# Patient Record
Sex: Female | Born: 1950 | Race: Black or African American | Hispanic: No | Marital: Married | Smoking: Former smoker
Health system: Southern US, Community
[De-identification: ages and names within clinical notes are randomized; demographics above are authoritative.]

## PROBLEM LIST (undated history)

## (undated) DIAGNOSIS — I10 Essential (primary) hypertension: Secondary | ICD-10-CM

## (undated) DIAGNOSIS — Z8711 Personal history of peptic ulcer disease: Secondary | ICD-10-CM

## (undated) DIAGNOSIS — M549 Dorsalgia, unspecified: Secondary | ICD-10-CM

## (undated) DIAGNOSIS — R3915 Urgency of urination: Secondary | ICD-10-CM

## (undated) DIAGNOSIS — R350 Frequency of micturition: Secondary | ICD-10-CM

## (undated) DIAGNOSIS — H269 Unspecified cataract: Secondary | ICD-10-CM

## (undated) DIAGNOSIS — F4310 Post-traumatic stress disorder, unspecified: Secondary | ICD-10-CM

## (undated) DIAGNOSIS — R351 Nocturia: Secondary | ICD-10-CM

## (undated) DIAGNOSIS — R51 Headache: Secondary | ICD-10-CM

## (undated) DIAGNOSIS — E559 Vitamin D deficiency, unspecified: Secondary | ICD-10-CM

## (undated) DIAGNOSIS — K5790 Diverticulosis of intestine, part unspecified, without perforation or abscess without bleeding: Secondary | ICD-10-CM

## (undated) DIAGNOSIS — G894 Chronic pain syndrome: Secondary | ICD-10-CM

## (undated) DIAGNOSIS — C801 Malignant (primary) neoplasm, unspecified: Secondary | ICD-10-CM

## (undated) DIAGNOSIS — E119 Type 2 diabetes mellitus without complications: Secondary | ICD-10-CM

## (undated) DIAGNOSIS — G8929 Other chronic pain: Secondary | ICD-10-CM

## (undated) DIAGNOSIS — M1990 Unspecified osteoarthritis, unspecified site: Secondary | ICD-10-CM

## (undated) DIAGNOSIS — M797 Fibromyalgia: Secondary | ICD-10-CM

## (undated) DIAGNOSIS — Z9289 Personal history of other medical treatment: Secondary | ICD-10-CM

## (undated) DIAGNOSIS — J45909 Unspecified asthma, uncomplicated: Secondary | ICD-10-CM

## (undated) DIAGNOSIS — Z8719 Personal history of other diseases of the digestive system: Secondary | ICD-10-CM

## (undated) DIAGNOSIS — M2550 Pain in unspecified joint: Secondary | ICD-10-CM

## (undated) DIAGNOSIS — K219 Gastro-esophageal reflux disease without esophagitis: Secondary | ICD-10-CM

## (undated) DIAGNOSIS — Z860100 Personal history of colon polyps, unspecified: Secondary | ICD-10-CM

## (undated) DIAGNOSIS — N393 Stress incontinence (female) (male): Secondary | ICD-10-CM

## (undated) DIAGNOSIS — Z8669 Personal history of other diseases of the nervous system and sense organs: Secondary | ICD-10-CM

## (undated) DIAGNOSIS — E785 Hyperlipidemia, unspecified: Secondary | ICD-10-CM

## (undated) DIAGNOSIS — M2540 Effusion, unspecified joint: Secondary | ICD-10-CM

## (undated) DIAGNOSIS — R519 Headache, unspecified: Secondary | ICD-10-CM

## (undated) DIAGNOSIS — G629 Polyneuropathy, unspecified: Secondary | ICD-10-CM

## (undated) DIAGNOSIS — J302 Other seasonal allergic rhinitis: Secondary | ICD-10-CM

## (undated) DIAGNOSIS — J189 Pneumonia, unspecified organism: Secondary | ICD-10-CM

## (undated) DIAGNOSIS — J449 Chronic obstructive pulmonary disease, unspecified: Secondary | ICD-10-CM

## (undated) DIAGNOSIS — M25473 Effusion, unspecified ankle: Secondary | ICD-10-CM

## (undated) DIAGNOSIS — K759 Inflammatory liver disease, unspecified: Secondary | ICD-10-CM

## (undated) DIAGNOSIS — D509 Iron deficiency anemia, unspecified: Secondary | ICD-10-CM

## (undated) HISTORY — PX: ABDOMINAL HYSTERECTOMY: SHX81

## (undated) HISTORY — PX: TUBAL LIGATION: SHX77

## (undated) HISTORY — PX: BREAST SURGERY: SHX581

## (undated) HISTORY — PX: COLONOSCOPY: SHX174

## (undated) HISTORY — PX: OTHER SURGICAL HISTORY: SHX169

---

## 1998-07-29 ENCOUNTER — Emergency Department (HOSPITAL_COMMUNITY): Admission: EM | Admit: 1998-07-29 | Discharge: 1998-07-29 | Payer: Self-pay | Admitting: Emergency Medicine

## 1998-10-05 ENCOUNTER — Ambulatory Visit (HOSPITAL_COMMUNITY): Admission: RE | Admit: 1998-10-05 | Discharge: 1998-10-05 | Payer: Self-pay | Admitting: Family Medicine

## 1999-04-29 ENCOUNTER — Emergency Department (HOSPITAL_COMMUNITY): Admission: EM | Admit: 1999-04-29 | Discharge: 1999-04-29 | Payer: Self-pay | Admitting: Emergency Medicine

## 1999-04-29 ENCOUNTER — Encounter: Payer: Self-pay | Admitting: Emergency Medicine

## 1999-06-13 ENCOUNTER — Ambulatory Visit (HOSPITAL_COMMUNITY): Admission: RE | Admit: 1999-06-13 | Discharge: 1999-06-13 | Payer: Self-pay | Admitting: Internal Medicine

## 1999-06-13 ENCOUNTER — Encounter: Payer: Self-pay | Admitting: Internal Medicine

## 2001-03-23 ENCOUNTER — Emergency Department (HOSPITAL_COMMUNITY): Admission: EM | Admit: 2001-03-23 | Discharge: 2001-03-23 | Payer: Self-pay | Admitting: Emergency Medicine

## 2001-03-23 ENCOUNTER — Encounter: Payer: Self-pay | Admitting: Emergency Medicine

## 2003-03-31 ENCOUNTER — Ambulatory Visit (HOSPITAL_COMMUNITY): Admission: RE | Admit: 2003-03-31 | Discharge: 2003-03-31 | Payer: Self-pay | Admitting: Family Medicine

## 2003-04-29 ENCOUNTER — Encounter: Payer: Self-pay | Admitting: Family Medicine

## 2003-04-29 ENCOUNTER — Encounter: Admission: RE | Admit: 2003-04-29 | Discharge: 2003-04-29 | Payer: Self-pay | Admitting: Family Medicine

## 2003-05-02 ENCOUNTER — Encounter: Payer: Self-pay | Admitting: Family Medicine

## 2003-05-02 ENCOUNTER — Encounter: Admission: RE | Admit: 2003-05-02 | Discharge: 2003-05-02 | Payer: Self-pay | Admitting: Family Medicine

## 2003-05-12 ENCOUNTER — Encounter: Admission: RE | Admit: 2003-05-12 | Discharge: 2003-05-12 | Payer: Self-pay | Admitting: Occupational Medicine

## 2003-05-12 ENCOUNTER — Encounter: Payer: Self-pay | Admitting: Occupational Medicine

## 2004-04-17 ENCOUNTER — Ambulatory Visit (HOSPITAL_COMMUNITY): Admission: RE | Admit: 2004-04-17 | Discharge: 2004-04-17 | Payer: Self-pay | Admitting: Family Medicine

## 2004-04-20 ENCOUNTER — Ambulatory Visit (HOSPITAL_COMMUNITY): Admission: RE | Admit: 2004-04-20 | Discharge: 2004-04-20 | Payer: Self-pay | Admitting: Family Medicine

## 2004-05-03 ENCOUNTER — Ambulatory Visit (HOSPITAL_COMMUNITY): Admission: RE | Admit: 2004-05-03 | Discharge: 2004-05-03 | Payer: Self-pay | Admitting: Family Medicine

## 2004-10-22 ENCOUNTER — Ambulatory Visit: Payer: Self-pay | Admitting: *Deleted

## 2004-11-05 ENCOUNTER — Ambulatory Visit: Payer: Self-pay | Admitting: Family Medicine

## 2004-11-22 ENCOUNTER — Ambulatory Visit: Payer: Self-pay | Admitting: Family Medicine

## 2005-01-09 ENCOUNTER — Ambulatory Visit: Payer: Self-pay | Admitting: Internal Medicine

## 2005-02-15 ENCOUNTER — Ambulatory Visit: Payer: Self-pay | Admitting: Family Medicine

## 2005-02-27 ENCOUNTER — Ambulatory Visit: Payer: Self-pay | Admitting: Family Medicine

## 2005-03-06 ENCOUNTER — Ambulatory Visit: Payer: Self-pay | Admitting: Family Medicine

## 2005-03-07 ENCOUNTER — Ambulatory Visit (HOSPITAL_COMMUNITY): Admission: RE | Admit: 2005-03-07 | Discharge: 2005-03-07 | Payer: Self-pay | Admitting: Family Medicine

## 2005-03-20 ENCOUNTER — Ambulatory Visit: Payer: Self-pay | Admitting: Family Medicine

## 2005-03-28 ENCOUNTER — Ambulatory Visit: Payer: Self-pay | Admitting: Family Medicine

## 2005-04-09 ENCOUNTER — Ambulatory Visit: Payer: Self-pay | Admitting: Family Medicine

## 2005-04-11 ENCOUNTER — Encounter: Admission: RE | Admit: 2005-04-11 | Discharge: 2005-04-11 | Payer: Self-pay | Admitting: Family Medicine

## 2005-05-15 ENCOUNTER — Ambulatory Visit: Payer: Self-pay | Admitting: Family Medicine

## 2005-05-24 ENCOUNTER — Ambulatory Visit: Payer: Self-pay | Admitting: Internal Medicine

## 2005-05-24 ENCOUNTER — Encounter: Payer: Self-pay | Admitting: Cardiology

## 2005-05-24 ENCOUNTER — Inpatient Hospital Stay (HOSPITAL_COMMUNITY): Admission: EM | Admit: 2005-05-24 | Discharge: 2005-05-27 | Payer: Self-pay | Admitting: Emergency Medicine

## 2005-05-25 ENCOUNTER — Ambulatory Visit: Payer: Self-pay | Admitting: Cardiology

## 2005-05-31 ENCOUNTER — Ambulatory Visit: Payer: Self-pay | Admitting: Family Medicine

## 2005-07-13 ENCOUNTER — Emergency Department (HOSPITAL_COMMUNITY): Admission: EM | Admit: 2005-07-13 | Discharge: 2005-07-13 | Payer: Self-pay | Admitting: Family Medicine

## 2005-07-23 ENCOUNTER — Ambulatory Visit: Payer: Self-pay | Admitting: Family Medicine

## 2005-08-26 ENCOUNTER — Emergency Department (HOSPITAL_COMMUNITY): Admission: EM | Admit: 2005-08-26 | Discharge: 2005-08-26 | Payer: Self-pay | Admitting: Family Medicine

## 2005-08-31 ENCOUNTER — Emergency Department (HOSPITAL_COMMUNITY): Admission: EM | Admit: 2005-08-31 | Discharge: 2005-08-31 | Payer: Self-pay | Admitting: Emergency Medicine

## 2005-09-07 ENCOUNTER — Emergency Department (HOSPITAL_COMMUNITY): Admission: EM | Admit: 2005-09-07 | Discharge: 2005-09-07 | Payer: Self-pay | Admitting: Family Medicine

## 2005-12-20 ENCOUNTER — Emergency Department (HOSPITAL_COMMUNITY): Admission: EM | Admit: 2005-12-20 | Discharge: 2005-12-20 | Payer: Self-pay | Admitting: Family Medicine

## 2006-04-21 ENCOUNTER — Emergency Department (HOSPITAL_COMMUNITY): Admission: EM | Admit: 2006-04-21 | Discharge: 2006-04-21 | Payer: Self-pay | Admitting: Emergency Medicine

## 2006-10-15 ENCOUNTER — Emergency Department (HOSPITAL_COMMUNITY): Admission: EM | Admit: 2006-10-15 | Discharge: 2006-10-15 | Payer: Self-pay | Admitting: Emergency Medicine

## 2007-01-13 ENCOUNTER — Ambulatory Visit: Payer: Self-pay | Admitting: Family Medicine

## 2007-01-21 ENCOUNTER — Ambulatory Visit (HOSPITAL_COMMUNITY): Admission: RE | Admit: 2007-01-21 | Discharge: 2007-01-21 | Payer: Self-pay | Admitting: Family Medicine

## 2007-01-22 ENCOUNTER — Ambulatory Visit: Payer: Self-pay | Admitting: Internal Medicine

## 2007-03-16 ENCOUNTER — Ambulatory Visit: Payer: Self-pay | Admitting: Family Medicine

## 2007-04-17 ENCOUNTER — Ambulatory Visit: Payer: Self-pay | Admitting: Family Medicine

## 2007-04-17 ENCOUNTER — Encounter (INDEPENDENT_AMBULATORY_CARE_PROVIDER_SITE_OTHER): Payer: Self-pay | Admitting: Family Medicine

## 2007-05-20 ENCOUNTER — Ambulatory Visit: Payer: Self-pay | Admitting: Family Medicine

## 2007-06-11 ENCOUNTER — Ambulatory Visit: Payer: Self-pay | Admitting: Internal Medicine

## 2007-06-30 ENCOUNTER — Ambulatory Visit: Payer: Self-pay | Admitting: Family Medicine

## 2007-07-28 ENCOUNTER — Ambulatory Visit: Payer: Self-pay | Admitting: Family Medicine

## 2007-08-06 ENCOUNTER — Emergency Department (HOSPITAL_COMMUNITY): Admission: EM | Admit: 2007-08-06 | Discharge: 2007-08-06 | Payer: Self-pay | Admitting: Emergency Medicine

## 2007-08-25 ENCOUNTER — Ambulatory Visit: Payer: Self-pay | Admitting: Family Medicine

## 2007-09-09 ENCOUNTER — Encounter (INDEPENDENT_AMBULATORY_CARE_PROVIDER_SITE_OTHER): Payer: Self-pay | Admitting: *Deleted

## 2007-09-28 ENCOUNTER — Ambulatory Visit: Payer: Self-pay | Admitting: Internal Medicine

## 2007-10-09 ENCOUNTER — Ambulatory Visit (HOSPITAL_COMMUNITY): Admission: RE | Admit: 2007-10-09 | Discharge: 2007-10-09 | Payer: Self-pay | Admitting: Internal Medicine

## 2007-10-26 ENCOUNTER — Ambulatory Visit: Payer: Self-pay | Admitting: Family Medicine

## 2007-11-16 ENCOUNTER — Emergency Department (HOSPITAL_COMMUNITY): Admission: EM | Admit: 2007-11-16 | Discharge: 2007-11-16 | Payer: Self-pay | Admitting: Emergency Medicine

## 2007-11-20 ENCOUNTER — Emergency Department (HOSPITAL_COMMUNITY): Admission: EM | Admit: 2007-11-20 | Discharge: 2007-11-20 | Payer: Self-pay | Admitting: Family Medicine

## 2007-11-23 ENCOUNTER — Ambulatory Visit: Payer: Self-pay | Admitting: Family Medicine

## 2008-01-13 ENCOUNTER — Emergency Department (HOSPITAL_COMMUNITY): Admission: EM | Admit: 2008-01-13 | Discharge: 2008-01-13 | Payer: Self-pay | Admitting: Emergency Medicine

## 2008-01-21 ENCOUNTER — Ambulatory Visit: Payer: Self-pay | Admitting: Internal Medicine

## 2008-01-28 ENCOUNTER — Ambulatory Visit: Payer: Self-pay | Admitting: Internal Medicine

## 2008-04-25 ENCOUNTER — Encounter (INDEPENDENT_AMBULATORY_CARE_PROVIDER_SITE_OTHER): Payer: Self-pay | Admitting: Family Medicine

## 2008-04-25 ENCOUNTER — Ambulatory Visit: Payer: Self-pay | Admitting: Internal Medicine

## 2008-04-25 LAB — CONVERTED CEMR LAB
Benzodiazepines.: NEGATIVE
Creatinine,U: 211.1 mg/dL
Marijuana Metabolite: NEGATIVE
Methadone: NEGATIVE
Opiate Screen, Urine: NEGATIVE
Propoxyphene: NEGATIVE

## 2008-05-02 ENCOUNTER — Emergency Department (HOSPITAL_COMMUNITY): Admission: EM | Admit: 2008-05-02 | Discharge: 2008-05-03 | Payer: Self-pay | Admitting: Emergency Medicine

## 2008-05-12 ENCOUNTER — Encounter (INDEPENDENT_AMBULATORY_CARE_PROVIDER_SITE_OTHER): Payer: Self-pay | Admitting: Family Medicine

## 2008-05-12 ENCOUNTER — Ambulatory Visit: Payer: Self-pay | Admitting: Internal Medicine

## 2008-05-12 LAB — CONVERTED CEMR LAB
AST: 19 units/L (ref 0–37)
Albumin: 4.4 g/dL (ref 3.5–5.2)
Basophils Absolute: 0 10*3/uL (ref 0.0–0.1)
Basophils Relative: 1 % (ref 0–1)
CO2: 27 meq/L (ref 19–32)
Calcium: 9.4 mg/dL (ref 8.4–10.5)
Creatinine, Ser: 0.69 mg/dL (ref 0.40–1.20)
Eosinophils Absolute: 0.1 10*3/uL (ref 0.0–0.7)
Glucose, Bld: 203 mg/dL — ABNORMAL HIGH (ref 70–99)
HCT: 34.6 % — ABNORMAL LOW (ref 36.0–46.0)
Lymphocytes Relative: 44 % (ref 12–46)
MCHC: 32.7 g/dL (ref 30.0–36.0)
Monocytes Relative: 6 % (ref 3–12)
Neutro Abs: 4.1 10*3/uL (ref 1.7–7.7)
Neutrophils Relative %: 48 % (ref 43–77)
Platelets: 275 10*3/uL (ref 150–400)
TSH: 0.766 microintl units/mL (ref 0.350–5.50)
Total CHOL/HDL Ratio: 2.5
VLDL: 13 mg/dL (ref 0–40)

## 2008-05-14 ENCOUNTER — Ambulatory Visit: Payer: Self-pay | Admitting: Surgery

## 2008-05-14 ENCOUNTER — Encounter (INDEPENDENT_AMBULATORY_CARE_PROVIDER_SITE_OTHER): Payer: Self-pay | Admitting: Emergency Medicine

## 2008-05-14 ENCOUNTER — Emergency Department (HOSPITAL_COMMUNITY): Admission: EM | Admit: 2008-05-14 | Discharge: 2008-05-14 | Payer: Self-pay | Admitting: Emergency Medicine

## 2008-07-07 ENCOUNTER — Emergency Department (HOSPITAL_COMMUNITY): Admission: EM | Admit: 2008-07-07 | Discharge: 2008-07-07 | Payer: Self-pay | Admitting: Emergency Medicine

## 2008-07-19 ENCOUNTER — Ambulatory Visit: Payer: Self-pay | Admitting: Internal Medicine

## 2008-07-21 ENCOUNTER — Emergency Department (HOSPITAL_COMMUNITY): Admission: EM | Admit: 2008-07-21 | Discharge: 2008-07-22 | Payer: Self-pay | Admitting: Emergency Medicine

## 2008-09-08 ENCOUNTER — Ambulatory Visit: Payer: Self-pay | Admitting: Internal Medicine

## 2008-09-08 ENCOUNTER — Inpatient Hospital Stay (HOSPITAL_COMMUNITY): Admission: EM | Admit: 2008-09-08 | Discharge: 2008-09-10 | Payer: Self-pay | Admitting: *Deleted

## 2008-09-09 HISTORY — PX: CARDIAC CATHETERIZATION: SHX172

## 2008-09-13 ENCOUNTER — Ambulatory Visit: Payer: Self-pay | Admitting: Family Medicine

## 2008-09-22 ENCOUNTER — Emergency Department (HOSPITAL_COMMUNITY): Admission: EM | Admit: 2008-09-22 | Discharge: 2008-09-22 | Payer: Self-pay | Admitting: Family Medicine

## 2008-10-06 ENCOUNTER — Inpatient Hospital Stay (HOSPITAL_COMMUNITY): Admission: EM | Admit: 2008-10-06 | Discharge: 2008-10-09 | Payer: Self-pay | Admitting: Emergency Medicine

## 2008-10-25 ENCOUNTER — Ambulatory Visit: Payer: Self-pay | Admitting: Family Medicine

## 2008-10-25 LAB — CONVERTED CEMR LAB
Basophils Absolute: 0 10*3/uL (ref 0.0–0.1)
Cholesterol: 203 mg/dL — ABNORMAL HIGH (ref 0–200)
Creatinine, Ser: 0.66 mg/dL (ref 0.40–1.20)
Eosinophils Absolute: 0.2 10*3/uL (ref 0.0–0.7)
Glucose, Bld: 122 mg/dL — ABNORMAL HIGH (ref 70–99)
Hemoglobin: 10.4 g/dL — ABNORMAL LOW (ref 12.0–15.0)
Lymphs Abs: 2.5 10*3/uL (ref 0.7–4.0)
Monocytes Relative: 6 % (ref 3–12)
Neutro Abs: 3.1 10*3/uL (ref 1.7–7.7)
Neutrophils Relative %: 50 % (ref 43–77)
Platelets: 266 10*3/uL (ref 150–400)
RBC: 3.75 M/uL — ABNORMAL LOW (ref 3.87–5.11)
Triglycerides: 63 mg/dL (ref <150)

## 2008-12-06 ENCOUNTER — Emergency Department (HOSPITAL_COMMUNITY): Admission: EM | Admit: 2008-12-06 | Discharge: 2008-12-06 | Payer: Self-pay | Admitting: Emergency Medicine

## 2008-12-23 DIAGNOSIS — J189 Pneumonia, unspecified organism: Secondary | ICD-10-CM

## 2008-12-23 HISTORY — DX: Pneumonia, unspecified organism: J18.9

## 2009-01-09 ENCOUNTER — Emergency Department (HOSPITAL_COMMUNITY): Admission: EM | Admit: 2009-01-09 | Discharge: 2009-01-09 | Payer: Self-pay | Admitting: Family Medicine

## 2009-01-25 ENCOUNTER — Emergency Department (HOSPITAL_COMMUNITY): Admission: EM | Admit: 2009-01-25 | Discharge: 2009-01-25 | Payer: Self-pay | Admitting: Emergency Medicine

## 2009-01-29 IMAGING — CT CT HEAD W/O CM
1 of 2 series · 13 of 30 positions shown, 17 images · non-contrast
Comparison: None

CLINICAL DATA: Weakness.  Spasms of the upper and lower
extremities.  Unsteady gait.  Nausea.  Disorientation.

CT HEAD WITHOUT CONTRAST
TECHNIQUE: Contiguous axial images were obtained from the base of
the skull through the vertex without contrast.

[Series 2: brain · axial · 0.47mm/px · z∈[-146,-16]mm · 13 of 28 slices shown, 17 images]
[im 2/28  brain]
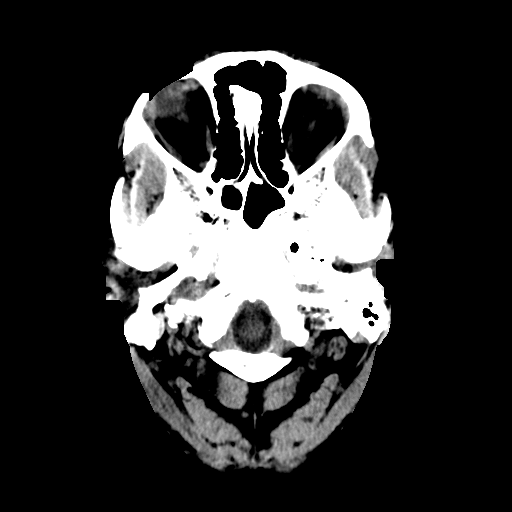
[im 2/28  bone]
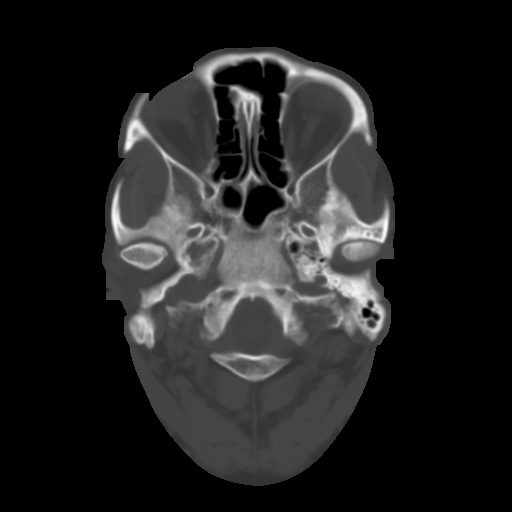
[im 4/28  brain]
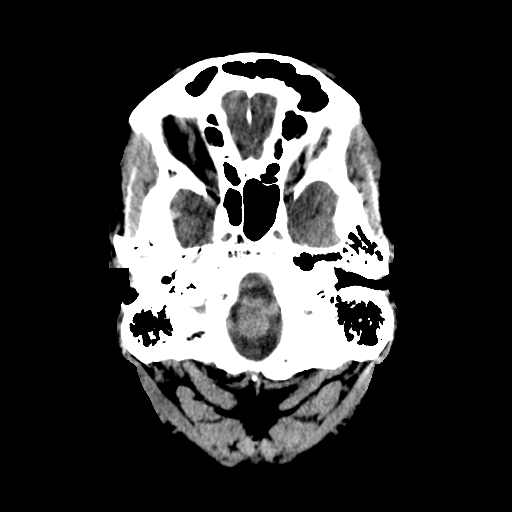
[im 6/28  brain]
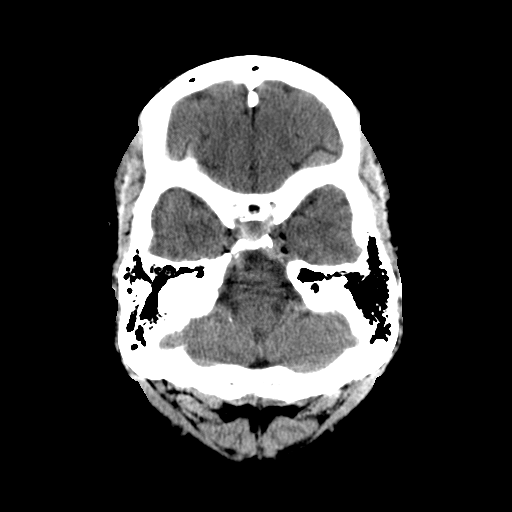
[im 8/28  brain]
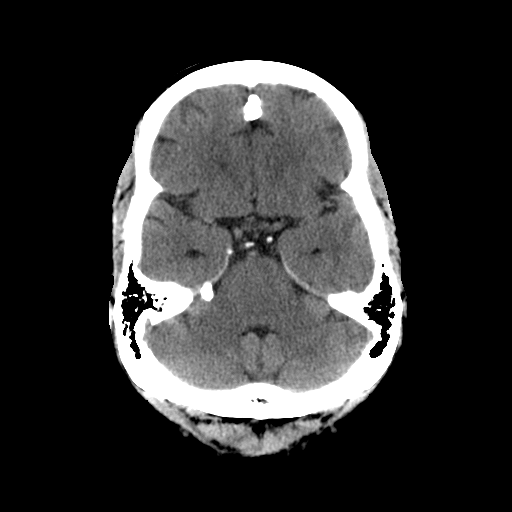
[im 10/28  brain]
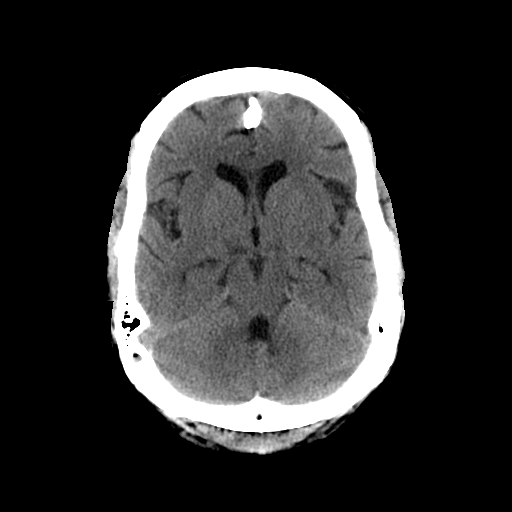
[im 10/28  bone]
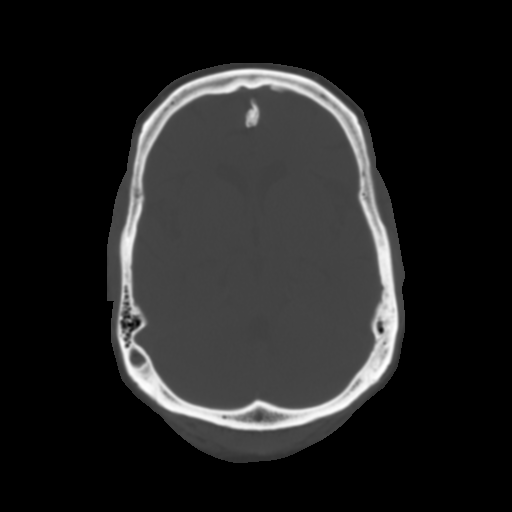
[im 12/28  brain]
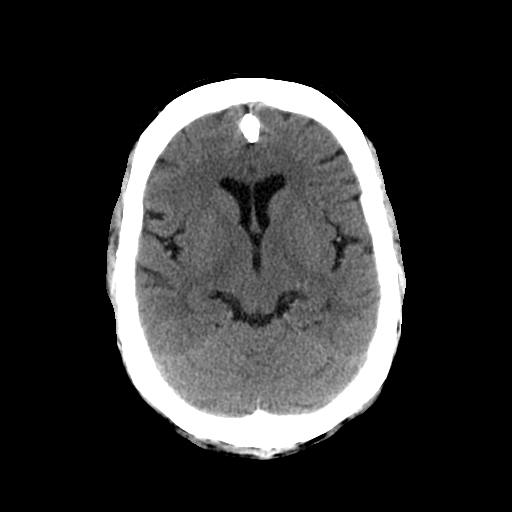
[im 14/28  brain]
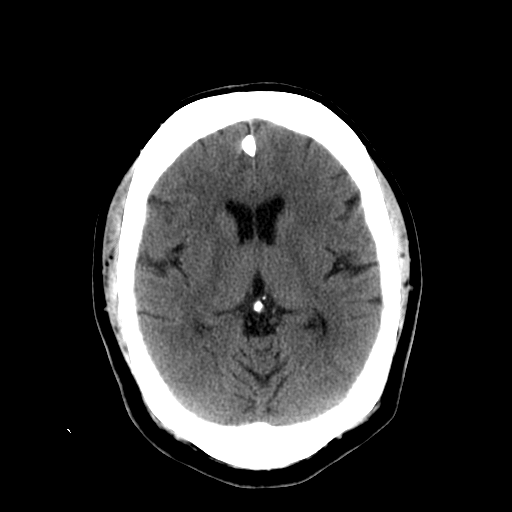
[im 16/28  brain]
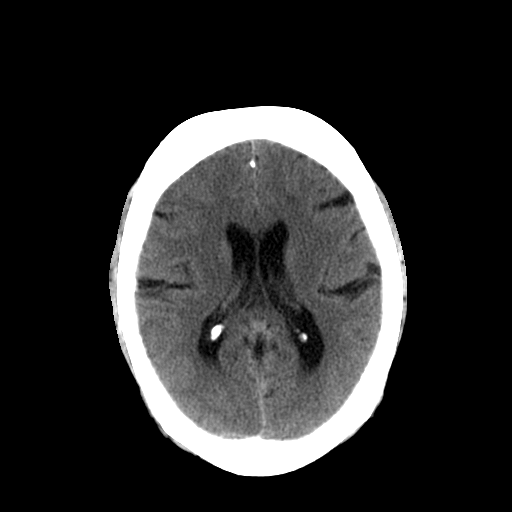
[im 18/28  brain]
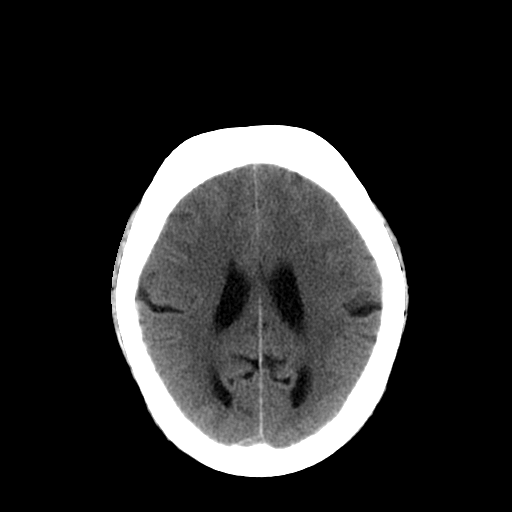
[im 18/28  bone]
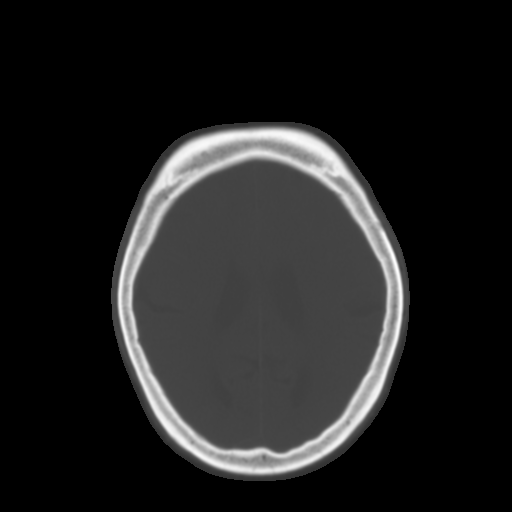
[im 20/28  brain]
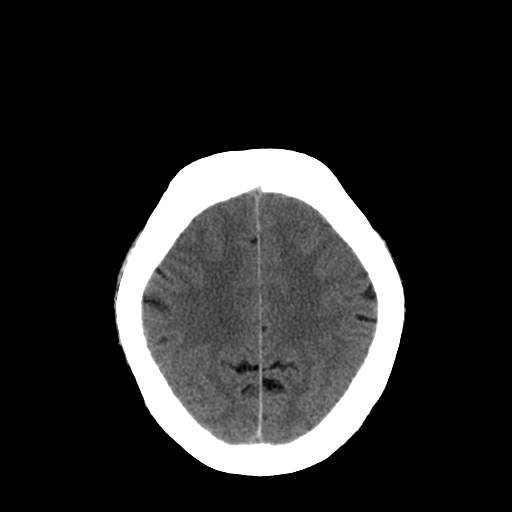
[im 22/28  brain]
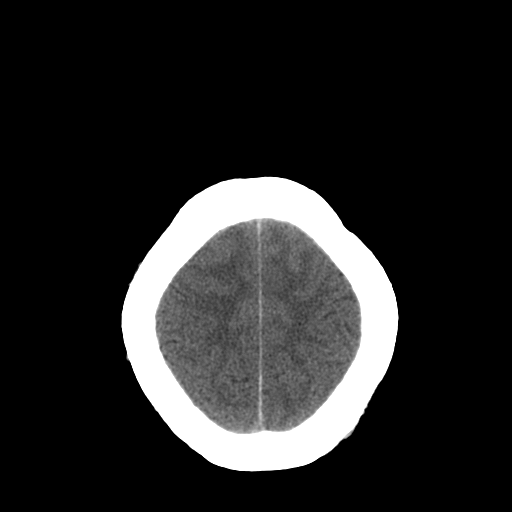
[im 24/28  brain]
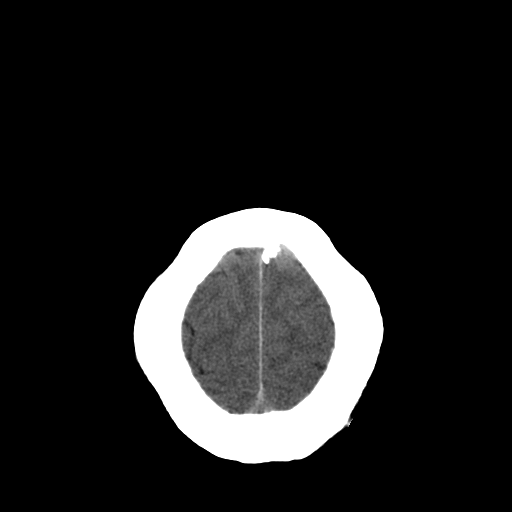
[im 26/28  brain]
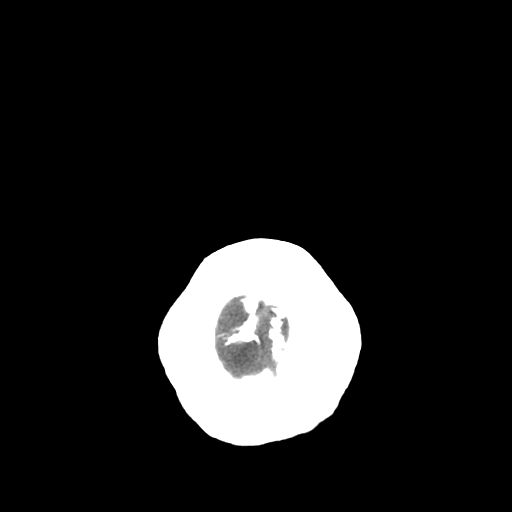
[im 26/28  bone]
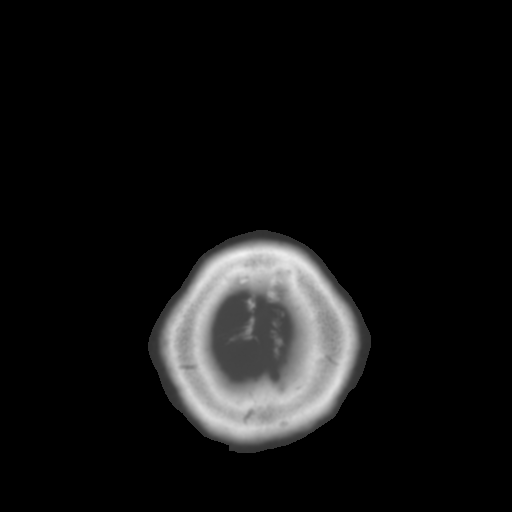

[13 of 30 positions shown; findings below may reference images not displayed]

FINDINGS: The brain stem, cerebellum, cerebral peduncles, thalami,
basal ganglia, basilar cisterns, and ventricular system appear
unremarkable.

There is a relative paucity of sulci at the vertex, but without
findings of sulcal effacement or basilar cisterns effacement
elsewhere in the brain; this appearance of the vertex is likely
incidental.

Ossification noted along the falx anteriorly.

No intracranial hemorrhage, mass lesion, or acute CVA is
identified.

Mucosal thickening in the right sphenoid sinus is compatible with
mild chronic sinusitis.
IMPRESSION: The
1.  Mild chronic sphenoid sinusitis.
2.  No acute intracranial findings are identified. Please note that
acute CVA can be occult on CT scan.

## 2009-01-29 IMAGING — CT CT ABDOMEN W/O CM
2 of 4 series · 14 of 32 positions shown, 19 images · IV contrast (H2O/omni 300)
Comparison: None available

CT ABDOMEN

CLINICAL DATA: acute renal failure, right sided abdominal pain,
sarcoidosis

CT ABDOMEN AND PELVIS WITHOUT CONTRAST
TECHNIQUE: Multidetector CT imaging of the abdomen and pelvis was
performed following the standard protocol without intravenous
contrast.

[Series 2: abd pelvis · axial · 0.98mm/px · z∈[-416,-66]mm · 8 of 91 slices shown, 13 images]
[im 11/91  soft-tissue]
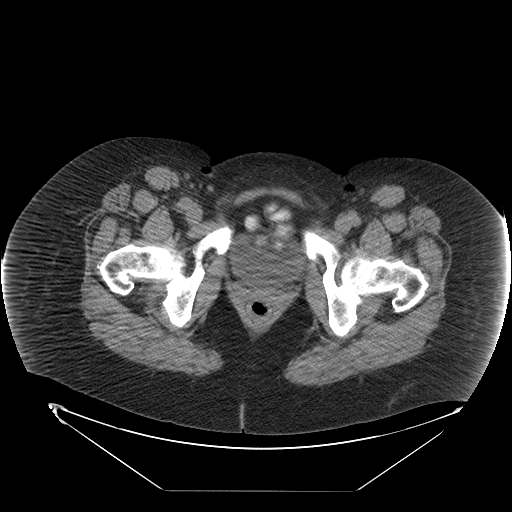
[im 11/91  bone]
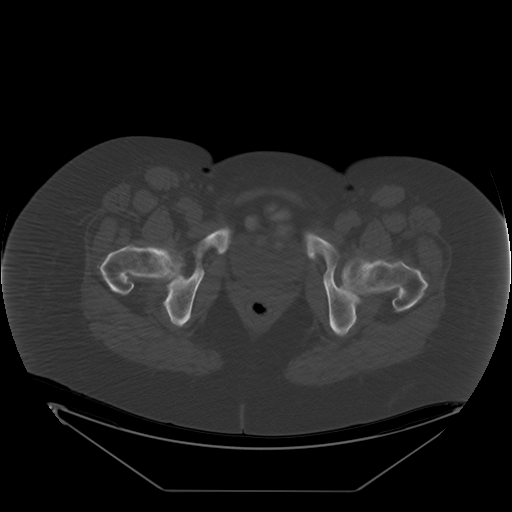
[im 21/91  soft-tissue]
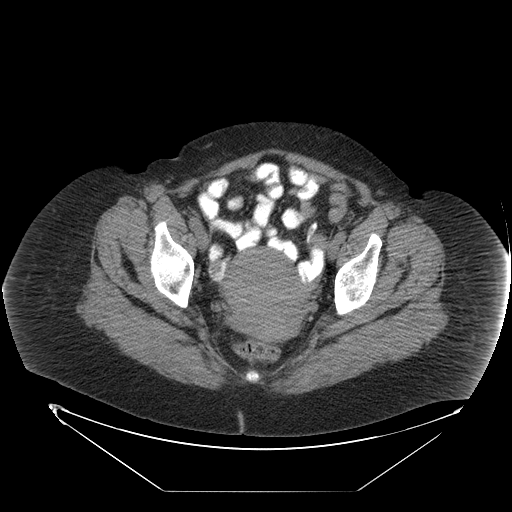
[im 31/91  soft-tissue]
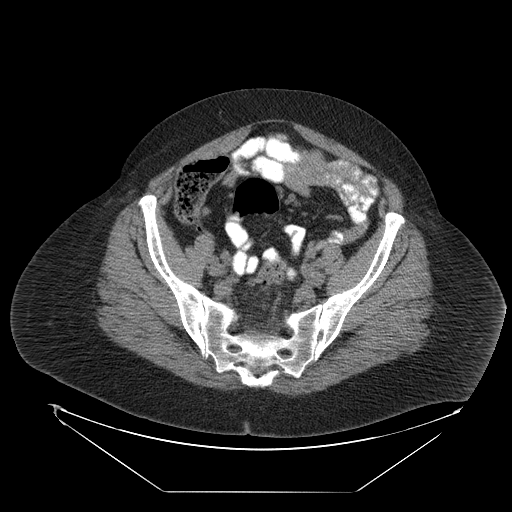
[im 41/91  soft-tissue]
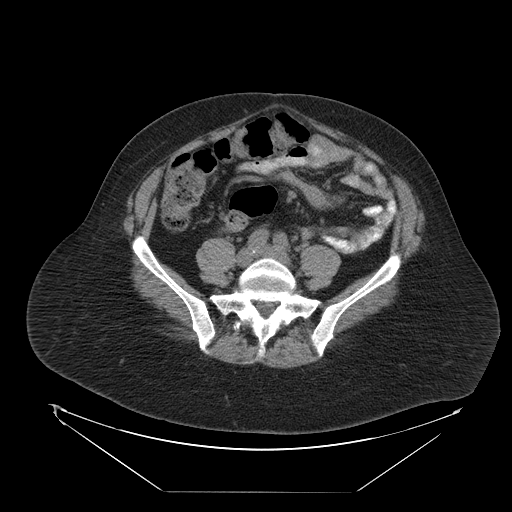
[im 51/91  soft-tissue]
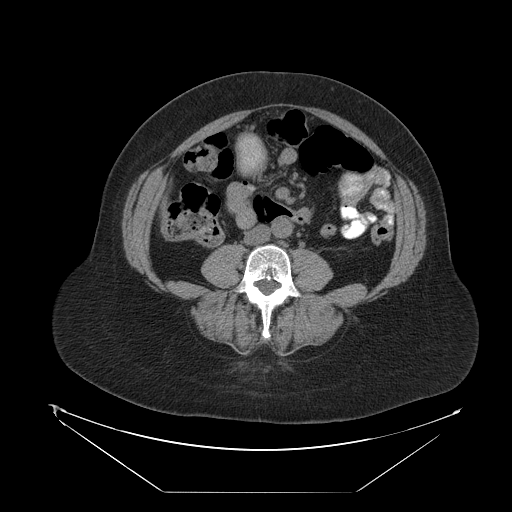
[im 51/91  lung]
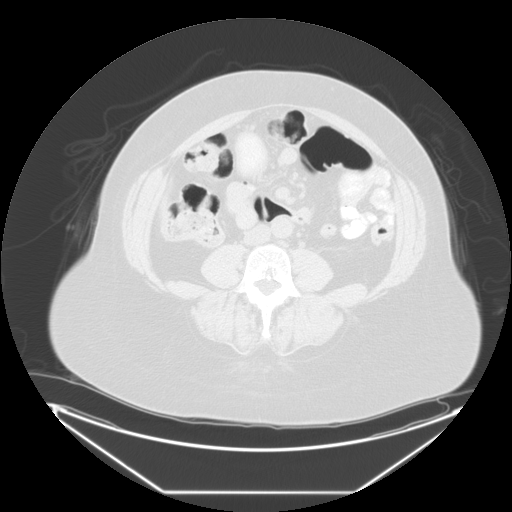
[im 61/91  soft-tissue]
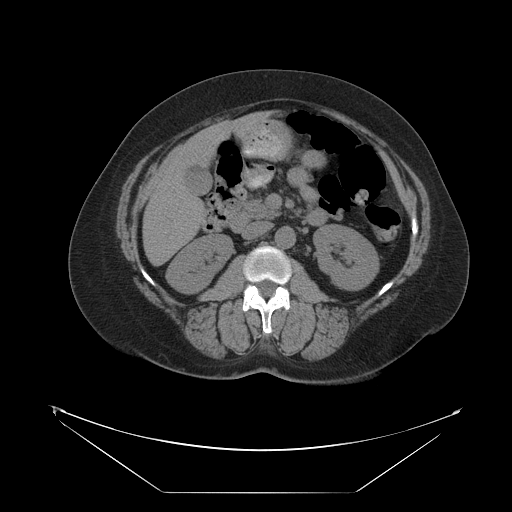
[im 61/91  lung]
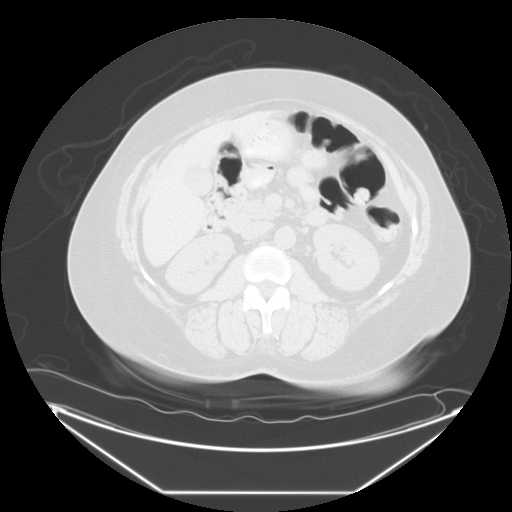
[im 71/91  soft-tissue]
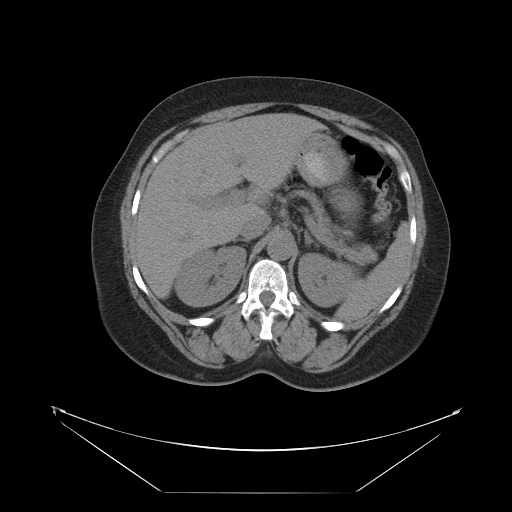
[im 71/91  lung]
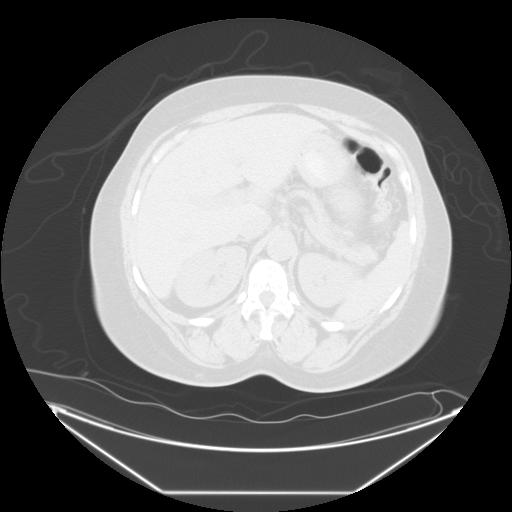
[im 81/91  soft-tissue]
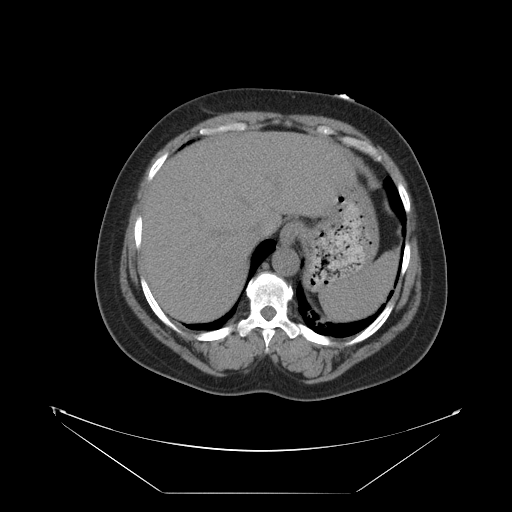
[im 81/91  lung]
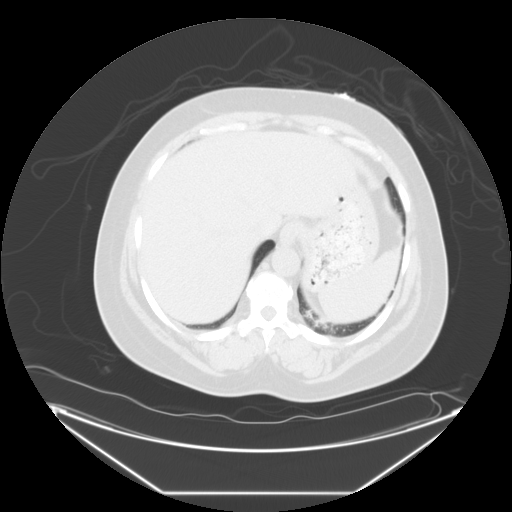

[Series 400: sagittal a/p · sagittal · 0.98mm/px · 6 of 86 slices shown]
[im 10/86  soft-tissue]
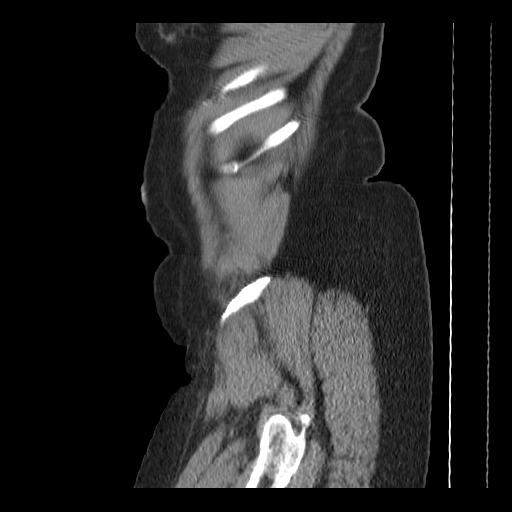
[im 19/86  soft-tissue]
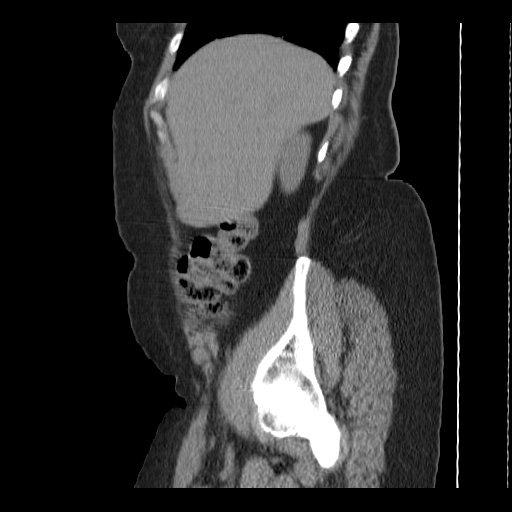
[im 29/86  soft-tissue]
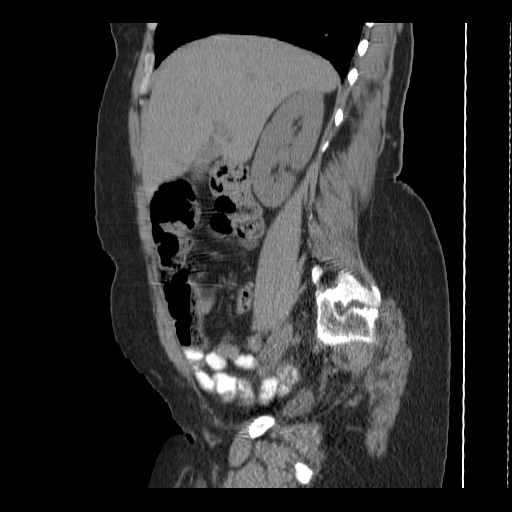
[im 38/86  soft-tissue]
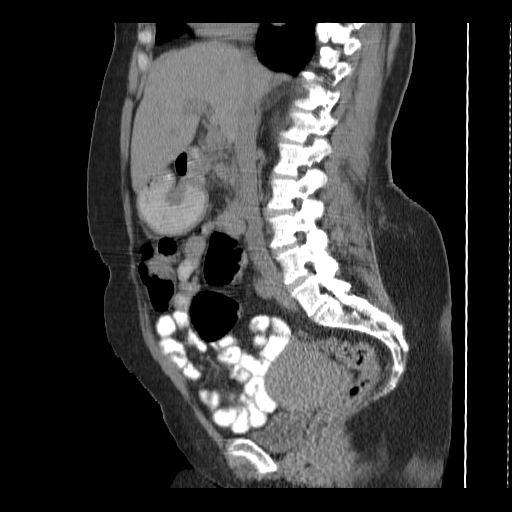
[im 48/86  soft-tissue]
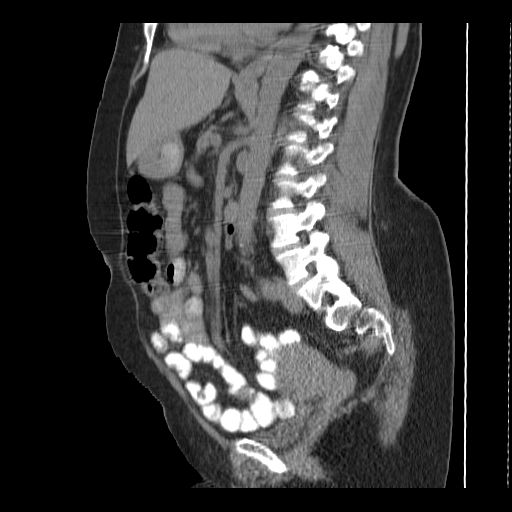
[im 57/86  soft-tissue]
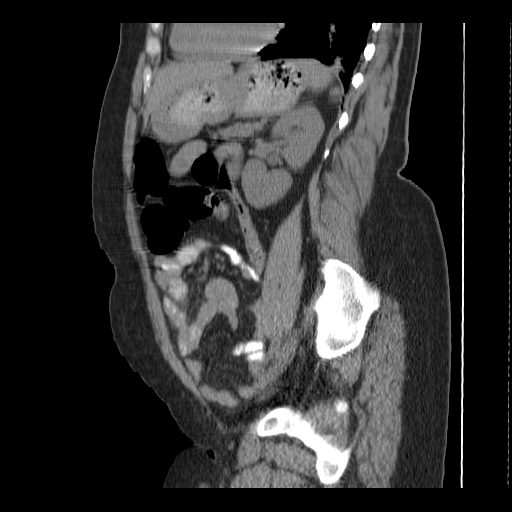

[14 of 32 positions shown; findings below may reference images not displayed]

FINDINGS: Patchy airspace opacities in both of the visualized
lower lobes, left greater than right.  These are slightly increased
on the left, and improved on the right compared to the previous CT
chest of 08/31/2005.  Unremarkable noncontrast evaluation of liver,
gallbladder, spleen, adrenal glands, kidneys, pancreas.  Patchy
calcified plaque is noted in the abdominal aorta without aneurysm.
Small bowel decompressed.  Negative for nephrolithiasis or
hydronephrosis.  No free air.  No adenopathy localized.
IMPRESSION: 1.  Negative for nephrolithiasis or hydronephrosis.
2.  Patchy bilateral lower lobe airspace opacities.

CT PELVIS
FINDINGS: Normal appendix.  The colon is nondilated, unremarkable.
Urinary bladder incompletely distended.  Negative for distal
ureteral calculus.  Globular appearance of the uterine fundus.  No
free fluid.  No adenopathy.
IMPRESSION: 1.  Negative for distal ureteral calculus.
2.  Normal appendix.
3.  Globular morphology of the uterine fundus, suggesting
leiomyomata.  Ultrasound could be used for further evaluation.

## 2009-02-03 ENCOUNTER — Ambulatory Visit: Payer: Self-pay | Admitting: Family Medicine

## 2009-03-30 ENCOUNTER — Emergency Department (HOSPITAL_COMMUNITY): Admission: EM | Admit: 2009-03-30 | Discharge: 2009-03-30 | Payer: Self-pay | Admitting: Emergency Medicine

## 2009-05-09 ENCOUNTER — Encounter (INDEPENDENT_AMBULATORY_CARE_PROVIDER_SITE_OTHER): Payer: Self-pay | Admitting: Internal Medicine

## 2009-05-09 ENCOUNTER — Ambulatory Visit: Payer: Self-pay | Admitting: Family Medicine

## 2009-05-09 LAB — CONVERTED CEMR LAB
Basophils Absolute: 0 10*3/uL (ref 0.0–0.1)
Basophils Relative: 1 % (ref 0–1)
Direct LDL: 59 mg/dL
Eosinophils Relative: 1 % (ref 0–5)
HCT: 36 % (ref 36.0–46.0)
Hemoglobin: 12 g/dL (ref 12.0–15.0)
Lymphocytes Relative: 47 % — ABNORMAL HIGH (ref 12–46)
Lymphs Abs: 3.7 10*3/uL (ref 0.7–4.0)
MCHC: 33.3 g/dL (ref 30.0–36.0)
MCV: 81.3 fL (ref 78.0–100.0)
Microalb, Ur: 3.5 mg/dL — ABNORMAL HIGH (ref 0.00–1.89)
Neutrophils Relative %: 46 % (ref 43–77)
Platelets: 297 10*3/uL (ref 150–400)
RBC: 4.43 M/uL (ref 3.87–5.11)
RDW: 15.3 % (ref 11.5–15.5)
Vit D, 25-Hydroxy: 33 ng/mL (ref 30–89)

## 2009-06-14 ENCOUNTER — Emergency Department (HOSPITAL_COMMUNITY): Admission: EM | Admit: 2009-06-14 | Discharge: 2009-06-14 | Payer: Self-pay | Admitting: Emergency Medicine

## 2009-09-02 ENCOUNTER — Emergency Department (HOSPITAL_COMMUNITY): Admission: EM | Admit: 2009-09-02 | Discharge: 2009-09-02 | Payer: Self-pay | Admitting: Emergency Medicine

## 2009-12-08 ENCOUNTER — Emergency Department (HOSPITAL_COMMUNITY): Admission: EM | Admit: 2009-12-08 | Discharge: 2009-12-08 | Payer: Self-pay | Admitting: Emergency Medicine

## 2010-01-09 ENCOUNTER — Emergency Department (HOSPITAL_COMMUNITY): Admission: EM | Admit: 2010-01-09 | Discharge: 2010-01-09 | Payer: Self-pay | Admitting: Emergency Medicine

## 2010-01-19 ENCOUNTER — Ambulatory Visit: Payer: Self-pay | Admitting: Family Medicine

## 2010-02-05 ENCOUNTER — Emergency Department (HOSPITAL_COMMUNITY): Admission: EM | Admit: 2010-02-05 | Discharge: 2010-02-05 | Payer: Self-pay | Admitting: Emergency Medicine

## 2010-02-12 ENCOUNTER — Emergency Department (HOSPITAL_COMMUNITY): Admission: EM | Admit: 2010-02-12 | Discharge: 2010-02-12 | Payer: Self-pay | Admitting: Emergency Medicine

## 2010-02-27 ENCOUNTER — Ambulatory Visit: Payer: Self-pay | Admitting: Family Medicine

## 2010-03-01 ENCOUNTER — Ambulatory Visit: Payer: Self-pay | Admitting: Family Medicine

## 2010-03-01 LAB — CONVERTED CEMR LAB
AST: 18 units/L (ref 0–37)
Albumin: 4.3 g/dL (ref 3.5–5.2)
Alkaline Phosphatase: 75 units/L (ref 39–117)
Cholesterol: 202 mg/dL — ABNORMAL HIGH (ref 0–200)
HDL: 103 mg/dL (ref >39)
LDL Cholesterol: 85 mg/dL (ref 0–99)
TSH: 0.532 microintl units/mL (ref 0.350–4.500)
Total Bilirubin: 0.3 mg/dL (ref 0.3–1.2)
Total Protein: 7.6 g/dL (ref 6.0–8.3)
Triglycerides: 71 mg/dL (ref <150)

## 2010-03-05 ENCOUNTER — Inpatient Hospital Stay (HOSPITAL_COMMUNITY): Admission: AD | Admit: 2010-03-05 | Discharge: 2010-03-05 | Payer: Self-pay | Admitting: Obstetrics & Gynecology

## 2010-03-20 ENCOUNTER — Emergency Department (HOSPITAL_COMMUNITY): Admission: EM | Admit: 2010-03-20 | Discharge: 2010-03-20 | Payer: Self-pay | Admitting: Emergency Medicine

## 2010-03-21 ENCOUNTER — Ambulatory Visit: Payer: Self-pay | Admitting: Obstetrics and Gynecology

## 2010-03-27 ENCOUNTER — Ambulatory Visit (HOSPITAL_COMMUNITY): Admission: RE | Admit: 2010-03-27 | Discharge: 2010-03-27 | Payer: Self-pay | Admitting: Family Medicine

## 2010-04-11 ENCOUNTER — Emergency Department (HOSPITAL_COMMUNITY): Admission: EM | Admit: 2010-04-11 | Discharge: 2010-04-11 | Payer: Self-pay | Admitting: Family Medicine

## 2010-06-01 ENCOUNTER — Ambulatory Visit: Payer: Self-pay | Admitting: Family Medicine

## 2010-06-21 ENCOUNTER — Ambulatory Visit: Payer: Self-pay | Admitting: Obstetrics and Gynecology

## 2010-09-27 ENCOUNTER — Encounter (INDEPENDENT_AMBULATORY_CARE_PROVIDER_SITE_OTHER): Payer: Self-pay | Admitting: Family Medicine

## 2010-09-27 LAB — CONVERTED CEMR LAB
ALT: 21 units/L (ref 0–35)
BUN: 27 mg/dL — ABNORMAL HIGH (ref 6–23)
CO2: 25 meq/L (ref 19–32)
Calcium: 9.8 mg/dL (ref 8.4–10.5)
Potassium: 4.3 meq/L (ref 3.5–5.3)

## 2010-10-08 ENCOUNTER — Emergency Department (HOSPITAL_COMMUNITY): Admission: EM | Admit: 2010-10-08 | Discharge: 2010-10-08 | Payer: Self-pay | Admitting: Family Medicine

## 2011-01-13 ENCOUNTER — Encounter: Payer: Self-pay | Admitting: Family Medicine

## 2011-03-01 ENCOUNTER — Inpatient Hospital Stay (INDEPENDENT_AMBULATORY_CARE_PROVIDER_SITE_OTHER)
Admission: RE | Admit: 2011-03-01 | Discharge: 2011-03-01 | Disposition: A | Discharge status: Home / Self Care | Payer: Medicaid Other | Source: Ambulatory Visit | Attending: Family Medicine | Admitting: Family Medicine

## 2011-03-01 DIAGNOSIS — K219 Gastro-esophageal reflux disease without esophagitis: Secondary | ICD-10-CM

## 2011-03-01 DIAGNOSIS — J45909 Unspecified asthma, uncomplicated: Secondary | ICD-10-CM

## 2011-03-10 LAB — POCT URINALYSIS DIP (DEVICE)
Bilirubin Urine: NEGATIVE
Glucose, UA: NEGATIVE mg/dL
Hgb urine dipstick: NEGATIVE
Specific Gravity, Urine: 1.015 (ref 1.005–1.030)
Urobilinogen, UA: 1 mg/dL (ref 0.0–1.0)

## 2011-03-10 LAB — WET PREP, GENITAL
Trich, Wet Prep: NONE SEEN
Yeast Wet Prep HPF POC: NONE SEEN

## 2011-03-10 LAB — GC/CHLAMYDIA PROBE AMP, GENITAL
Chlamydia, DNA Probe: NEGATIVE
GC Probe Amp, Genital: NEGATIVE

## 2011-03-12 ENCOUNTER — Inpatient Hospital Stay (INDEPENDENT_AMBULATORY_CARE_PROVIDER_SITE_OTHER)
Admission: RE | Admit: 2011-03-12 | Discharge: 2011-03-12 | Disposition: A | Discharge status: Home / Self Care | Payer: Medicaid Other | Source: Ambulatory Visit | Attending: Family Medicine | Admitting: Family Medicine

## 2011-03-12 ENCOUNTER — Ambulatory Visit (INDEPENDENT_AMBULATORY_CARE_PROVIDER_SITE_OTHER): Payer: Medicaid Other

## 2011-03-12 DIAGNOSIS — J019 Acute sinusitis, unspecified: Secondary | ICD-10-CM

## 2011-03-12 LAB — WET PREP, GENITAL: Clue Cells Wet Prep HPF POC: NONE SEEN

## 2011-03-12 LAB — POCT URINALYSIS DIP (DEVICE)
Glucose, UA: NEGATIVE mg/dL
Ketones, ur: NEGATIVE mg/dL

## 2011-03-12 LAB — GC/CHLAMYDIA PROBE AMP, GENITAL
Chlamydia, DNA Probe: NEGATIVE
GC Probe Amp, Genital: NEGATIVE

## 2011-03-18 LAB — WET PREP, GENITAL
Clue Cells Wet Prep HPF POC: NONE SEEN
Trich, Wet Prep: NONE SEEN

## 2011-03-29 LAB — URINALYSIS, ROUTINE W REFLEX MICROSCOPIC
Bilirubin Urine: NEGATIVE
Glucose, UA: NEGATIVE mg/dL
Nitrite: NEGATIVE
Protein, ur: NEGATIVE mg/dL
Specific Gravity, Urine: 1.028 (ref 1.005–1.030)
Urobilinogen, UA: 0.2 mg/dL (ref 0.0–1.0)
pH: 5.5 (ref 5.0–8.0)

## 2011-04-09 LAB — URINALYSIS, ROUTINE W REFLEX MICROSCOPIC
Hgb urine dipstick: NEGATIVE
Ketones, ur: 15 mg/dL — AB
Protein, ur: 30 mg/dL — AB

## 2011-04-09 LAB — BASIC METABOLIC PANEL
BUN: 17 mg/dL (ref 6–23)
Calcium: 9.4 mg/dL (ref 8.4–10.5)
Creatinine, Ser: 1.09 mg/dL (ref 0.4–1.2)
GFR calc non Af Amer: 52 mL/min — ABNORMAL LOW (ref >60)
Glucose, Bld: 120 mg/dL — ABNORMAL HIGH (ref 70–99)
Potassium: 3.7 mEq/L (ref 3.5–5.1)
Sodium: 138 mEq/L (ref 135–145)

## 2011-04-19 ENCOUNTER — Other Ambulatory Visit (HOSPITAL_COMMUNITY): Payer: Self-pay | Admitting: Family Medicine

## 2011-04-19 DIAGNOSIS — Z1231 Encounter for screening mammogram for malignant neoplasm of breast: Secondary | ICD-10-CM

## 2011-04-20 ENCOUNTER — Inpatient Hospital Stay (HOSPITAL_COMMUNITY): Payer: Medicaid Other

## 2011-04-20 ENCOUNTER — Inpatient Hospital Stay (HOSPITAL_COMMUNITY)
Admission: AD | Admit: 2011-04-20 | Discharge: 2011-04-20 | Disposition: A | Discharge status: Home / Self Care | Payer: Medicaid Other | Source: Ambulatory Visit | Attending: Family Medicine | Admitting: Family Medicine

## 2011-04-20 DIAGNOSIS — D259 Leiomyoma of uterus, unspecified: Secondary | ICD-10-CM | POA: Insufficient documentation

## 2011-04-20 DIAGNOSIS — R109 Unspecified abdominal pain: Secondary | ICD-10-CM

## 2011-04-20 LAB — URINALYSIS, ROUTINE W REFLEX MICROSCOPIC
Glucose, UA: NEGATIVE mg/dL
Protein, ur: NEGATIVE mg/dL
pH: 5.5 (ref 5.0–8.0)

## 2011-04-20 LAB — CBC
HCT: 34.9 % — ABNORMAL LOW (ref 36.0–46.0)
Hemoglobin: 11.5 g/dL — ABNORMAL LOW (ref 12.0–15.0)
RDW: 14.9 % (ref 11.5–15.5)
WBC: 5.8 10*3/uL (ref 4.0–10.5)

## 2011-04-20 LAB — RAPID URINE DRUG SCREEN, HOSP PERFORMED
Cocaine: NOT DETECTED
Opiates: POSITIVE — AB
Tetrahydrocannabinol: NOT DETECTED

## 2011-04-29 ENCOUNTER — Ambulatory Visit (HOSPITAL_COMMUNITY)
Admission: RE | Admit: 2011-04-29 | Discharge: 2011-04-29 | Disposition: A | Discharge status: Home / Self Care | Payer: Medicaid Other | Source: Ambulatory Visit | Attending: Family Medicine | Admitting: Family Medicine

## 2011-04-29 DIAGNOSIS — Z1231 Encounter for screening mammogram for malignant neoplasm of breast: Secondary | ICD-10-CM | POA: Insufficient documentation

## 2011-04-30 ENCOUNTER — Other Ambulatory Visit: Payer: Self-pay | Admitting: Family Medicine

## 2011-04-30 DIAGNOSIS — R928 Other abnormal and inconclusive findings on diagnostic imaging of breast: Secondary | ICD-10-CM

## 2011-05-07 ENCOUNTER — Ambulatory Visit
Admission: RE | Admit: 2011-05-07 | Discharge: 2011-05-07 | Disposition: A | Discharge status: Home / Self Care | Payer: Medicaid Other | Source: Ambulatory Visit | Attending: Family Medicine | Admitting: Family Medicine

## 2011-05-07 DIAGNOSIS — R928 Other abnormal and inconclusive findings on diagnostic imaging of breast: Secondary | ICD-10-CM

## 2011-05-07 NOTE — H&P (Signed)
NAMEFREADA, [name] NO.:  [id number]   MEDICAL RECORD NO.:  [id number]          PATIENT TYPE:  INP   LOCATION:  2023                         FACILITY:  [facility]   PHYSICIAN:  Eduard Clos, MDDATE OF BIRTH:  06-02-1951   DATE OF ADMISSION:  10/06/2008  DATE OF DISCHARGE:                              HISTORY & PHYSICAL   PRIMARY CARE PHYSICIAN:  [facility].   CHIEF COMPLAINT:  Generalized weakness.   HISTORY OF PRESENT ILLNESS:  A 60 year old female with a history of  sarcoidosis, hypertension, diabetes mellitus type 2, hyperlipidemia who  was discharged last month, September 19, after being worked up for chest  pain with cardiac catheterization showing normal coronary arteries and  who also had angioedema from lisinopril.  Presently, in the ER  complaining of generalized weakness over the last two days.  The patient  had a creatinine level of 2.9 which was significantly increased from  baseline of 0.6 when she was discharged last month.  The patient states  she has been feeling generalized weakness over the last two days with  some nausea and had vomited once.  Denies any diarrhea.  Feels her belly  is distended, and generalized discomfort.  She did have some pain two  days ago in the infraumbilical area which has resolved now.  The patient  did not have any specific chest pain, but did have some wheezing with  his cough and productive sputum.  Denies fevers, chills, headache,  dizziness, loss of consciousness.   PAST MEDICAL HISTORY:  1. Hypertension.  2. Diabetes mellitus type 2.  3. Recent angioedema from ACE inhibitors.  4. Sarcoidosis.  5. COPD.  6. Hyperlipidemia.   PAST SURGICAL HISTORY:  Tubal ligation.   MEDICATIONS PRIOR TO ADMISSION:  1. Albuterol HFA two puffs q.6 h p.r.n.  2. Atenolol 50 mg p.o. daily.  3. Cymbalta 60 mg p.o. q.h.s.  4. Flovent 110 mcg one puff b.i.d.  5. Glucotrol 10 mg p.o. daily.  6. Metformin 500 mg p.o.  b.i.d.  7. Neurontin 600 mg p.o. b.i.d.  8. Nexium 40 mg p.o. daily.  9. Claritin 10 mg p.o. daily.  10.Maxzide 37.5 mg p.o. daily.  11.Percocet 7.5 mg p.o. q.6 h p.r.n.  12.Zocor 40 mg p.o. daily.  13.OxyContin 50 mg p.o. t.i.d. p.r.n.  14.OxyContin 20 mg p.o. daily.  15.Prednisone 30 mg p.o. daily.  16.Avapro 115 mg daily.   ALLERGIES:  ACE inhibitor causes angioedema.   SOCIAL HISTORY:  The patient denies smoking cigarettes, drinking alcohol  or using illegal drugs now.  Previously used to use.   FAMILY HISTORY:  Noncontributory.   REVIEW OF SYSTEMS:  As per history of present illness, nothing else  significant.   PHYSICAL EXAMINATION:  GENERAL:  Patient examined at bedside.  Not in  acute distress.  VITAL SIGNS:  Blood pressure 88/50, pulse 77 per minute, temperature  97.9, respirations 18 per minute, O2 saturation 97% on room air.  HEENT:  Nonicteric no pallor.  CHEST:  Bilateral air entry present.  No rhonchi.  No crepitation.  HEART:  S1 S2 heard.  ABDOMEN:  Soft,  nontender.  Mildly distended.  Bowel sounds heard.  No  guarding or rigidity.  CNS:  Alert and oriented to time, place and person.  Moves upper and  lower extremities 5/5.  EXTREMITIES:  Peripheral pulses felt.  No edema.   LABORATORY DATA:  CT of the head without contrast showed no acute  findings.  Chest x-ray:  Cardiomegaly with low lung volumes, probably  mild venous congestion.  CBC:  WBC 9.5, hemoglobin 10.8, hematocrit  31.9, neutrophils 28.  Comprehensive metabolic panel:  Sodium 133,  potassium 3.9, chloride 96, CO2 27, glucose 144, BUN 20, creatinine 2.9,  total bilirubin 0.6, alk-phos 87, AST 22, ALT 23, total protein 6.4,  albumin 3.8, calcium 9.1, on admission 1.8.  CK 164.  MB 4.1, troponin I  0.07, repeat is 11.05.  UA with small bilirubin, ketones 15, but  negative blood, nitrites negative, leukocytes. negative.  WBC 8-6,  bacteria few.   ASSESSMENT:  1. Acute renal failure, probably  from dehydration.  History of recent      cardiac catheterization.  The patient is also on ACE inhibitors and      diuretics.  2. Generalized weakness and fatigue from dehydration.  3. Abdominal pain, nonspecific.  4. Hypertension.  5. Diabetes mellitus type 2.  6. Anemia.  7. Chronic pain.  8. History of sarcoidosis.  9. Hyperlipidemia.   PLAN:  Admit the patient to telemetry.  Will continue the patient on IV  fluids.  Will get sonogram of the kidneys and CT abdomen without  contrast.  Have urine cultures.  Will start the patient on empiric  antibiotics for possible UTI.  Will resume most of home medications and  hold off diuretics and Avapro.  Place patient on Accu-Chek's and further  recommendations as condition evolves.      Eduard Clos, MD  Electronically Signed     ANK/MEDQ  D:  10/06/2008  T:  10/06/2008  Job:  [id number]

## 2011-05-07 NOTE — H&P (Signed)
[name], [name] NO.:  [id number]   MEDICAL RECORD NO.:  [id number]          PATIENT TYPE:  INP   LOCATION:  3707                         FACILITY:  [facility]   PHYSICIAN:  Herbie Saxon, MDDATE OF BIRTH:  1951/09/24   DATE OF ADMISSION:  09/08/2008  DATE OF DISCHARGE:                              HISTORY & PHYSICAL   She is a full code.  There is no health care power of attorney assigned.   PRESENTING COMPLAINT:  Chest pain and light tongue, body swelling, one  day.   HISTORY OF PRESENTING COMPLAINT:  This 60 year old African American was  quite well until 30 minutes prior to presenting to the emergency room  when she woke up at 2:30 a.m. with chest pain, dull in nature, 6/10 in  severity, nonradiating.  No associated shortness of breath, diaphoresis,  or lightheadedness.  No palpitation and no dizziness.  The patient also  prior to sleeping had noticed swelling of her knees and hands, but she  woke up with facial and tongue swelling.  She denies any difficulty with  swallowing.  The patient claims she has had a history of throat  swelling, previously treated with lisinopril, but she was restarted on  this.  She denies any generalized pruritus.  The patient also claims  that she has been bloated for the last two days with constipation and  gas with increased reflux symptoms.  She denies any abdominal pain or  abdominal distention.  No diarrhea, constipation, hematemesis, or  melena.  No jaundice.  There are no symptoms referable to the  genitourinary system.   PAST MEDICAL HISTORY:  1. Diabetes type 2.  2. Hypertension.  3. Sarcoidosis.  4. Uveitis.  5. Hiatal hernia.  6. Glaucoma.  7. Chronic back pain.  8. Fibromyalgia.  9. Gastroesophageal reflux disease.  10.Anemia.  11.Allergic rhinitis.  12.She is a former polysubstance abuser.  13.Atypical angina, negative stress test in 2006.  14.Hyperlipidemia.   SOCIAL HISTORY:  She is separated.   Former alcohol, tobacco, and drug  abuser.  She claims last use of substance use was more than 15 years  ago.   PAST MEDICAL HISTORY:  Noncontributory.   FAMILY HISTORY:  Mother had hypertension and cancer of the uterus.  Sister had myocardial infarction.   REVIEW OF SYSTEMS:  Fourteen point review of systems, none pertinent  positive on the history of presenting complaint.   MEDICATIONS:  1. Albuterol 1 puff p.r.n.  2. Percocet 7.5 mg p.r.n.  3. Claritin 10 mg daily.  4. Atenolol 50 mg daily.  5. Cymbalta 60 mg nightly.  6. Maxzide 37.5 mg daily.  7. Flovent 1 puff b.i.d.  8. Nitroglycerin 0.4 mg sublingual p.r.n.  9. Glucotrol 5 mg daily.  10.Lisinopril 10 mg daily.  11.Metformin 500 mg b.i.d.  12.Neurontin 600 mg b.i.d.  13.Nexium 20 mg daily.   ALLERGIES:  No known drug allergies.   PHYSICAL EXAMINATION:  GENERAL:  This is a middle-aged lady, not in  acute respiratory distress.  VITAL SIGNS:  Temperature 98, pulse is 80, respiratory rate is 20, and  blood pressure 140/80.  HEENT:  Pupils are equal, reactive to light and accommodation.  Head is  atraumatic and normocephalic.  Oropharynx and nasopharynx are clear.  NECK:  Supple.  There is no adenopathy, no thyromegaly, and no carotid  bruits.  No jugular venous distention.  No chest wall tenderness.  CHEST:  Clinically, clear.  No rales or rhonchi.  HEART:  Heart sounds S1, S2.  Regular rate and rhythm.  ABDOMEN:  Soft and nontender.  No organomegaly.  Bowel sounds are  normal.  NEUROLOGIC:  She is alert and oriented to time, place, and person.  Cranial nerves II through XII intact.  Power and sensory system grossly  normal.  Peripheral pulses present.  No pedal edema.   LABORATORY DATA:  WBC is 8.6, hematocrit 30, and platelet count 228.  Chemistry shows a sodium of 140, potassium 2.9, chloride 103,  bicarbonate 30, glucose 144, BUN 17, and creatinine 0.6.  Troponin less  than 0.05.  Chest x-ray shows cardiomegaly  with vascular congestion.  No  overt edema.  EKG shows normal sinus rhythm at 64 per minute.   ASSESSMENT:  1. Atypical chest pain rule out acute coronary syndrome.  2. Angioedema likely secondary to ACE inhibitor.  3. Gastroesophageal reflux disease.  4. Hiatal hernia.  5. Gastritis.  6. Anemia of chronic disease.   PLAN:  The patient is to be admitted for observation to a telemetry bed.  We will get serial cardiac enzymes q.8 h. x3.  Discontinue her  lisinopril, start on IV Pepcid and Solu-Medrol.  She was to be on  morphine 2 mg IV p.r.n., nitroglycerin sublingual p.r.n., oxygen 2 L  nasal cannula, and aspirin 325 mg daily.  She will also be on Lovenox 30  mg subcutaneous daily for deep venous thrombosis prophylaxis.  Albuterol  and Atrovent 1 unit dose q.6 h. p.r.n.  Activity, bed rest.  Diet to be  1800-calorie ADA heart healthy.  IV saline lock.  Continue to have  previous home medications.      Herbie Saxon, MD  Electronically Signed     MIO/MEDQ  D:  09/08/2008  T:  09/08/2008  Job:  [id number]

## 2011-05-07 NOTE — Discharge Summary (Signed)
NAMEBERKLEY, WRIGHTSMAN NO.:  [id number]   MEDICAL RECORD NO.:  [id number]          PATIENT TYPE:  OBV   LOCATION:  3707                         FACILITY:  [facility]   PHYSICIAN:  Altha Harm, MDDATE OF BIRTH:  04-Nov-1951   DATE OF ADMISSION:  09/08/2008  DATE OF DISCHARGE:  09/10/2008                               DISCHARGE SUMMARY   PRIMARY CARE PHYSICIAN:  Dr. Audria Nine at [facility].   DISCHARGE DISPOSITION:  Home.   FINAL DISCHARGE DIAGNOSES:  1. Chest pain, noncardiac.  2. Diabetes, type 2, uncontrolled.  3. Hypertension.  4. Angioedema secondary to angiotensin-converting enzyme inhibitor.  5. History of depression.  6. History of gastroesophageal reflux disease.  7. History of chronic obstructive pulmonary disease/asthma.   CONSULTANT:  Dr. Ladona Ridgel, [facility]   PROCEDURE:  Left heart catheterization which shows normal coronary  arteries.   RADIOLOGIC STUDIES:  Portable chest x-ray done on admission which shows  cardiomegaly and vascular congestion, no overt edema.   ALLERGIES:  ACE INHIBITORS causing angioedema.   CODE STATUS:  Full code.   CHIEF COMPLAINT:  Chest pain.   HISTORY OF PRESENT ILLNESS:  Please refer to the H and P dictated by Dr.  Christella Noa for details of the HPI.   HOSPITAL COURSE:  The patient was admitted secondary to chest pain and  the angioedema.  For the chest pain, the patient was ruled out with  serial enzymes.  The patient had had a normal stress test in 2006.  However, given the patient's risk factors and her typical and atypical  features of the chest pain, cardiology was consulted, and a cardiac cath  was performed.  The cardiac cath shows normal coronary arteries, normal  left ventricular systolic function.  However, it did show elevated end-  diastolic pressures of the left ventricle.   The patient has some chronic renal insufficiency secondary to her  diabetes.  She did have a contrast use for the  cardiac cath study.   RECOMMENDATIONS:  1. She is on support with diuretic therapy which she was on prior to      hospitalization.  2. Angioedema.  The patient presented with angioedema swelling of the      tongue.  With steroids, Benadryl and proton pump inhibitor, the      symptoms resolved.  The patient states that she had an episode of      this in the past, and the recommendation is that patient should not      be put back on an ACE inhibitor.  Thus, the H2 receptor-blocker,      Avapro, should substituted in its place.  Please note that there is      some cross reactivity for this, and the patient has been cautioned      that should she have any symptoms similar to this, she needs to      report to the emergency and report this to her physician. Patient      is being sent home ona burst of prednisone to complete her      treatment.  3. Diabetes, type 2.  The patient's hemoglobin A1c was 9.4 which      indicates rather poor control of diabetes.  Her blood sugars here      were adequately controlled.  As a matter of fact, the patient had      some hypoglycemia with Lantus.  However, I felt that this was a      response to the fact that the patient had not eaten during the day      she was n.p.o. for her cardiac cath study.  The patient will need      aggressive titration as an outpatient.  The patient had a contrast      study on September 09, 2008, and she should be without her      metformin for at least 48 hours; she is to resume her metformin on      September 12, 2008.  In terms of her hypertension, it was under      very good control, and, as noted before, the patient will be placed      on Avapro in place of lisinopril.  Otherwise, the patient has      remained stable.  4. Hyperlipidemia: Pt was started on Zocor and should have her      cholesterol reevaluated in 6 weeks for further titiration of      medication.   DISCHARGE MEDICATIONS:  Her discharge medications include  the following:  1. Albuterol 2 puffs p.o. p.r.n.  2. Atenolol 50 mg p.o. daily.  3. Cymbalta 60 mg p.o. nightly.  4. Flovent 1 puff p.o. b.i.d.  5. Glucotrol 5 mg p.o. daily.  6. Neurontin 600 mg p.o. b.i.d.  7. Nexium 20 mg p.o. daily.  8. Claritin 10 mg p.o. daily.  9. Maxzide 37.5/25 mg p.o. daily.  10.Percocet 7.5 mg p.o. q.6 h. p.r.n.  11.Avapro 150 mg p.o. daily.  12.Metformin 500 mg p.o. daily to be restarted on September 12, 2008.  13 Prednisone 60 mg p.o. daily  14 Zocor 40 mg p.o. q. h.s.   DISCONTINUED MEDICATIONS:  Lisinopril 10 mg p.o. daily.   DIETARY RESTRICTIONS:  The patient should be on a 2 gram sodium diabetic  diet.   PHYSICAL RESTRICTIONS:  None.      Altha Harm, MD  Electronically Signed     MAM/MEDQ  D:  09/10/2008  T:  09/10/2008  Job:  [id number]   cc:   Maurice March, M.D.

## 2011-05-07 NOTE — Cardiovascular Report (Signed)
NAMEFIFI, [name] NO.:  [id number]   MEDICAL RECORD NO.:  [id number]          PATIENT TYPE:  OBV   LOCATION:  3707                         FACILITY:  [facility]   PHYSICIAN:  Verne Carrow, MDDATE OF BIRTH:  1951/11/06   DATE OF PROCEDURE:  09/09/2008  DATE OF DISCHARGE:                            CARDIAC CATHETERIZATION   PROCEDURES PERFORMED:  1. Left heart catheterization  2. Selective coronary angiography.  3. Left ventricular angiogram.   OPERATOR:  Verne Carrow, MD   INDICATIONS:  Atypical chest pain.   PROCEDURE IN DETAIL:  The patient was brought to the Heart  Catheterization Laboratory after signing informed consent for a left  heart catheterization.  The right groin was prepped and draped in  sterile fashion.  A 5-French arterial sheath was inserted into the right  femoral artery.  Standard diagnostic catheters were used to engage the  left coronary system and the right coronary artery for coronary  angiograms.  A 5-French pigtail catheter was used to cross the aortic  valve into the left ventricle.  I decided to perform a left  ventriculogram even in the face of renal insufficiency, since we had  used such a small volume of contrast for the coronary angiograms.  Left  ventricular angiogram was performed with 24 mL of contrast.  The pigtail  catheter was then pulled back across the aortic valve with no  significant pressure gradient measured.  The patient tolerated the  procedure well and was taken to the recovery area where her sheath will  be removed.  Manual pressure will be used for hemostasis.  The patient  will require 3 hours of bedrest following sheath removal.   FINDINGS:  Left main coronary artery has no evidence of disease and  bifurcates into the LAD and the circumflex.  There is no angiographic  evidence of disease in the left anterior descending coronary artery or  the circumflex coronary artery.  The circumflex is a  large dominant  vessel.  The right coronary artery was a small nondominant vessel and  had no angiographic evidence of disease.   Left ventricular angiogram demonstrated normal systolic function of the  left ventricle with an ejection fraction of 65%.   Hemodynamic data from the case showed an LV pressure of 153/16, end-  diastolic pressure 21, and central aortic pressure 144/95.   IMPRESSION:  1. Normal coronary arteries.  2. Normal left ventricular systolic function.  3. Elevated end-diastolic pressures of the left ventricle.  4. Chronic renal insufficiency with a total of 65 mL of contrast dye      used for this catheterization.   RECOMMENDATIONS:  I do not recommend any further cardiac workup of the  patient's atypical chest pain at this time.  I do think that she may  benefit from mild diuresis with her elevated filling pressures.  I will  give her 20 mg of IV Lasix here in the recovery area.      Verne Carrow, MD  Electronically Signed     CM/MEDQ  D:  09/09/2008  T:  09/10/2008  Job:  [id number]

## 2011-05-07 NOTE — Discharge Summary (Signed)
NAMETYONNA, TALERICO NO.:  [id number]   MEDICAL RECORD NO.:  [id number]          PATIENT TYPE:  INP   LOCATION:  3041                         FACILITY:  [facility]   PHYSICIAN:  Marcellus Scott, MD     DATE OF BIRTH:  23-Feb-1951   DATE OF ADMISSION:  10/05/2008  DATE OF DISCHARGE:  10/09/2008                               DISCHARGE SUMMARY   FAMILY HISTORY:  Maurice March, M.D.   DISCHARGE DIAGNOSES:  1. Bilateral lower lobe pneumonia.  2. Question influenza.  3. Urinary tract infection.  4. Acute renal failure - resolved.  5. Uncontrolled type 2 diabetes mellitus.  6. Hypertension.  7. Normocytic anemia.  8. Chronic pain.  9. Chronic obstructive pulmonary disease.  10.Obesity.  11.Dyslipidemia.  12.History of sarcoidosis.  13.Question uterine fibroids.   DISCHARGE MEDICATIONS:  1. Albuterol inhaler 2 puffs q. 6 hourly p.r.n.  2. Atenolol reduced to 25 mg p.o. daily.  3. Cymbalta 60 mg p.o. q.h.s.  4. Flovent 110 mcg, 1 puff inhaled b.i.d.  5. Glucotrol 10 mg p.o. daily.  6. Metformin 500 mg p.o. b.i.d.  7. Neurontin 600 mg p.o. b.i.d.  8. Nexium 40 mg p.o. daily.  9. Claritin 10 mg p.o. daily.  10.Maxzide 25/37.5, 1 p.o. daily.  11.Zocor 40 mg p.o. daily.  12.Oxycodone IR 15 mg p.o. t.i.d. p.r.n.  13.OxyContin SR 20 mg p.o. b.i.d.  14.Tamiflu 75 mg p.o. b.i.d. to complete 5 day course.  15.Levofloxacin 500 mg p.o. daily.  16.Mucinex 600 mg p.o. b.i.d.   MEDICATIONS CURRENTLY HELD:  Avapro.   MEDICATIONS NOT TAKEN:  1. Percocet.  2. Prednisone.   PROCEDURES:  1. CT of the abdomen without contrast on October 06, 2008.  Impression:  A.  Negative for nephrolithiasis or hydronephrosis.  B.  Patchy bilateral lower lobe air space opacity.  1. CT of the pelvis without contrast.  Impression:  A. Negative for      distal ureteral calculus.  B. Normal appendix.  C. Globular      morphology of the uterine fundus suggesting leiomyomata.  Ultrasound could be useful for further evaluation.  Outpatient      ultrasound does seem necessary.  2. Renal ultrasound.  Impression:  No hydronephrosis.  No solid or      cystic renal lesion.  3. CT of the head without contrast.  Impression:  A. Mild chronic      sphenosinusitis.  B. No acute intracranial findings were      identified.  4. Chest x-ray of October 06, 2008.  Impression:  Cardiomegaly with      low lung volumes and probable mild venous congestion.   PERTINENT LABS:  CBC with hemoglobin of 9.8, hematocrit 29.5, white  blood cell count 7.9, platelets 270.  Basic metabolic panel yesterday  with glucose of 229, BUN 7, creatinine 0.66.  Sputum culture was not an  adequate sample.  BNP was 59.  Hemoglobin A1c 9.4.  Cardiac panel was  cycled and negative.  TSH was 0.539.  Lipid panel was unremarkable.   CONSULTATIONS:  None.   HOSPITAL COURSE/PATIENT DISPOSITION:  Please  refer to history and  physical note for initial admission details.  In summary, [name]  is a 60 year old African American female patient with history of  sarcoidosis, hypertension, type 2 diabetes and hyperlipidemia who was  discharged from this hospital on September 10, 2008 when she was worked  up for chest pain and had cardiac catheterization with normal coronary  arteries.  She also had sustained angioedema from lisinopril and was  sent home on a short course of steroids.  She now presented to the  emergency room with weakness for 2 days.  She also had flu-like  symptoms.  Her evaluation revealed a creatinine of 2.9 which was acute.  She also complained of abdominal distention and discomfort.  She did  initially have some infraumbilical pain.  For this, she was admitted to  the hospital for further evaluation and management.  1. Bilateral lower lobe pneumonia:  The patient had a CT of the      abdomen and pelvis to evaluate for abdominal distention and pain      which revealed bilateral lower lobe  pneumonia.  The patient was      initially placed on Cipro which was changed to Levaquin.  The      patient has done quite well and has been asymptomatic.  No cough or      dyspnea.  Her lung findings are clear.  She also had expressed flu-      like symptoms following which she was placed on droplet isolation      and was placed on empiric Tamiflu.  She is to complete the course      of both of these medications.  The patient has been advised to      continue to use a loop face mask at home around her grandchildren      and other family members for the next 2 days.  She has also been      advised to have the children see their pediatrician if they develop      any fever or respiratory symptoms.  The patient verbalizes      understanding.  2. Question urinary tract infection:  The same antibiotics treating      her pneumonia would have treated this.  3. Acute renal failure, which was probably a combination of      dehydration, diuretics and ARB.  Obviously, her diuretics and ARB      were held.  She was intravenously hydrated and her renal failure      has resolved.  There is no evidence of obstruction on ultrasound or      CT.  The patient is being resumed on Maxzide secondary to pedal      edema.  Her Avapro is, however, held.  Her blood pressures were      soft until yesterday and are increasing in the 140s systolic at      this time.  It is recommended that her basic metabolic panel be      followed in the next week or two.  Also, we leave the decision to      resume ARB probably at a lower dose to her primary medical doctor.  4. Anemia, which is probably chronic and is normocytic and there is no      overt bleeding.  Consider outpatient gastrointestinal workup as      deemed necessary.  5. Uncontrolled diabetes:  Obviously, the patient's Glucotrol and  Metformin were held because of renal insufficiency.  They were      resumed.  Her sugars are still in the 200 to 250s range.   Her      Metformin was only started last night.  Her outpatient diabetes      control has been poor.  This needs to be addressed and need for      tighter control which might include titration of her medications      and/or adding insulin.  6. Chronic pain:  The patient is on narcotics.  No worsening of her      symptoms.  Consider tapering her narcotics as an outpatient.  We      have not provided with any prescriptions for these during this      admission.   The patient has been advised to follow up with her primary medical  doctor in the next 1 to 2 weeks with repeat blood tests including CBC  and BMET.      Marcellus Scott, MD  Electronically Signed     AH/MEDQ  D:  10/09/2008  T:  10/09/2008  Job:  [id number]   cc:   Maurice March, M.D.

## 2011-05-07 NOTE — Consult Note (Signed)
[name], [name] NO.:  [id number]   MEDICAL RECORD NO.:  [id number]          PATIENT TYPE:  INP   LOCATION:  3707                         FACILITY:  [facility]   PHYSICIAN:  Doylene Canning. Ladona Ridgel, MD    DATE OF BIRTH:  10/03/1951   DATE OF CONSULTATION:  DATE OF DISCHARGE:                                 CONSULTATION   The consultation is requested by the [facility] primary hospital service.   INDICATION FOR CONSULTATION:  Evaluation of chest pain in a patient with  multiple cardiac risk factors.   HISTORY OF PRESENT ILLNESS:  The patient is a 60 year old woman who is  admitted to the hospital after experiencing chest pain associated with  shortness of breath.  She also was found to have angioedema and swelling  of her face, hands, arms after restarting ACE inhibitors.  The patient  noted that over the last week, she had had intermittent episodes of  chest discomfort, sometimes these are related to exertion, sometimes,  however, they are not related to exertion.  Of course, the patient has  other causes of the chest discomfort with a known history of  gastroesophageal reflux disease and hiatal hernia.  She is admitted for  additional evaluation and treatment of her angioedema.  Her initial  cardiac enzymes were negative.   Her past medical history is notable for sarcoidosis.  She has a history  of hiatal hernia.  She has a history of reflux disease.  She has history  of chronic back pain.  She has history of UVIs presumably due to  sarcoid.  She has a history of anemia, etiology is unclear.   SOCIAL HISTORY:  The patient lives in [place].  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  She has a history of tobacco use,  stopping 16 years ago.  She has a history of polysubstance abuse,  stopping that 18 years ago by her report.  Prior to that, she used  cocaine and alcohol frequently.   Family history is notable for diabetes, uterine cancer, and  coronary  disease.  She has a sister with an MI.   Her medications include aspirin 325 a day, atenolol 50 a day, Cymbalta,  Pepcid 20 mg IV q.12, Flovent, glipizide, gabapentin, Glucotrol, insulin  sliding scale, triamterene/hydrochlorothiazide, and oxycodone.   Her review of systems is as noted in the HPI.  She also is bothered by  constipation.  She has a history of peripheral edema.  She has problems  with vertigo at times, but she has never had frank syncope.   On physical exam, she is a pleasant, obese, middle-aged woman, in no  distress.  VITAL SIGNS:  Blood pressure is 130/90, the pulse was 72 and regular,  respirations 20, temperature is 98.6.  HEENT:  Normocephalic and atraumatic.  Pupils equal and round.  Oropharynx moist.  Sclerae anicteric.  NECK:  No jugular distention.  There is no thyromegaly.  Trachea is  midline.  The carotid 2+ and symmetric.  LUNGS:  Clear bilaterally to auscultation.  No wheezes, rales, or  rhonchi are present.  No increased work of breathing.  CARDIOVASCULAR:  Regular rate and rhythm.  Normal S1-S2.  There is soft  S4 gallop present.  The PMI was not enlarged but was laterally  displaced.  ABDOMEN:  Soft, nontender, nondistended.  No organomegaly.  Bowel sounds  are present.  No rebound or guarding.  EXTREMITIES:  Demonstrate no cyanosis, clubbing, or edema.  Pulses are  2+ and symmetric.  NEUROLOGIC:  Alert and oriented x3.  The cranial nerves are intact.  Strength is 5/5 and symmetric.   An EKG demonstrates sinus rhythm with normal axis and intervals.  Labs  demonstrate initial negative cardiac enzymes and troponin (her MB was  6.1, a D-dimer was negative, hemoglobin was 10).   IMPRESSION:  1. Chest pain both with typical as well as atypical features for      coronary artery disease.  2. Diabetes.  3. Hypertension.  4. Obesity.  5. Sarcoidosis.  6. Polysubstance abuse with discontinuation of all abusive substances.   DISCUSSION:  I  have discussed treatment options with the patient.  I  have recommended left heart catheterization to clarify her coronary  anatomy as the cause of her chest pain is reasonable as she has  multitude of cardiac risk factors in both typical as well as atypical  features.  This will be scheduled at earliest possible convenient time.      Doylene Canning. Ladona Ridgel, MD  Electronically Signed     GWT/MEDQ  D:  09/08/2008  T:  09/09/2008  Job:  [id number]

## 2011-05-10 NOTE — Consult Note (Signed)
NAMEJENNA, [name] NO.:  [id number]   MEDICAL RECORD NO.:  [id number]          PATIENT TYPE:  INP   LOCATION:  3713                         FACILITY:  [facility]   PHYSICIAN:  Dunnavant Bing, M.D.  DATE OF BIRTH:  1951/05/14   DATE OF CONSULTATION:  05/25/2005  DATE OF DISCHARGE:                                   CONSULTATION   REFERRING PHYSICIAN:  C. Ulyess Mort, M.D.   PRIMARY CARE PHYSICIAN:  Maurice March, M.D. at [facility].   REASON FOR CONSULTATION:  Dr. Aundria Rud request for consultation concerning  this 60 year old woman with chest pain is greatly appreciated.   HISTORY OF PRESENT ILLNESS:  Ms. [name] has no prior cardiovascular  history. She has a vague recollection of a study that sounds as it may have  been a pharmacologic stress radionuclide examination, but this is uncertain.  She has had no significant prior episodes of chest discomfort nor has she  been hospitalized for any reason other than childbirth. She does have  multiple cardiovascular risk factors including hypertension that has  generally been well controlled with medication. She may have borderline  diabetes. Her lipid status is uncertain. She has a 50 pack-year history of  cigarette smoking, but this was discontinued more than a decade ago.   She describes the gradual onset of right chest discomfort that is fairly  localized. The quality is sharp. The intensity is mild to moderate. There is  some radiation to the right shoulder but no associated dyspnea nor  diaphoresis. The onset was early in the morning of admission. The discomfort  waxed and waned for approximately six hours prompting presentation to the  emergency department. Ms. [name] describes recent symptoms of a URI with  cough, sputum and wheezing. This is improved after the course of steroids  and antibiotics. There is no localized tenderness. The discomfort is worse  when she was up she is up and about, but not  clearly related to movement of  the trunk or the right upper extremity or aerobic exercise.   PAST MEDICAL HISTORY:  1.  Notable for sarcoidosis.  2.  Hiatal hernia with GERD.  3.  Chronic low back pain.  4.  Depression.  5.  Allergic rhinitis.  6.  Fibromyalgia.  7.  She also has a uterine fibroids.  8.  Glaucoma.  9.  A history of uveitis.   PAST SURGICAL HISTORY:  Two benign left breast excisional biopsies.   HABITS:  There is a history of substance abuse many years ago including  alcohol, cocaine and prescription medications.   ALLERGIES:  The patient reports no known medical allergies.   CURRENT MEDICATIONS:  Atenolol, HCTZ, enalapril, Nexium, Cymbalta,  albuterol, Flovent by MDI, Nasacort.   SOCIAL HISTORY:  Works as a Lawyer, typically night shifts.   FAMILY HISTORY:  Close family members with coronary or other cardiac  disorders.   REVIEW OF SYSTEMS:  Notable for intermittent fatigue, recent pharyngitis,  recent cough, nocturnal snoring, intermittent diarrhea, and chronic back  pain. All other systems reviewed and are negative.   PHYSICAL EXAMINATION:  GENERAL:  On  exam, a very pleasant, somewhat  overweight woman in no acute distress.  VITAL SIGNS:  Temperature is 98, respirations 18, O2 saturation 97% on room  air, blood pressure 125/75, heart rate 85 and regular, respirations 18.  HEENT: Anicteric sclerae; normal lids and conjunctiva.  NECK: No jugular venous distension; no carotid bruits.  LUNGS: Clear.  CARDIAC: Normal first and second heart sounds; fourth heart sound present.  ABDOMEN: Soft and nontender; no organomegaly.  EXTREMITIES: No edema; normal distal pulses.  NEUROMUSCULAR: Cranial normal cranial nerves; normal strength and tone.   LABORATORY DATA:  EKG shows normal sinus rhythm; shallow anterior and  inferior T-wave inversions with slight ST-segment depression.   Other laboratory notable for potassium of 2.4, hemoglobin of 10.7 with a  hematocrit  31.8. Normal renal function, glucose values up to 306, and  elevated CPKs with normal MB and normal troponin. The cholesterol is 198  with LDL of 90 and HDL of 98.   IMPRESSION:  Ms. [name] presents with atypical right chest pain in the  setting of recent upper respiratory tract infection. A chest wall etiology  is most likely. She has elevated CPKs and EKG changes, which probably  relates to her profound hypokalemia. There may be a component left  ventricular hypertrophy as well. She is certainly not a high risk for  coronary disease or an acute coronary syndrome. I agree with plans for a  stress nuclear study and will be happy to follow this nice woman with you.       RR/MEDQ  D:  05/25/2005  T:  05/25/2005  Job:  [id number]

## 2011-05-10 NOTE — Discharge Summary (Signed)
NAMEGIUSEPPINA, [name]            ACCOUNT NO.:  [id number]   MEDICAL RECORD NO.:  [id number]          PATIENT TYPE:  INP   LOCATION:  3713                         FACILITY:  [facility]   PHYSICIAN:  C. Ulyess Mort, M.D.DATE OF BIRTH:  [birth year]   DATE OF ADMISSION:  05/24/2005  DATE OF DISCHARGE:                                 DISCHARGE SUMMARY   DISCHARGE DIAGNOSES:  1.  Atypical angina.  2.  Anemia.  3.  Hypertension.  4.  Sarcoidosis.  5.  Fibromyalgia.  6.  Diabetes mellitus type 2.  7.  Hypokalemia.  8.  Chronic back pain.  9.  History of glaucoma.  10. History of uveitis, history of iritis.  11. Uterine fibroids.  12. Allergic rhinitis.  13. Depression.   DISCHARGE MEDICATIONS:  1.  Aspirin 325 mg p.o. daily.  2.  Atenolol 50 mg p.o. daily.  3.  Enalapril 5 mg p.o. b.i.d.  4.  Nitroglycerin 0.4 mg sublingual every five minutes x3 p.r.n.  5.  Cymbalta 40 mg p.o. daily.  6.  Flovent 110 mcg one puff inhaled b.i.d.  7.  Nexium 40 mg p.o. q.h.s.  8.  K-Dur 20 mEq p.o. daily for two more days.  9.  Albuterol MDI one to two puffs q.6h. p.r.n.  10. Metformin 500 mg p.o. daily.   PROCEDURES THIS ADMISSION:  Treadmill Cardiolite done on May 27, 2005,  showed no ischemia and ejection fraction of 59%.  No further cardiac  evaluation was considered needed after this procedure.   CONSULTANTS THIS ADMISSION:  [facility], Louviers Bing, M.D.   HISTORY OF PRESENT ILLNESS:  Ms. [name] is a 60 year old African-American  lady with past medical history significant for hypertension, obesity,  diabetes, sarcoidosis, and fibromyalgia.  She presented to the ER with chest  pain that started at midnight of the day of admission.  Initially was 10/10  for two minutes localized on the mid-right side of the chest, radiating to  the right shoulder, associated with sweating but no nausea and vomiting.  The pain decreased spontaneously to 5/10 at rest but intensified at 10 when  the patient ambulated.  She denied any palpitations or syncope.  She  presented to the ER for further evaluation and treatment.  The patient also  admitted to shortness of breath, cough productive of green sputum, and green  nasal discharge with posterior dripping a week prior to admission, for which  she was treated with nebulizers in the office by Dr. Audria Nine at  [facility] and with antibiotics and p.o. steroids in a tapering manner,  plus MDI Flovent and albuterol at home.  Two days prior to admission, the  patient developed diarrhea, approximately five stools a day, nonbloody,  normal stool color, that spontaneously resolved.   PHYSICAL EXAMINATION:  VITAL SIGNS:  At admission, the patient had a heart  rate of 63, a blood pressure of 141/88, a temperature of 96.5, respiratory  rate 18, and O2 saturation of 97% on room air.  GENERAL:  She was in no acute distress.  HEENT:  Eyes:  PERRLA, extraocular movements intact.  The oropharynx mucosa:  Enlarged tonsils appreciated bilaterally.  No exudate, no erythema of  oropharynx mucosa.  NECK:  No jugular venous distention.  No thyromegaly.  No lymphadenopathy.  RESPIRATORY:  Clear to auscultation bilaterally.  CARDIOVASCULAR:  Regular rate and rhythm, no murmurs, no rubs.  ABDOMEN:  Bowel sounds positive, soft, obese, tender all over.  EXTREMITIES:  No edema, 2+ pulses.  NEUROLOGIC:  Nonfocal, and oriented x3.   ADMISSION LABORATORY DATA:  Sodium of 138, potassium 3.4, chloride 99,  bicarb 31, BUN 15, creatinine 0.7 with a glucose of 244.  White blood cells  6.8, AND 3.1, hemoglobin 10.9, MCV 83.7 and platelets 231.  Anion gap of 8.  Bilirubin of 0.4, alkaline phosphatase 79, SGOT 24, SGPT 23, protein 6.9,  albumin 3.6, and calcium 9.1.  D-dimer was 0.23.  EKG showed normal sinus  rhythm with rate 70 beats per minute, ST depression D2, D3, aVF, and  flattening of T-waves.  Cardiac markers point of care negative x3.  Chest x-  ray  showed cardiomegaly and interstitial edema, and ultrasound of the  abdomen showed hepatomegaly and normal gallbladder.   HOSPITAL COURSE:  Problem 1.  CHEST PAIN:  PE was ruled out by a negative D-dimer.  The chest  pain is atypical for angina.  We checked cardiac enzymes, which were  negative x3.  Serial EKGs showed nonspecific ST changes.  The patient has  multiple risk factors for coronary artery disease, hypertension, former  smoking, diabetes, obesity, plus remote family history.  We were highly  concerned of an acute coronary event, which was ruled out by negative  cardiac measures.  Still, the patient was admitted for further evaluation.  She was started on aspirin.  She was started on IV nitroglycerin, which  improved somewhat her chest pain.  She had a Cardiolite treadmill on May 27, 2005, which showed no acute changes and an ejection fraction of 59%.  She is  going to follow up with her family care doctor at [facility], and she will  have adjustments in her treatment per primary care.   Problem 2.  HYPOKALEMIA:  During hospitalization potassium was repleted.  She will also be discharged on two more days of K-Dur 20 mEq.  She needs as  repeat BMP at follow-up.   Problem 3.  ANEMIA:  FOBT was negative throughout hospitalization.  Iron was  decreased at 32.  Total iron-binding capacity was decreased to 329.  B12 was  normal.  Anemia is of unsure etiology.  Further evaluation can be done as  outpatient.   Problem 4.  DIABETES MELLITUS TYPE 2:  At this admission, hemoglobin A1C was  found to be 8.1.  The patient was started on sliding scale insulin.  She is  going to be discharged at home on metformin 500 mg p.o. daily.  The patient  will follow up with family care doctor for further assessment and plan.   Problem 5.  HISTORY OF SARCOIDOSIS:  Chest x-ray did not show any  abnormalities.  Follow-up as outpatient.  Problem 6.  DIARRHEA:  We checked a stool for C. difficile,  which was  negative.   Problem 7.  ABDOMINAL PAIN:  The patient has history of GERD and hiatal  hernia.  Ultrasound of the abdomen showed hepatomegaly and normal  gallbladder.  The patient will follow up with primary care doctor for  further assessment and treatment.   Problem 8.  PROPHYLAXIS:  During hospitalization the patient was placed on  Lovenox for  DVT prophylaxis.   LABORATORY DATA AT DISCHARGE:  Sodium 136, potassium 3.2, chloride 104,  bicarb 27, BUN 7, creatinine 0.6, glucose 176.  Hemoglobin of 11, white  blood cells 6.2, platelets 230, MCV 84.5.  Calcium 9.  TSH 0.670.  BNP less  than 30.  Ferritin 113.  ACE 39, which is within normal limits (9-67).  RBC  folate still pending.  Hemoglobin A1C 8.1.   The patient will follow up with Dr. Audria Nine at [facility] on the date of  Thursday at 10:30 a.m., on May 30, 2005.  She will need repeat BMP for  follow-up on hypokalemia and also because of her recently-diagnosed diabetes  mellitus, she will probably need follow-up and diabetes classes.      DA/MEDQ  D:  05/27/2005  T:  05/27/2005  Job:  [id number]   cc:   Maurice March, M.D.  [address] [place]  [place] [address]  Fax: [phone]

## 2011-05-13 ENCOUNTER — Other Ambulatory Visit: Payer: Self-pay | Admitting: Obstetrics & Gynecology

## 2011-05-13 ENCOUNTER — Other Ambulatory Visit (HOSPITAL_COMMUNITY)
Admission: RE | Admit: 2011-05-13 | Discharge: 2011-05-13 | Disposition: A | Discharge status: Home / Self Care | Payer: Medicaid Other | Source: Ambulatory Visit | Attending: Obstetrics and Gynecology | Admitting: Obstetrics and Gynecology

## 2011-05-13 ENCOUNTER — Encounter (INDEPENDENT_AMBULATORY_CARE_PROVIDER_SITE_OTHER): Payer: Medicaid Other | Admitting: Obstetrics & Gynecology

## 2011-05-13 DIAGNOSIS — N949 Unspecified condition associated with female genital organs and menstrual cycle: Secondary | ICD-10-CM

## 2011-05-13 DIAGNOSIS — N859 Noninflammatory disorder of uterus, unspecified: Secondary | ICD-10-CM | POA: Insufficient documentation

## 2011-05-13 DIAGNOSIS — E669 Obesity, unspecified: Secondary | ICD-10-CM

## 2011-05-14 NOTE — Group Therapy Note (Signed)
NAME:  [name], [name]                ACCOUNT NO.:  [id number]  MEDICAL RECORD NO.:  [id number]           PATIENT TYPE:  A  LOCATION:  [facility]                   FACILITY:  [facility]  PHYSICIAN:  Allie Bossier, MD        DATE OF BIRTH:  04-05-1951  DATE OF SERVICE:  05/13/2011                                 CLINIC NOTE  HISTORY OF PRESENT ILLNESS:  [name] is a 60 year old married African American G5, P5 who has been seen here since a year ago.  She had been complaining of chronic pelvic pain with radiation to her back and her buttocks.  Please note, she has been menopausal since 2001, so this pain is not associated with periods.  She was noted to have a fibroid uterus on ultrasound done in March 2011.  Her ovaries appeared normal.  The fibroid measured 6.5 cm and is noted to be an anterior uterine fibroid versus submucosal component.  She denies abnormal bleeding.  She does report some dyspareunia.  She has seen Dr. Debroah Loop and Dr. Jolayne Panther for the same issue.  Dr. Debroah Loop saw her most recently in July 2011.  At that time, he felt that her pain was not necessarily from her fibroid uterus. Please note that she has an extended medical history including fibromyalgia, sarcoidosis, cardiomyopathy, depression, adult onset diabetes, glaucoma, hypertension, reflux, and obesity.  On further review of exam is that she has not had a Pap smear since last year, so I did a Pap smear, I did an endometrial biopsy without difficulty, and the bimanual exam reveals a uterus that is at the upper limits of normal for size, it is relatively mobile, and she does not appear to be exquisitely tender to any portion of the exam.  ASSESSMENT AND PLAN:  Pelvic pain.  Just like Dr. Debroah Loop, I am not convinced that her pelvic pain is directly related to her fibroid.  I have, however, offered her hysterectomy, robotic-assisted laparoscopic hysterectomy, and removal of her ovaries and tubes.  I counseled her that I could  not guarantee her that her pain would be resolved but that would be one left possible etiology of her pain.  However, I have counseled her that surgery involves risks, especially in somebody with extensive medical history.  At this time, she is going to take home some printed literature about the procedure and will come back when she has made a decision.     Allie Bossier, MD    MCD/MEDQ  D:  05/13/2011  T:  05/14/2011  Job:  [id number]

## 2011-06-18 ENCOUNTER — Emergency Department (HOSPITAL_COMMUNITY)
Admission: EM | Admit: 2011-06-18 | Discharge: 2011-06-18 | Disposition: A | Discharge status: Home / Self Care | Payer: Medicaid Other | Attending: Emergency Medicine | Admitting: Emergency Medicine

## 2011-06-18 DIAGNOSIS — M5380 Other specified dorsopathies, site unspecified: Secondary | ICD-10-CM | POA: Insufficient documentation

## 2011-06-18 DIAGNOSIS — R252 Cramp and spasm: Secondary | ICD-10-CM | POA: Insufficient documentation

## 2011-06-18 DIAGNOSIS — T7840XA Allergy, unspecified, initial encounter: Secondary | ICD-10-CM | POA: Insufficient documentation

## 2011-06-18 DIAGNOSIS — E119 Type 2 diabetes mellitus without complications: Secondary | ICD-10-CM | POA: Insufficient documentation

## 2011-06-18 DIAGNOSIS — D869 Sarcoidosis, unspecified: Secondary | ICD-10-CM | POA: Insufficient documentation

## 2011-06-18 DIAGNOSIS — X58XXXA Exposure to other specified factors, initial encounter: Secondary | ICD-10-CM | POA: Insufficient documentation

## 2011-06-18 DIAGNOSIS — I10 Essential (primary) hypertension: Secondary | ICD-10-CM | POA: Insufficient documentation

## 2011-06-18 LAB — CBC
MCH: 27.2 pg (ref 26.0–34.0)
MCHC: 32.3 g/dL (ref 30.0–36.0)
MCV: 84.1 fL (ref 78.0–100.0)
Platelets: 206 10*3/uL (ref 150–400)
RDW: 14.9 % (ref 11.5–15.5)
WBC: 6.2 10*3/uL (ref 4.0–10.5)

## 2011-06-18 LAB — DIFFERENTIAL
Eosinophils Absolute: 0.2 10*3/uL (ref 0.0–0.7)
Eosinophils Relative: 3 % (ref 0–5)
Lymphs Abs: 3.3 10*3/uL (ref 0.7–4.0)
Monocytes Absolute: 0.4 10*3/uL (ref 0.1–1.0)
Monocytes Relative: 6 % (ref 3–12)

## 2011-06-18 LAB — RAPID URINE DRUG SCREEN, HOSP PERFORMED
Amphetamines: NOT DETECTED
Benzodiazepines: NOT DETECTED
Opiates: NOT DETECTED

## 2011-06-18 LAB — URINALYSIS, ROUTINE W REFLEX MICROSCOPIC
Bilirubin Urine: NEGATIVE
Nitrite: NEGATIVE
Protein, ur: NEGATIVE mg/dL
Urobilinogen, UA: 1 mg/dL (ref 0.0–1.0)

## 2011-06-18 LAB — COMPREHENSIVE METABOLIC PANEL
Albumin: 4 g/dL (ref 3.5–5.2)
BUN: 18 mg/dL (ref 6–23)
Creatinine, Ser: 0.73 mg/dL (ref 0.50–1.10)
GFR calc Af Amer: >60 mL/min (ref >60)
Glucose, Bld: 114 mg/dL — ABNORMAL HIGH (ref 70–99)
Total Protein: 7.4 g/dL (ref 6.0–8.3)

## 2011-06-18 LAB — CK: Total CK: 203 U/L — ABNORMAL HIGH (ref 7–177)

## 2011-07-26 ENCOUNTER — Emergency Department (HOSPITAL_COMMUNITY)
Admission: EM | Admit: 2011-07-26 | Discharge: 2011-07-27 | Disposition: A | Discharge status: Home / Self Care | Payer: Medicaid Other | Attending: Emergency Medicine | Admitting: Emergency Medicine

## 2011-07-26 ENCOUNTER — Emergency Department (HOSPITAL_COMMUNITY): Payer: Medicaid Other

## 2011-07-26 DIAGNOSIS — D869 Sarcoidosis, unspecified: Secondary | ICD-10-CM | POA: Insufficient documentation

## 2011-07-26 DIAGNOSIS — H53149 Visual discomfort, unspecified: Secondary | ICD-10-CM | POA: Insufficient documentation

## 2011-07-26 DIAGNOSIS — E119 Type 2 diabetes mellitus without complications: Secondary | ICD-10-CM | POA: Insufficient documentation

## 2011-07-26 DIAGNOSIS — F3289 Other specified depressive episodes: Secondary | ICD-10-CM | POA: Insufficient documentation

## 2011-07-26 DIAGNOSIS — R51 Headache: Secondary | ICD-10-CM | POA: Insufficient documentation

## 2011-07-26 DIAGNOSIS — R197 Diarrhea, unspecified: Secondary | ICD-10-CM | POA: Insufficient documentation

## 2011-07-26 DIAGNOSIS — G8929 Other chronic pain: Secondary | ICD-10-CM | POA: Insufficient documentation

## 2011-07-26 DIAGNOSIS — M549 Dorsalgia, unspecified: Secondary | ICD-10-CM | POA: Insufficient documentation

## 2011-07-26 DIAGNOSIS — K219 Gastro-esophageal reflux disease without esophagitis: Secondary | ICD-10-CM | POA: Insufficient documentation

## 2011-07-26 DIAGNOSIS — R109 Unspecified abdominal pain: Secondary | ICD-10-CM | POA: Insufficient documentation

## 2011-07-26 DIAGNOSIS — Z79899 Other long term (current) drug therapy: Secondary | ICD-10-CM | POA: Insufficient documentation

## 2011-07-26 DIAGNOSIS — I10 Essential (primary) hypertension: Secondary | ICD-10-CM | POA: Insufficient documentation

## 2011-07-26 DIAGNOSIS — H409 Unspecified glaucoma: Secondary | ICD-10-CM | POA: Insufficient documentation

## 2011-07-26 DIAGNOSIS — H5710 Ocular pain, unspecified eye: Secondary | ICD-10-CM | POA: Insufficient documentation

## 2011-07-26 DIAGNOSIS — M129 Arthropathy, unspecified: Secondary | ICD-10-CM | POA: Insufficient documentation

## 2011-07-26 DIAGNOSIS — F329 Major depressive disorder, single episode, unspecified: Secondary | ICD-10-CM | POA: Insufficient documentation

## 2011-07-26 DIAGNOSIS — R0602 Shortness of breath: Secondary | ICD-10-CM | POA: Insufficient documentation

## 2011-07-26 LAB — COMPREHENSIVE METABOLIC PANEL
ALT: 20 U/L (ref 0–35)
CO2: 29 mEq/L (ref 19–32)
Calcium: 10.3 mg/dL (ref 8.4–10.5)
Creatinine, Ser: 0.71 mg/dL (ref 0.50–1.10)
GFR calc Af Amer: >60 mL/min (ref >60)
GFR calc non Af Amer: >60 mL/min (ref >60)
Glucose, Bld: 89 mg/dL (ref 70–99)
Sodium: 140 mEq/L (ref 135–145)
Total Protein: 8.3 g/dL (ref 6.0–8.3)

## 2011-07-26 LAB — CBC
Hemoglobin: 12.3 g/dL (ref 12.0–15.0)
MCH: 27.7 pg (ref 26.0–34.0)
MCHC: 33.8 g/dL (ref 30.0–36.0)
MCV: 82 fL (ref 78.0–100.0)
RBC: 4.44 MIL/uL (ref 3.87–5.11)

## 2011-07-26 LAB — DIFFERENTIAL
Basophils Relative: 1 % (ref 0–1)
Eosinophils Absolute: 0.1 10*3/uL (ref 0.0–0.7)
Lymphs Abs: 4.4 10*3/uL — ABNORMAL HIGH (ref 0.7–4.0)
Monocytes Absolute: 0.4 10*3/uL (ref 0.1–1.0)
Monocytes Relative: 5 % (ref 3–12)
Neutro Abs: 2.4 10*3/uL (ref 1.7–7.7)

## 2011-07-27 LAB — URINALYSIS, ROUTINE W REFLEX MICROSCOPIC
Leukocytes, UA: NEGATIVE
Nitrite: NEGATIVE
Specific Gravity, Urine: 1.027 (ref 1.005–1.030)
Urobilinogen, UA: 0.2 mg/dL (ref 0.0–1.0)
pH: 5 (ref 5.0–8.0)

## 2011-09-12 LAB — URINALYSIS, ROUTINE W REFLEX MICROSCOPIC
Bilirubin Urine: NEGATIVE
Glucose, UA: NEGATIVE
Hgb urine dipstick: NEGATIVE
Ketones, ur: NEGATIVE
Nitrite: NEGATIVE
Protein, ur: NEGATIVE
Specific Gravity, Urine: 1.019
Urobilinogen, UA: 1
pH: 7

## 2011-09-12 LAB — URINE MICROSCOPIC-ADD ON

## 2011-09-18 LAB — B-NATRIURETIC PEPTIDE (CONVERTED LAB): Pro B Natriuretic peptide (BNP): 68

## 2011-09-18 LAB — POCT I-STAT, CHEM 8
Calcium, Ion: 1.22
Glucose, Bld: 209 — ABNORMAL HIGH
HCT: 33 — ABNORMAL LOW
Hemoglobin: 11.2 — ABNORMAL LOW
TCO2: 30

## 2011-09-20 LAB — URINALYSIS, ROUTINE W REFLEX MICROSCOPIC
Bilirubin Urine: NEGATIVE
Bilirubin Urine: NEGATIVE
Hgb urine dipstick: NEGATIVE
Hgb urine dipstick: NEGATIVE
Nitrite: NEGATIVE
Specific Gravity, Urine: 1.02
Specific Gravity, Urine: 1.022
Urobilinogen, UA: 0.2
pH: 6.5

## 2011-09-20 LAB — WET PREP, GENITAL
Trich, Wet Prep: NONE SEEN
Trich, Wet Prep: NONE SEEN
WBC, Wet Prep HPF POC: NONE SEEN
Yeast Wet Prep HPF POC: NONE SEEN

## 2011-09-20 LAB — GC/CHLAMYDIA PROBE AMP, GENITAL
Chlamydia, DNA Probe: NEGATIVE
Chlamydia, DNA Probe: NEGATIVE

## 2011-09-20 LAB — RPR: RPR Ser Ql: NONREACTIVE

## 2011-09-23 LAB — COMPREHENSIVE METABOLIC PANEL
ALT: 23
AST: 22
Albumin: 3.8
Alkaline Phosphatase: 87
Alkaline Phosphatase: 91
BUN: 20
BUN: 21
CO2: 27
Calcium: 9.1
Chloride: 96
Creatinine, Ser: 1.82 — ABNORMAL HIGH
Creatinine, Ser: 2.91 — ABNORMAL HIGH
GFR calc Af Amer: 20 — ABNORMAL LOW
GFR calc non Af Amer: 17 — ABNORMAL LOW
Glucose, Bld: 144 — ABNORMAL HIGH
Glucose, Bld: 200 — ABNORMAL HIGH
Potassium: 3.9
Potassium: 4
Sodium: 133 — ABNORMAL LOW
Total Bilirubin: 0.6
Total Protein: 7.4
Total Protein: 7.5

## 2011-09-23 LAB — APTT: aPTT: 30

## 2011-09-23 LAB — CBC
HCT: 30.4 — ABNORMAL LOW
HCT: 28.1 — ABNORMAL LOW
HCT: 29.5 — ABNORMAL LOW
HCT: 31.9 — ABNORMAL LOW
HCT: 32 — ABNORMAL LOW
Hemoglobin: 10.9 — ABNORMAL LOW
Hemoglobin: 10.6 — ABNORMAL LOW
Hemoglobin: 10.8 — ABNORMAL LOW
MCHC: 33.2
MCHC: 33.8
MCHC: 33.1
MCHC: 33.8
MCV: 86.9
MCV: 86.7
MCV: 87
MCV: 87.4
MCV: 87.4
Platelets: 255
Platelets: 328
RBC: 3.5 — ABNORMAL LOW
RBC: 3.72 — ABNORMAL LOW
RBC: 3.38 — ABNORMAL LOW
RBC: 3.67 — ABNORMAL LOW
RDW: 15.9 — ABNORMAL HIGH
RDW: 16.1 — ABNORMAL HIGH
RDW: 16.9 — ABNORMAL HIGH
WBC: 11.3 — ABNORMAL HIGH
WBC: 8.6
WBC: 7.9
WBC: 9.5

## 2011-09-23 LAB — POCT CARDIAC MARKERS
CKMB, poc: 4.4
Myoglobin, poc: 474
Troponin i, poc: <0.05

## 2011-09-23 LAB — URINALYSIS, ROUTINE W REFLEX MICROSCOPIC
Glucose, UA: NEGATIVE
Hgb urine dipstick: NEGATIVE
Ketones, ur: 15 — AB
Leukocytes, UA: NEGATIVE
Nitrite: NEGATIVE
Protein, ur: NEGATIVE
Protein, ur: 30 — AB
Specific Gravity, Urine: 1.034 — ABNORMAL HIGH
Urobilinogen, UA: 1
Urobilinogen, UA: 0.2
pH: 5

## 2011-09-23 LAB — DRUGS OF ABUSE SCREEN W/O ALC, ROUTINE URINE
Barbiturate Quant, Ur: NEGATIVE
Cocaine Metabolites: NEGATIVE
Creatinine,U: 69.5
Opiate Screen, Urine: NEGATIVE

## 2011-09-23 LAB — CARDIAC PANEL(CRET KIN+CKTOT+MB+TROPI)
CK, MB: 3.9
CK, MB: 4.2 — ABNORMAL HIGH
CK, MB: 2.3
Relative Index: 2.2
Total CK: 147
Total CK: 123
Troponin I: 0.03

## 2011-09-23 LAB — TROPONIN I
Troponin I: 0.01
Troponin I: 0.07 — ABNORMAL HIGH

## 2011-09-23 LAB — DIFFERENTIAL
Basophils Absolute: 0
Basophils Relative: 0
Eosinophils Absolute: 0.1
Eosinophils Relative: 1
Lymphocytes Relative: 32
Lymphs Abs: 3.1
Monocytes Absolute: 0.9
Monocytes Relative: 9
Neutro Abs: 5.4
Neutrophils Relative %: 57

## 2011-09-23 LAB — URINE MICROSCOPIC-ADD ON

## 2011-09-23 LAB — BASIC METABOLIC PANEL
BUN: 7
BUN: 9
CO2: 30
CO2: 31
CO2: 29
Calcium: 9.4
Chloride: 101
Chloride: 103
Chloride: 102
Chloride: 99
Creatinine, Ser: 0.57
GFR calc Af Amer: >60
GFR calc Af Amer: >60
Glucose, Bld: 229 — ABNORMAL HIGH
Potassium: 3.9
Potassium: 4.2
Sodium: 140

## 2011-09-23 LAB — GLUCOSE, CAPILLARY
Glucose-Capillary: 164 — ABNORMAL HIGH
Glucose-Capillary: 194 — ABNORMAL HIGH
Glucose-Capillary: 207 — ABNORMAL HIGH
Glucose-Capillary: 266 — ABNORMAL HIGH
Glucose-Capillary: 288 — ABNORMAL HIGH
Glucose-Capillary: 156 — ABNORMAL HIGH
Glucose-Capillary: 174 — ABNORMAL HIGH
Glucose-Capillary: 195 — ABNORMAL HIGH
Glucose-Capillary: 287 — ABNORMAL HIGH

## 2011-09-23 LAB — LIPID PANEL
Cholesterol: 172
HDL: 101
HDL: 82
LDL Cholesterol: 74
Triglycerides: 53
VLDL: 11

## 2011-09-23 LAB — MAGNESIUM
Magnesium: 1.9
Magnesium: 1.8

## 2011-09-23 LAB — PROTIME-INR
INR: 0.9
Prothrombin Time: 12.7

## 2011-09-23 LAB — D-DIMER, QUANTITATIVE: D-Dimer, Quant: 0.26

## 2011-09-23 LAB — HEMOGLOBIN A1C
Hgb A1c MFr Bld: 9.4 — ABNORMAL HIGH
Mean Plasma Glucose: 223
Mean Plasma Glucose: 223

## 2011-09-23 LAB — PHOSPHORUS: Phosphorus: 3

## 2011-09-23 LAB — TSH: TSH: 0.539

## 2011-09-23 LAB — EXPECTORATED SPUTUM ASSESSMENT W GRAM STAIN, RFLX TO RESP C

## 2011-09-27 LAB — CBC
HCT: 34.8 % — ABNORMAL LOW (ref 36.0–46.0)
Hemoglobin: 11.1 g/dL — ABNORMAL LOW (ref 12.0–15.0)
MCHC: 31.9 g/dL (ref 30.0–36.0)
MCV: 88 fL (ref 78.0–100.0)
Platelets: 236 K/uL (ref 150–400)
RBC: 3.96 MIL/uL (ref 3.87–5.11)
RDW: 14.3 % (ref 11.5–15.5)
WBC: 7 10*3/uL (ref 4.0–10.5)

## 2011-09-27 LAB — URINALYSIS, ROUTINE W REFLEX MICROSCOPIC
Bilirubin Urine: NEGATIVE
Glucose, UA: NEGATIVE mg/dL
Hgb urine dipstick: NEGATIVE
Ketones, ur: NEGATIVE mg/dL
Nitrite: NEGATIVE
Protein, ur: NEGATIVE mg/dL
Specific Gravity, Urine: 1.024 (ref 1.005–1.030)
Urobilinogen, UA: 1 mg/dL (ref 0.0–1.0)
pH: 6 (ref 5.0–8.0)

## 2011-09-27 LAB — GLUCOSE, CAPILLARY

## 2011-09-27 LAB — COMPREHENSIVE METABOLIC PANEL WITH GFR
AST: 24 U/L (ref 0–37)
Albumin: 3.9 g/dL (ref 3.5–5.2)
Calcium: 9.4 mg/dL (ref 8.4–10.5)
Creatinine, Ser: 0.74 mg/dL (ref 0.4–1.2)
GFR calc Af Amer: >60 mL/min (ref >60)
GFR calc non Af Amer: >60 mL/min (ref >60)
Sodium: 137 meq/L (ref 135–145)
Total Protein: 7 g/dL (ref 6.0–8.3)

## 2011-09-27 LAB — DIFFERENTIAL
Basophils Absolute: 0.1 10*3/uL (ref 0.0–0.1)
Basophils Relative: 1 % (ref 0–1)
Eosinophils Absolute: 0.1 10*3/uL (ref 0.0–0.7)
Eosinophils Relative: 2 % (ref 0–5)
Lymphocytes Relative: 44 % (ref 12–46)
Lymphs Abs: 3.1 K/uL (ref 0.7–4.0)
Monocytes Absolute: 0.3 K/uL (ref 0.1–1.0)
Monocytes Relative: 5 % (ref 3–12)
Neutro Abs: 3.4 10*3/uL (ref 1.7–7.7)
Neutrophils Relative %: 48 % (ref 43–77)

## 2011-09-27 LAB — COMPREHENSIVE METABOLIC PANEL
ALT: 19 U/L (ref 0–35)
Alkaline Phosphatase: 74 U/L (ref 39–117)
BUN: 11 mg/dL (ref 6–23)
CO2: 30 mEq/L (ref 19–32)
Chloride: 102 mEq/L (ref 96–112)
Glucose, Bld: 168 mg/dL — ABNORMAL HIGH (ref 70–99)
Potassium: 3.7 mEq/L (ref 3.5–5.1)
Total Bilirubin: 0.3 mg/dL (ref 0.3–1.2)

## 2011-09-27 LAB — LIPASE, BLOOD: Lipase: 19 U/L (ref 11–59)

## 2011-09-27 LAB — WET PREP, GENITAL
Trich, Wet Prep: NONE SEEN
Yeast Wet Prep HPF POC: NONE SEEN

## 2011-09-27 LAB — GC/CHLAMYDIA PROBE AMP, GENITAL
Chlamydia, DNA Probe: NEGATIVE
GC Probe Amp, Genital: NEGATIVE

## 2011-10-01 LAB — CBC
HCT: 34.2 — ABNORMAL LOW
MCV: 85.6
Platelets: 238
RDW: 14.9
WBC: 7.2

## 2011-10-01 LAB — COMPREHENSIVE METABOLIC PANEL
AST: 22
Albumin: 3.7
BUN: 10
Chloride: 101
Creatinine, Ser: 0.56
GFR calc Af Amer: >60
Total Bilirubin: 0.6
Total Protein: 7.1

## 2011-10-01 LAB — DIFFERENTIAL
Basophils Absolute: 0
Eosinophils Relative: 1
Lymphocytes Relative: 23
Lymphs Abs: 1.6
Monocytes Absolute: 0.2
Monocytes Relative: 3
Neutro Abs: 5.2

## 2011-10-01 LAB — POCT CARDIAC MARKERS
CKMB, poc: 3
Myoglobin, poc: 77.9
Troponin i, poc: <0.05

## 2011-10-01 LAB — URINALYSIS, ROUTINE W REFLEX MICROSCOPIC
Hgb urine dipstick: NEGATIVE
Ketones, ur: NEGATIVE
Protein, ur: NEGATIVE
Urobilinogen, UA: 0.2

## 2011-10-07 LAB — RAPID STREP SCREEN (MED CTR MEBANE ONLY): Streptococcus, Group A Screen (Direct): NEGATIVE

## 2011-12-18 ENCOUNTER — Other Ambulatory Visit: Payer: Self-pay | Admitting: Urology

## 2012-01-06 ENCOUNTER — Encounter (HOSPITAL_BASED_OUTPATIENT_CLINIC_OR_DEPARTMENT_OTHER): Payer: Self-pay | Admitting: *Deleted

## 2012-01-06 NOTE — Progress Notes (Signed)
NPO AFTER MN. ARRIVES AT 0930. NEEDS ISTAT AND EKG. CURRENT CXR 07-26-11 W/ CHART AND IN EPIC. WILL TAKE ATENOLOL, NEXIUM, GABAPENTIN, OXYCODONE AND DO QVAR INHALER AM OF SURG. W/ SIPS OF WATER.

## 2012-01-07 ENCOUNTER — Ambulatory Visit (HOSPITAL_BASED_OUTPATIENT_CLINIC_OR_DEPARTMENT_OTHER)
Admission: RE | Admit: 2012-01-07 | Discharge: 2012-01-07 | Disposition: A | Discharge status: Home / Self Care | Payer: Medicaid Other | Source: Ambulatory Visit | Attending: Urology | Admitting: Urology

## 2012-01-07 ENCOUNTER — Encounter (HOSPITAL_BASED_OUTPATIENT_CLINIC_OR_DEPARTMENT_OTHER): Payer: Self-pay | Admitting: Anesthesiology

## 2012-01-07 ENCOUNTER — Ambulatory Visit (HOSPITAL_BASED_OUTPATIENT_CLINIC_OR_DEPARTMENT_OTHER): Payer: Medicaid Other | Admitting: Anesthesiology

## 2012-01-07 ENCOUNTER — Encounter (HOSPITAL_BASED_OUTPATIENT_CLINIC_OR_DEPARTMENT_OTHER)
Admission: RE | Disposition: A | Discharge status: Home / Self Care | Payer: Self-pay | Source: Ambulatory Visit | Attending: Urology

## 2012-01-07 ENCOUNTER — Encounter (HOSPITAL_BASED_OUTPATIENT_CLINIC_OR_DEPARTMENT_OTHER): Payer: Self-pay | Admitting: *Deleted

## 2012-01-07 DIAGNOSIS — Z79899 Other long term (current) drug therapy: Secondary | ICD-10-CM | POA: Insufficient documentation

## 2012-01-07 DIAGNOSIS — N949 Unspecified condition associated with female genital organs and menstrual cycle: Secondary | ICD-10-CM | POA: Insufficient documentation

## 2012-01-07 DIAGNOSIS — I10 Essential (primary) hypertension: Secondary | ICD-10-CM | POA: Insufficient documentation

## 2012-01-07 DIAGNOSIS — H409 Unspecified glaucoma: Secondary | ICD-10-CM | POA: Insufficient documentation

## 2012-01-07 DIAGNOSIS — E7800 Pure hypercholesterolemia, unspecified: Secondary | ICD-10-CM | POA: Insufficient documentation

## 2012-01-07 DIAGNOSIS — G4730 Sleep apnea, unspecified: Secondary | ICD-10-CM | POA: Insufficient documentation

## 2012-01-07 DIAGNOSIS — J45909 Unspecified asthma, uncomplicated: Secondary | ICD-10-CM | POA: Insufficient documentation

## 2012-01-07 DIAGNOSIS — E119 Type 2 diabetes mellitus without complications: Secondary | ICD-10-CM | POA: Insufficient documentation

## 2012-01-07 HISTORY — DX: Urgency of urination: R39.15

## 2012-01-07 HISTORY — DX: Essential (primary) hypertension: I10

## 2012-01-07 HISTORY — DX: Personal history of other diseases of the nervous system and sense organs: Z86.69

## 2012-01-07 HISTORY — DX: Iron deficiency anemia, unspecified: D50.9

## 2012-01-07 HISTORY — DX: Chronic obstructive pulmonary disease, unspecified: J44.9

## 2012-01-07 HISTORY — DX: Chronic pain syndrome: G89.4

## 2012-01-07 HISTORY — DX: Gastro-esophageal reflux disease without esophagitis: K21.9

## 2012-01-07 HISTORY — DX: Frequency of micturition: R35.0

## 2012-01-07 HISTORY — DX: Hyperlipidemia, unspecified: E78.5

## 2012-01-07 HISTORY — DX: Effusion, unspecified ankle: M25.473

## 2012-01-07 HISTORY — DX: Fibromyalgia: M79.7

## 2012-01-07 HISTORY — DX: Unspecified osteoarthritis, unspecified site: M19.90

## 2012-01-07 HISTORY — DX: Nocturia: R35.1

## 2012-01-07 HISTORY — DX: Stress incontinence (female) (male): N39.3

## 2012-01-07 HISTORY — DX: Personal history of other diseases of the digestive system: Z87.19

## 2012-01-07 HISTORY — PX: CYSTO WITH HYDRODISTENSION: SHX5453

## 2012-01-07 LAB — POCT I-STAT 4, (NA,K, GLUC, HGB,HCT)
HCT: 34 % — ABNORMAL LOW (ref 36.0–46.0)
Hemoglobin: 11.6 g/dL — ABNORMAL LOW (ref 12.0–15.0)

## 2012-01-07 SURGERY — CYSTOSCOPY, WITH BLADDER HYDRODISTENSION
Anesthesia: General | Site: Bladder | Wound class: Clean Contaminated

## 2012-01-07 MED ORDER — LACTATED RINGERS IV SOLN
INTRAVENOUS | Status: DC
  Administered 2012-01-07: 09:00:00 via INTRAVENOUS
  Administered 2012-01-07: 100 mL/h via INTRAVENOUS

## 2012-01-07 MED ORDER — PROPOFOL 10 MG/ML IV EMUL
INTRAVENOUS | Status: DC | PRN
  Administered 2012-01-07: 200 mg via INTRAVENOUS

## 2012-01-07 MED ORDER — CIPROFLOXACIN HCL 250 MG PO TABS: 250.0000 mg | ORAL_TABLET | Freq: Two times a day (BID) | ORAL | Status: AC

## 2012-01-07 MED ORDER — LIDOCAINE HCL (CARDIAC) 20 MG/ML IV SOLN
INTRAVENOUS | Status: DC | PRN
  Administered 2012-01-07: 80 mg via INTRAVENOUS

## 2012-01-07 MED ORDER — DEXAMETHASONE SODIUM PHOSPHATE 4 MG/ML IJ SOLN
INTRAMUSCULAR | Status: DC | PRN
  Administered 2012-01-07: 8 mg via INTRAVENOUS

## 2012-01-07 MED ORDER — FENTANYL CITRATE 0.05 MG/ML IJ SOLN: 25.0000 ug | INTRAMUSCULAR | Status: DC | PRN

## 2012-01-07 MED ORDER — FENTANYL CITRATE 0.05 MG/ML IJ SOLN
INTRAMUSCULAR | Status: DC | PRN
  Administered 2012-01-07 (×2): 50 ug via INTRAVENOUS

## 2012-01-07 MED ORDER — PROMETHAZINE HCL 25 MG/ML IJ SOLN: 6.2500 mg | INTRAMUSCULAR | Status: DC | PRN

## 2012-01-07 MED ORDER — CIPROFLOXACIN IN D5W 400 MG/200ML IV SOLN
400.0000 mg | INTRAVENOUS | Status: AC
  Administered 2012-01-07: 400 mg via INTRAVENOUS

## 2012-01-07 MED ORDER — ONDANSETRON HCL 4 MG/2ML IJ SOLN
INTRAMUSCULAR | Status: DC | PRN
  Administered 2012-01-07: 4 mg via INTRAVENOUS

## 2012-01-07 MED ORDER — PHENAZOPYRIDINE HCL 200 MG PO TABS
ORAL | Status: DC | PRN
  Administered 2012-01-07: 10:00:00 via INTRAVESICAL

## 2012-01-07 MED ORDER — HYDROCODONE-ACETAMINOPHEN 5-500 MG PO TABS: 1.0000 | ORAL_TABLET | Freq: Four times a day (QID) | ORAL | Status: AC | PRN

## 2012-01-07 MED ORDER — STERILE WATER FOR IRRIGATION IR SOLN
Status: DC | PRN
  Administered 2012-01-07: 1

## 2012-01-07 MED ORDER — HYDROCODONE-ACETAMINOPHEN 5-325 MG PO TABS
1.0000 | ORAL_TABLET | Freq: Four times a day (QID) | ORAL | Status: AC | PRN
  Administered 2012-01-07: 1 via ORAL

## 2012-01-07 SURGICAL SUPPLY — 21 items
BAG DRAIN URO-CYSTO SKYTR STRL (DRAIN) ×2 IMPLANT
CANISTER SUCT LVC 12 LTR MEDI- (MISCELLANEOUS) ×2 IMPLANT
CATH FOLEY 2WAY SLVR  5CC 18FR (CATHETERS)
CATH FOLEY 2WAY SLVR 5CC 18FR (CATHETERS) IMPLANT
CATH ROBINSON RED A/P 12FR (CATHETERS) IMPLANT
CATH ROBINSON RED A/P 14FR (CATHETERS) IMPLANT
CLOTH BEACON ORANGE TIMEOUT ST (SAFETY) ×2 IMPLANT
DRAPE CAMERA CLOSED 9X96 (DRAPES) ×2 IMPLANT
ELECT REM PT RETURN 9FT ADLT (ELECTROSURGICAL)
ELECTRODE REM PT RTRN 9FT ADLT (ELECTROSURGICAL) IMPLANT
GLOVE BIO SURGEON STRL SZ7.5 (GLOVE) ×2 IMPLANT
GLOVE BIOGEL PI IND STRL 6.5 (GLOVE) ×1 IMPLANT
GLOVE BIOGEL PI INDICATOR 6.5 (GLOVE) ×1
GOWN BRE IMP SLV AUR LG STRL (GOWN DISPOSABLE) ×2 IMPLANT
GOWN STRL REIN XL XLG (GOWN DISPOSABLE) ×2 IMPLANT
NDL SAFETY ECLIPSE 18X1.5 (NEEDLE) ×1 IMPLANT
NEEDLE HYPO 18GX1.5 SHARP (NEEDLE) ×1
PACK CYSTOSCOPY (CUSTOM PROCEDURE TRAY) ×2 IMPLANT
SUT SILK 0 TIES 10X30 (SUTURE) IMPLANT
SYR 20CC LL (SYRINGE) ×2 IMPLANT
WATER STERILE IRR 3000ML UROMA (IV SOLUTION) ×2 IMPLANT

## 2012-01-07 NOTE — Anesthesia Procedure Notes (Signed)
Procedure Name: LMA Insertion Date/Time: 01/07/2012 9:52 AM Performed by: Lorrin Jackson Pre-anesthesia Checklist: Patient identified, Emergency Drugs available, Suction available and Patient being monitored Patient Re-evaluated:Patient Re-evaluated prior to inductionOxygen Delivery Method: Circle System Utilized Preoxygenation: Pre-oxygenation with 100% oxygen Intubation Type: IV induction Ventilation: Mask ventilation without difficulty LMA: LMA with gastric port inserted LMA Size: 4.0 Number of attempts: 1 Placement Confirmation: positive ETCO2 Tube secured with: Tape Dental Injury: Teeth and Oropharynx as per pre-operative assessment

## 2012-01-07 NOTE — H&P (Signed)
History of Present Illness   I dictated a note last time on [name] regarding her significant abdominal pain and her mild mixed stress urge incontinence and frequency. She takes chronic pain medicine for fibromyalgia. Medicaid would not cover a CT Scan, which was surprising. She has ongoing dyspareunia.   Review of systems: No change in bowel or neurologic status.   Urinalysis: I reviewed, negative.   [name] has had an ultrasound of her pelvis I believe in April and she has 5 boys and they are considering a hysterectomy.    Past Medical History Problems  1. History of  Arthritis V13.4 2. History of  Asthma 493.90 3. History of  Chronic Liver Disease 571.9 4. History of  Diabetes Mellitus 250.00 5. History of  Glaucoma 365.9 6. History of  Heartburn 787.1 7. History of  Hepatitis 573.3 8. History of  Hypercholesterolemia 272.0 9. History of  Hypertension 401.9 10. History of  Murmurs 785.2 11. History of  Sinus Arrhythmia 427.9 12. History of  Sleep Apnea 780.57 13. Tobacco Use 305.1  Surgical History Problems  1. History of  Breast Surgery 2. History of  Colon Surgery 3. History of  Neck Repair 4. History of  Tubal Ligation V25.2  Current Meds 1. Acetaminophen CAPS; Therapy: (Recorded:14Dec2012) to 2. Albuterol Sulfate (2.5 MG/3ML) 0.083% Inhalation Nebulization Solution; Therapy:  (Recorded:14Dec2012) to 3. Atenolol TABS; Therapy: (Recorded:14Dec2012) to 4. Cetirizine HCl 10 MG Oral Tablet Chewable; Therapy: (Recorded:14Dec2012) to 5. Diazepam 5 MG Oral Tablet; Therapy: (Recorded:14Dec2012) to 6. Flonase 50 MCG/ACT Nasal Suspension; Therapy: (Recorded:14Dec2012) to 7. Gabapentin 600 MG Oral Tablet; Therapy: (Recorded:14Dec2012) to 8. GlipiZIDE 10 MG Oral Tablet; Therapy: (Recorded:14Dec2012) to 9. Ibuprofen 800 MG Oral Tablet; Therapy: (Recorded:14Dec2012) to 10. Iron TABS; Therapy: (Recorded:14Dec2012) to 11. MetFORMIN HCl 850 MG Oral Tablet; Therapy:  (Recorded:14Dec2012) to 12. Methocarbamol 750 MG Oral Tablet; Therapy: (Recorded:14Dec2012) to 13. NexIUM 20 MG Oral Capsule Delayed Release; Therapy: (Recorded:14Dec2012) to 14. OxyCODONE HCl 30 MG Oral Tablet; Therapy: (Recorded:14Dec2012) to 15. Qvar AERS; Therapy: (Recorded:14Dec2012) to 16. Simvastatin 40 MG Oral Tablet; Therapy: (Recorded:14Dec2012) to 17. Vitamin D (Ergocalciferol) 50000 UNIT Oral Capsule; Therapy: (Recorded:14Dec2012) to  Allergies Medication  1. Avapro TABS 2. Latex Exam Gloves MISC 3. Lisinopril TABS  Family History Problems  1. Maternal grandfather's history of  Diabetes Mellitus V18.0 2. Maternal history of  Hematuria 3. Maternal history of  Hypertension V17.49 4. Maternal history of  Uterine Cancer V16.49  Social History Problems  1. Caffeine Use 2. Former Smoker V15.82 3. Marital History - Currently Married 4. Occupation: disabled Denied  5. History of  Alcohol Use  Vitals Vital Signs [Data Includes: Last 1 Day]  21Dec2012 10:29AM  Blood Pressure: 166 / 102 Temperature: 99 F Heart Rate: 76  Results/Data  Urine [Data Includes: Last 1 Day]   21Dec2012  COLOR YELLOW   APPEARANCE CLEAR   SPECIFIC GRAVITY 1.010   pH 7.0   GLUCOSE NEG mg/dL  BILIRUBIN NEG   KETONE NEG mg/dL  BLOOD NEG   PROTEIN NEG mg/dL  UROBILINOGEN 0.2 mg/dL  NITRITE NEG   LEUKOCYTE ESTERASE NEG    Assessment Assessed  1. Abdominal Pain 789.00 2. Dyspareunia 625.0 3. Urge And Stress Incontinence 788.33  Plan   Discussion/Summary   [name] would like to proceed with a hydrodistension. I think this is safe to do so and prudent.  After a thorough review of the management options for the patient's condition the patient  elected to proceed with surgical  therapy as noted above. We have discussed the potential benefits and risks of the procedure, side effects of the proposed treatment, the likelihood of the patient achieving the goals of the procedure, and any  potential problems that might occur during the procedure or recuperation. Informed consent has been obtained.

## 2012-01-07 NOTE — Anesthesia Postprocedure Evaluation (Signed)
  Anesthesia Post-op Note  Patient: [name]  Procedure(s) Performed:  CYSTOSCOPY/HYDRODISTENSION -  OF BLADDER WITH INSTILLATION OF MARCAINE AND PRYDIUM  Patient Location: PACU  Anesthesia Type: General  Level of Consciousness: awake and alert   Airway and Oxygen Therapy: Patient Spontanous Breathing  Post-op Pain: mild  Post-op Assessment: Post-op Vital signs reviewed, Patient's Cardiovascular Status Stable, Respiratory Function Stable, Patent Airway and No signs of Nausea or vomiting  Post-op Vital Signs: stable  Complications: No apparent anesthesia complications

## 2012-01-07 NOTE — Op Note (Signed)
Preoperative diagnosis: Pelvic pain Operative diagnosis: Pelvic pain Surgery bladder hydrodistention and cystoscopy and bladder instillation therapy Surgeon Dr. Alfredo Martinez  The patient has pelvic and frequency and consented to the procedure. Latex allergy precautions were performed. Preoperative antibiotics were given. 22 [place] scope was utilized.  Water mucosa and trigone were normal. Is no stitch form other carcinoma. Trigone was normal. She was hydrodistended 600 mL. Bladder was emptied. I reinspected and there was no findings tepee with a diagnosis of interstitial cystitis. There is no bladder injury.  A separate procedure using a latex free catheter I instilled 15 cc of 0.5% Marcaine +400 mg a pretty  The patient to be taken to recovery room and follow my usual protocol and the treatment diagnosis of pelvic pain

## 2012-01-07 NOTE — Transfer of Care (Signed)
Immediate Anesthesia Transfer of Care Note  Patient: LAKYSHA KOSSMAN  Procedure(s) Performed:  CYSTOSCOPY/HYDRODISTENSION -  OF BLADDER WITH INSTILLATION OF MARCAINE AND PRYDIUM  Patient Location: Patient transported to PACU with oxygen via face mask at 6 Liters / Min  Anesthesia Type: General  Level of Consciousness: awake and alert   Airway & Oxygen Therapy: Patient Spontanous Breathing and Patient connected to face mask oxygen Post-op Assessment: Report given to PACU RN and Post -op Vital signs reviewed and stable  Post vital signs: Reviewed and stable  Complications: No apparent anesthesia complications  Filed Vitals:   01/07/12 0937  BP: 108/69  Pulse: 62  Temp: 36.7 C  Resp: 18

## 2012-01-07 NOTE — Anesthesia Preprocedure Evaluation (Signed)
Anesthesia Evaluation  Patient identified by MRN, date of birth, ID band Patient awake    Reviewed: Allergy & Precautions, H&P , NPO status , Patient's Chart, lab work & pertinent test results  Airway Mallampati: III TM Distance: <3 FB Neck ROM: Full    Dental No notable dental hx.    Pulmonary neg pulmonary ROS, COPD clear to auscultation  Pulmonary exam normal       Cardiovascular hypertension, neg cardio ROS Regular Normal    Neuro/Psych Negative Neurological ROS  Negative Psych ROS   GI/Hepatic negative GI ROS, Neg liver ROS, GERD-  ,  Endo/Other  Negative Endocrine ROSDiabetes mellitus-Morbid obesity  Renal/GU negative Renal ROS  Genitourinary negative   Musculoskeletal negative musculoskeletal ROS (+) Fibromyalgia -  Abdominal   Peds negative pediatric ROS (+)  Hematology negative hematology ROS (+)   Anesthesia Other Findings   Reproductive/Obstetrics negative OB ROS                           Anesthesia Physical Anesthesia Plan  ASA: III  Anesthesia Plan: General   Post-op Pain Management:    Induction: Intravenous  Airway Management Planned: LMA and Oral ETT  Additional Equipment:   Intra-op Plan:   Post-operative Plan: Extubation in OR  Informed Consent: I have reviewed the patients History and Physical, chart, labs and discussed the procedure including the risks, benefits and alternatives for the proposed anesthesia with the patient or authorized representative who has indicated his/her understanding and acceptance.   Dental advisory given  Plan Discussed with: CRNA  Anesthesia Plan Comments:         Anesthesia Quick Evaluation

## 2012-01-07 NOTE — Interval H&P Note (Signed)
History and Physical Interval Note:  01/07/2012 7:08 AM  [name]  has presented today for surgery, with the diagnosis of PELVIC PAIN  The various methods of treatment have been discussed with the patient and family. After consideration of risks, benefits and other options for treatment, the patient has consented to  Procedure(s): CYSTOSCOPY/HYDRODISTENSION as Jackson surgical intervention .  The patients' history has been reviewed, patient examined, no change in status, stable for surgery.  I have reviewed the patients' chart and labs.  Questions were answered to the patient's satisfaction.     [name]

## 2012-01-09 ENCOUNTER — Encounter (HOSPITAL_BASED_OUTPATIENT_CLINIC_OR_DEPARTMENT_OTHER): Payer: Self-pay | Admitting: Urology

## 2012-02-27 ENCOUNTER — Other Ambulatory Visit (HOSPITAL_COMMUNITY): Payer: Self-pay | Admitting: Internal Medicine

## 2012-02-27 DIAGNOSIS — Z1231 Encounter for screening mammogram for malignant neoplasm of breast: Secondary | ICD-10-CM

## 2012-04-02 ENCOUNTER — Ambulatory Visit: Payer: Medicaid Other | Admitting: Obstetrics & Gynecology

## 2012-04-29 ENCOUNTER — Ambulatory Visit (HOSPITAL_COMMUNITY): Admission: RE | Admit: 2012-04-29 | Payer: Medicaid Other | Source: Ambulatory Visit

## 2012-05-14 ENCOUNTER — Other Ambulatory Visit (HOSPITAL_COMMUNITY)
Admission: RE | Admit: 2012-05-14 | Discharge: 2012-05-14 | Disposition: A | Discharge status: Home / Self Care | Payer: Medicaid Other | Source: Ambulatory Visit | Attending: Obstetrics and Gynecology | Admitting: Obstetrics and Gynecology

## 2012-05-14 ENCOUNTER — Other Ambulatory Visit: Payer: Self-pay | Admitting: Obstetrics and Gynecology

## 2012-05-14 DIAGNOSIS — Z01419 Encounter for gynecological examination (general) (routine) without abnormal findings: Secondary | ICD-10-CM | POA: Insufficient documentation

## 2012-06-15 ENCOUNTER — Other Ambulatory Visit: Payer: Self-pay | Admitting: Obstetrics and Gynecology

## 2012-06-16 ENCOUNTER — Encounter (HOSPITAL_COMMUNITY): Payer: Self-pay

## 2012-06-16 ENCOUNTER — Encounter (HOSPITAL_COMMUNITY)
Admission: RE | Admit: 2012-06-16 | Discharge: 2012-06-16 | Disposition: A | Discharge status: Home / Self Care | Payer: Medicare Other | Source: Ambulatory Visit | Attending: Obstetrics and Gynecology | Admitting: Obstetrics and Gynecology

## 2012-06-16 LAB — CBC
Hemoglobin: 10.7 g/dL — ABNORMAL LOW (ref 12.0–15.0)
MCH: 27.6 pg (ref 26.0–34.0)
MCHC: 32.2 g/dL (ref 30.0–36.0)
Platelets: 207 10*3/uL (ref 150–400)
RBC: 3.88 MIL/uL (ref 3.87–5.11)

## 2012-06-16 LAB — BASIC METABOLIC PANEL
Calcium: 9.9 mg/dL (ref 8.4–10.5)
GFR calc Af Amer: >90 mL/min (ref >90)
GFR calc non Af Amer: >90 mL/min (ref >90)
Potassium: 4 mEq/L (ref 3.5–5.1)
Sodium: 140 mEq/L (ref 135–145)

## 2012-06-16 LAB — SURGICAL PCR SCREEN
MRSA, PCR: NEGATIVE
Staphylococcus aureus: NEGATIVE

## 2012-06-16 NOTE — Patient Instructions (Addendum)
[name]  06/16/2012   Your procedure is scheduled on:  06/22/12  Enter through the Main Entrance of [facility] at 1130 AM.  Pick up the phone at the desk and dial [phone].   Call this number if you have problems the morning of surgery: [phone]   Remember:   Do not eat food:After Midnight.  Do not drink clear liquids: After Midnight.  Take these medicines the morning of surgery with A SIP OF WATER: Hold oral diabetes medications for 24hrs before surgery, bring inhalers, take blood pressure medications, hold muscle relaxant, hold Zocor   Do not wear jewelry, make-up or nail polish.  Do not wear lotions, powders, or perfumes. You may wear deodorant.  Do not shave 48 hours prior to surgery.  Do not bring valuables to the hospital.  Contacts, dentures or bridgework may not be worn into surgery.  Leave suitcase in the car. After surgery it may be brought to your room.  For patients admitted to the hospital, checkout time is 11:00 AM the day of discharge.   Patients discharged the day of surgery will not be allowed to drive home.  Name and phone number of your driver: NA  Special Instructions: CHG Shower Use Special Wash: 1/2 bottle night before surgery and 1/2 bottle morning of surgery.   Please read over the following fact sheets that you were given: MRSA Information

## 2012-06-16 NOTE — Anesthesia Preprocedure Evaluation (Addendum)
Anesthesia Evaluation  Patient identified by MRN, date of birth, ID band Patient awake    Reviewed: Allergy & Precautions, H&P , NPO status , Patient's Chart, lab work & pertinent test results  Airway Mallampati: I TM Distance: >3 FB Neck ROM: full    Dental No notable dental hx. (+) Teeth Intact   Pulmonary asthma , COPD Sarcoidosis   Pulmonary exam normal       Cardiovascular hypertension, Pt. on medications and Pt. on home beta blockers     Neuro/Psych PSYCHIATRIC DISORDERS Depression    GI/Hepatic hiatal hernia, GERD-  ,  Endo/Other  Morbid obesity  Renal/GU   negative genitourinary   Musculoskeletal   Abdominal (+) + obese,   Peds negative pediatric ROS (+)  Hematology negative hematology ROS (+)   Anesthesia Other Findings Hypertension     GERD (gastroesophageal reflux disease)        Depression     History of cardiac arrhythmia STATES NO ISSUES  FOR A LONG TIME ATENOLOL CONTROLS PALPITATIONS    Non-ischemic cardiomyopathy ECHO IN 2006 W/ CHART CARDIAC CATH 2009  W/ CHART History of echocardiogram 05-24-2005   NORMAL LVSF ,  EF  50-60%      Sarcoidosis     Positive TB test        Chronic bronchitis     History of Bell's palsy 2002--  LEFT SIDE-- NO RESIDUAL       Pelvic pain in female     Frequency of urination        Urgency of urination     Stress incontinence, female        Nocturia     Chronic pain syndrome        History of glaucoma STATES TREATED FOR ABOUT 5 YRS  AGO  --- NO ISSUES SINCE   Uterine fibroid        Ankle edema BILATERAL   Hyperlipidemia        COPD (chronic obstructive pulmonary disease) CHRONIC BRONCHITIS --  LAST BOUT  BILATERAL PNEUMONIA 2009   Iron deficiency anemia        H/O hiatal hernia     Fibromyalgia        Arthritis   BACK, KNEE, HIPS Chronic renal insufficiency        Reproductive/Obstetrics negative OB ROS                          Anesthesia  Physical Anesthesia Plan  ASA: III  Anesthesia Plan: General   Post-op Pain Management:    Induction: Intravenous  Airway Management Planned: Oral ETT  Additional Equipment: Arterial line  Intra-op Plan:   Post-operative Plan: Possible Post-op intubation/ventilation  Informed Consent: I have reviewed the patients History and Physical, chart, labs and discussed the procedure including the risks, benefits and alternatives for the proposed anesthesia with the patient or authorized representative who has indicated his/her understanding and acceptance.   Dental Advisory Given  Plan Discussed with: CRNA and Surgeon  Anesthesia Plan Comments:         Anesthesia Quick Evaluation

## 2012-06-21 MED ORDER — DEXTROSE 5 % IV SOLN
2.0000 g | INTRAVENOUS | Status: AC
  Administered 2012-06-22: 2 g via INTRAVENOUS
  Filled 2012-06-21: qty 2

## 2012-06-22 ENCOUNTER — Encounter (HOSPITAL_COMMUNITY): Payer: Self-pay | Admitting: Anesthesiology

## 2012-06-22 ENCOUNTER — Encounter (HOSPITAL_COMMUNITY): Payer: Self-pay | Admitting: Certified Registered Nurse Anesthetist

## 2012-06-22 ENCOUNTER — Ambulatory Visit (HOSPITAL_COMMUNITY)
Admission: RE | Admit: 2012-06-22 | Discharge: 2012-06-23 | Disposition: A | Discharge status: Home / Self Care | Payer: Medicare Other | Source: Ambulatory Visit | Attending: Obstetrics and Gynecology | Admitting: Obstetrics and Gynecology

## 2012-06-22 ENCOUNTER — Inpatient Hospital Stay (HOSPITAL_COMMUNITY): Payer: Medicare Other | Admitting: Certified Registered Nurse Anesthetist

## 2012-06-22 ENCOUNTER — Encounter (HOSPITAL_COMMUNITY)
Admission: RE | Disposition: A | Discharge status: Home / Self Care | Payer: Self-pay | Source: Ambulatory Visit | Attending: Obstetrics and Gynecology

## 2012-06-22 ENCOUNTER — Encounter (HOSPITAL_COMMUNITY): Payer: Self-pay | Admitting: *Deleted

## 2012-06-22 DIAGNOSIS — Z01818 Encounter for other preprocedural examination: Secondary | ICD-10-CM | POA: Insufficient documentation

## 2012-06-22 DIAGNOSIS — Z90710 Acquired absence of both cervix and uterus: Secondary | ICD-10-CM

## 2012-06-22 DIAGNOSIS — IMO0001 Reserved for inherently not codable concepts without codable children: Secondary | ICD-10-CM | POA: Insufficient documentation

## 2012-06-22 DIAGNOSIS — I10 Essential (primary) hypertension: Secondary | ICD-10-CM | POA: Insufficient documentation

## 2012-06-22 DIAGNOSIS — N949 Unspecified condition associated with female genital organs and menstrual cycle: Principal | ICD-10-CM | POA: Insufficient documentation

## 2012-06-22 DIAGNOSIS — E119 Type 2 diabetes mellitus without complications: Secondary | ICD-10-CM | POA: Insufficient documentation

## 2012-06-22 DIAGNOSIS — D259 Leiomyoma of uterus, unspecified: Secondary | ICD-10-CM | POA: Insufficient documentation

## 2012-06-22 DIAGNOSIS — Z01812 Encounter for preprocedural laboratory examination: Secondary | ICD-10-CM | POA: Insufficient documentation

## 2012-06-22 LAB — TYPE AND SCREEN: Antibody Screen: NEGATIVE

## 2012-06-22 SURGERY — ROBOTIC ASSISTED TOTAL HYSTERECTOMY WITH BILATERAL SALPINGO OOPHORECTOMY
Anesthesia: General | Site: Abdomen | Laterality: Bilateral | Wound class: Clean Contaminated

## 2012-06-22 MED ORDER — FENTANYL CITRATE 0.05 MG/ML IJ SOLN
INTRAMUSCULAR | Status: DC | PRN
  Administered 2012-06-22 (×3): 100 ug via INTRAVENOUS
  Administered 2012-06-22 (×3): 50 ug via INTRAVENOUS

## 2012-06-22 MED ORDER — ALBUTEROL SULFATE HFA 108 (90 BASE) MCG/ACT IN AERS: 2.0000 | INHALATION_SPRAY | RESPIRATORY_TRACT | Status: DC | PRN

## 2012-06-22 MED ORDER — FENTANYL CITRATE 0.05 MG/ML IJ SOLN
INTRAMUSCULAR | Status: AC
  Filled 2012-06-22: qty 2

## 2012-06-22 MED ORDER — ONDANSETRON HCL 4 MG/2ML IJ SOLN
INTRAMUSCULAR | Status: DC | PRN
  Administered 2012-06-22: 4 mg via INTRAVENOUS

## 2012-06-22 MED ORDER — FLUTICASONE PROPIONATE HFA 44 MCG/ACT IN AERO
2.0000 | INHALATION_SPRAY | Freq: Two times a day (BID) | RESPIRATORY_TRACT | Status: DC
  Administered 2012-06-23: 2 via RESPIRATORY_TRACT
  Filled 2012-06-22: qty 10.6

## 2012-06-22 MED ORDER — ARTIFICIAL TEARS OP OINT
TOPICAL_OINTMENT | OPHTHALMIC | Status: DC | PRN
  Administered 2012-06-22: 1 via OPHTHALMIC

## 2012-06-22 MED ORDER — EPHEDRINE SULFATE 50 MG/ML IJ SOLN
INTRAMUSCULAR | Status: AC
  Filled 2012-06-22: qty 1

## 2012-06-22 MED ORDER — SODIUM CHLORIDE 0.9 % IJ SOLN
INTRAMUSCULAR | Status: DC | PRN
  Administered 2012-06-22: 60 mL via INTRAVENOUS

## 2012-06-22 MED ORDER — LIDOCAINE HCL (CARDIAC) 20 MG/ML IV SOLN
INTRAVENOUS | Status: AC
  Filled 2012-06-22: qty 5

## 2012-06-22 MED ORDER — ROPIVACAINE HCL 5 MG/ML IJ SOLN
INTRAMUSCULAR | Status: DC | PRN
  Administered 2012-06-22: 60 mL via EPIDURAL

## 2012-06-22 MED ORDER — ONDANSETRON HCL 4 MG/2ML IJ SOLN
INTRAMUSCULAR | Status: AC
  Filled 2012-06-22: qty 2

## 2012-06-22 MED ORDER — IBUPROFEN 600 MG PO TABS: 600.0000 mg | ORAL_TABLET | Freq: Four times a day (QID) | ORAL | Status: DC | PRN

## 2012-06-22 MED ORDER — KETOROLAC TROMETHAMINE 30 MG/ML IJ SOLN
30.0000 mg | Freq: Three times a day (TID) | INTRAMUSCULAR | Status: DC
  Administered 2012-06-22 – 2012-06-23 (×2): 30 mg via INTRAVENOUS
  Filled 2012-06-22 (×2): qty 1

## 2012-06-22 MED ORDER — MONTELUKAST SODIUM 10 MG PO TABS
10.0000 mg | ORAL_TABLET | Freq: Every day | ORAL | Status: DC
  Administered 2012-06-23: 10 mg via ORAL
  Filled 2012-06-22 (×3): qty 1

## 2012-06-22 MED ORDER — EPHEDRINE SULFATE 50 MG/ML IJ SOLN
INTRAMUSCULAR | Status: DC | PRN
  Administered 2012-06-22: 10 mg via INTRAVENOUS

## 2012-06-22 MED ORDER — ATENOLOL 50 MG PO TABS
75.0000 mg | ORAL_TABLET | Freq: Every day | ORAL | Status: DC
  Administered 2012-06-23: 75 mg via ORAL
  Filled 2012-06-22 (×2): qty 1

## 2012-06-22 MED ORDER — HYDROMORPHONE HCL PF 1 MG/ML IJ SOLN
0.2500 mg | INTRAMUSCULAR | Status: DC | PRN
  Administered 2012-06-22 (×4): 0.5 mg via INTRAVENOUS

## 2012-06-22 MED ORDER — HYDROMORPHONE HCL PF 1 MG/ML IJ SOLN
INTRAMUSCULAR | Status: AC
  Filled 2012-06-22: qty 1

## 2012-06-22 MED ORDER — OXYCODONE HCL 5 MG PO TABS
30.0000 mg | ORAL_TABLET | Freq: Four times a day (QID) | ORAL | Status: DC
  Administered 2012-06-23 (×2): 30 mg via ORAL
  Filled 2012-06-22: qty 1
  Filled 2012-06-22: qty 4
  Filled 2012-06-22: qty 6
  Filled 2012-06-22: qty 1

## 2012-06-22 MED ORDER — HYDROMORPHONE HCL PF 1 MG/ML IJ SOLN
0.5000 mg | INTRAMUSCULAR | Status: DC | PRN
  Administered 2012-06-22 (×3): 1 mg via INTRAVENOUS

## 2012-06-22 MED ORDER — KETOROLAC TROMETHAMINE 30 MG/ML IJ SOLN
INTRAMUSCULAR | Status: AC
  Filled 2012-06-22: qty 1

## 2012-06-22 MED ORDER — MIDAZOLAM HCL 2 MG/2ML IJ SOLN
INTRAMUSCULAR | Status: AC
  Filled 2012-06-22: qty 2

## 2012-06-22 MED ORDER — LACTATED RINGERS IV SOLN
INTRAVENOUS | Status: DC
  Administered 2012-06-22 (×3): via INTRAVENOUS
  Administered 2012-06-22: 125 mL/h via INTRAVENOUS

## 2012-06-22 MED ORDER — DIPHENHYDRAMINE HCL 50 MG/ML IJ SOLN: 12.5000 mg | Freq: Four times a day (QID) | INTRAMUSCULAR | Status: DC | PRN

## 2012-06-22 MED ORDER — TRIAMTERENE-HCTZ 37.5-25 MG PO TABS
1.0000 | ORAL_TABLET | Freq: Every day | ORAL | Status: DC
  Administered 2012-06-23: 1 via ORAL
  Filled 2012-06-22 (×2): qty 1

## 2012-06-22 MED ORDER — FENTANYL CITRATE 0.05 MG/ML IJ SOLN
INTRAMUSCULAR | Status: AC
  Filled 2012-06-22: qty 5

## 2012-06-22 MED ORDER — KETOROLAC TROMETHAMINE 30 MG/ML IJ SOLN
15.0000 mg | Freq: Once | INTRAMUSCULAR | Status: AC | PRN
  Administered 2012-06-22: 30 mg via INTRAVENOUS

## 2012-06-22 MED ORDER — DIPHENHYDRAMINE HCL 12.5 MG/5ML PO ELIX: 12.5000 mg | ORAL_SOLUTION | Freq: Four times a day (QID) | ORAL | Status: DC | PRN

## 2012-06-22 MED ORDER — PROPOFOL 10 MG/ML IV EMUL
INTRAVENOUS | Status: AC
  Filled 2012-06-22: qty 20

## 2012-06-22 MED ORDER — PANTOPRAZOLE SODIUM 40 MG PO TBEC
80.0000 mg | DELAYED_RELEASE_TABLET | Freq: Every day | ORAL | Status: DC
  Filled 2012-06-22 (×2): qty 2

## 2012-06-22 MED ORDER — INSULIN ASPART 100 UNIT/ML ~~LOC~~ SOLN
0.0000 [IU] | SUBCUTANEOUS | Status: DC
  Administered 2012-06-23: 2 [IU] via SUBCUTANEOUS

## 2012-06-22 MED ORDER — GLIPIZIDE 10 MG PO TABS
10.0000 mg | ORAL_TABLET | Freq: Two times a day (BID) | ORAL | Status: DC
  Administered 2012-06-23: 10 mg via ORAL
  Filled 2012-06-22 (×3): qty 1

## 2012-06-22 MED ORDER — GLYCOPYRROLATE 0.2 MG/ML IJ SOLN
INTRAMUSCULAR | Status: AC
  Filled 2012-06-22: qty 1

## 2012-06-22 MED ORDER — GABAPENTIN 300 MG PO CAPS
600.0000 mg | ORAL_CAPSULE | Freq: Three times a day (TID) | ORAL | Status: DC
  Administered 2012-06-22 – 2012-06-23 (×2): 600 mg via ORAL
  Filled 2012-06-22 (×6): qty 2

## 2012-06-22 MED ORDER — LACTATED RINGERS IR SOLN
Status: DC | PRN
  Administered 2012-06-22: 3000 mL

## 2012-06-22 MED ORDER — ONDANSETRON HCL 4 MG/2ML IJ SOLN: 4.0000 mg | Freq: Four times a day (QID) | INTRAMUSCULAR | Status: DC | PRN

## 2012-06-22 MED ORDER — ROCURONIUM BROMIDE 50 MG/5ML IV SOLN
INTRAVENOUS | Status: AC
  Filled 2012-06-22: qty 1

## 2012-06-22 MED ORDER — HYDROMORPHONE HCL PF 1 MG/ML IJ SOLN
INTRAMUSCULAR | Status: AC
  Administered 2012-06-22: 1 mg via INTRAVENOUS
  Filled 2012-06-22: qty 1

## 2012-06-22 MED ORDER — STERILE WATER FOR IRRIGATION IR SOLN: Status: DC | PRN

## 2012-06-22 MED ORDER — ARTIFICIAL TEARS OP OINT
TOPICAL_OINTMENT | OPHTHALMIC | Status: AC
  Filled 2012-06-22: qty 3.5

## 2012-06-22 MED ORDER — HYDROMORPHONE HCL PF 1 MG/ML IJ SOLN
INTRAMUSCULAR | Status: AC
  Administered 2012-06-22: 1 mg via INTRAVENOUS
  Filled 2012-06-22: qty 2

## 2012-06-22 MED ORDER — KETOROLAC TROMETHAMINE 30 MG/ML IJ SOLN
INTRAMUSCULAR | Status: AC
  Administered 2012-06-22: 30 mg via INTRAVENOUS
  Filled 2012-06-22: qty 1

## 2012-06-22 MED ORDER — NALOXONE HCL 0.4 MG/ML IJ SOLN: 0.4000 mg | INTRAMUSCULAR | Status: DC | PRN

## 2012-06-22 MED ORDER — HYDROMORPHONE HCL PF 1 MG/ML IJ SOLN
INTRAMUSCULAR | Status: AC
  Administered 2012-06-22: 0.5 mg via INTRAVENOUS
  Filled 2012-06-22: qty 1

## 2012-06-22 MED ORDER — MIDAZOLAM HCL 5 MG/5ML IJ SOLN
INTRAMUSCULAR | Status: DC | PRN
  Administered 2012-06-22: 1.5 mg via INTRAVENOUS
  Administered 2012-06-22: .5 mg via INTRAVENOUS

## 2012-06-22 MED ORDER — SODIUM CHLORIDE 0.9 % IJ SOLN: 9.0000 mL | INTRAMUSCULAR | Status: DC | PRN

## 2012-06-22 MED ORDER — NEOSTIGMINE METHYLSULFATE 1 MG/ML IJ SOLN
INTRAMUSCULAR | Status: DC | PRN
  Administered 2012-06-22: 2 mg via INTRAVENOUS

## 2012-06-22 MED ORDER — LIDOCAINE HCL (CARDIAC) 20 MG/ML IV SOLN
INTRAVENOUS | Status: DC | PRN
  Administered 2012-06-22: 100 mg via INTRAVENOUS

## 2012-06-22 MED ORDER — ROCURONIUM BROMIDE 100 MG/10ML IV SOLN
INTRAVENOUS | Status: DC | PRN
  Administered 2012-06-22: 50 mg via INTRAVENOUS
  Administered 2012-06-22: 10 mg via INTRAVENOUS
  Administered 2012-06-22: 30 mg via INTRAVENOUS

## 2012-06-22 MED ORDER — NEOSTIGMINE METHYLSULFATE 1 MG/ML IJ SOLN
INTRAMUSCULAR | Status: AC
  Filled 2012-06-22: qty 10

## 2012-06-22 MED ORDER — MEPERIDINE HCL 25 MG/ML IJ SOLN: 6.2500 mg | INTRAMUSCULAR | Status: DC | PRN

## 2012-06-22 MED ORDER — GLYCOPYRROLATE 0.2 MG/ML IJ SOLN
INTRAMUSCULAR | Status: DC | PRN
  Administered 2012-06-22: 0.4 mg via INTRAVENOUS

## 2012-06-22 MED ORDER — INDIGOTINDISULFONATE SODIUM 8 MG/ML IJ SOLN
INTRAMUSCULAR | Status: AC
  Filled 2012-06-22: qty 5

## 2012-06-22 MED ORDER — ROPIVACAINE HCL 5 MG/ML IJ SOLN
INTRAMUSCULAR | Status: AC
  Filled 2012-06-22: qty 60

## 2012-06-22 MED ORDER — HYDROMORPHONE HCL PF 1 MG/ML IJ SOLN
INTRAMUSCULAR | Status: DC | PRN
  Administered 2012-06-22 (×2): 1 mg via INTRAVENOUS

## 2012-06-22 MED ORDER — PROPOFOL 10 MG/ML IV EMUL
INTRAVENOUS | Status: DC | PRN
  Administered 2012-06-22: 200 mg via INTRAVENOUS

## 2012-06-22 MED ORDER — PROMETHAZINE HCL 25 MG/ML IJ SOLN: 6.2500 mg | INTRAMUSCULAR | Status: DC | PRN

## 2012-06-22 MED ORDER — HYDROMORPHONE 0.3 MG/ML IV SOLN
INTRAVENOUS | Status: DC
  Administered 2012-06-22: 1.2 mg via INTRAVENOUS
  Administered 2012-06-22: 21:00:00 via INTRAVENOUS
  Administered 2012-06-23: 0.6 mg via INTRAVENOUS
  Administered 2012-06-23 (×2): 0.9 mg via INTRAVENOUS
  Filled 2012-06-22: qty 25

## 2012-06-22 SURGICAL SUPPLY — 73 items
BAG URINE DRAINAGE (UROLOGICAL SUPPLIES) ×2 IMPLANT
BARRIER ADHS 3X4 INTERCEED (GAUZE/BANDAGES/DRESSINGS) ×2 IMPLANT
BENZOIN TINCTURE PRP APPL 2/3 (GAUZE/BANDAGES/DRESSINGS) ×2 IMPLANT
CABLE HIGH FREQUENCY MONO STRZ (ELECTRODE) ×2 IMPLANT
CATH FOLEY 3WAY  5CC 16FR (CATHETERS) ×1
CATH FOLEY 3WAY 5CC 16FR (CATHETERS) ×1 IMPLANT
CONT PATH 16OZ SNAP LID 3702 (MISCELLANEOUS) ×2 IMPLANT
COVER MAYO STAND STRL (DRAPES) ×2 IMPLANT
COVER TABLE BACK 60X90 (DRAPES) ×4 IMPLANT
COVER TIP SHEARS 8 DVNC (MISCELLANEOUS) ×1 IMPLANT
COVER TIP SHEARS 8MM DA VINCI (MISCELLANEOUS) ×1
DECANTER SPIKE VIAL GLASS SM (MISCELLANEOUS) ×2 IMPLANT
DERMABOND ADVANCED (GAUZE/BANDAGES/DRESSINGS) ×1
DERMABOND ADVANCED .7 DNX12 (GAUZE/BANDAGES/DRESSINGS) ×1 IMPLANT
DRAPE HUG U DISPOSABLE (DRAPE) ×2 IMPLANT
DRAPE LG THREE QUARTER DISP (DRAPES) ×4 IMPLANT
DRAPE MONITOR DA VINCI (DRAPE) IMPLANT
DRAPE WARM FLUID 44X44 (DRAPE) ×2 IMPLANT
ELECT REM PT RETURN 9FT ADLT (ELECTROSURGICAL) ×2
ELECTRODE REM PT RTRN 9FT ADLT (ELECTROSURGICAL) ×1 IMPLANT
EVACUATOR SMOKE 8.L (FILTER) ×2 IMPLANT
GAUZE VASELINE 3X9 (GAUZE/BANDAGES/DRESSINGS) ×2 IMPLANT
GLOVE BIO SURGEON STRL SZ 6.5 (GLOVE) ×6 IMPLANT
GLOVE BIOGEL M 6.5 STRL (GLOVE) ×4 IMPLANT
GLOVE BIOGEL PI IND STRL 6.5 (GLOVE) ×2 IMPLANT
GLOVE BIOGEL PI IND STRL 7.0 (GLOVE) ×4 IMPLANT
GLOVE BIOGEL PI INDICATOR 6.5 (GLOVE) ×2
GLOVE BIOGEL PI INDICATOR 7.0 (GLOVE) ×4
GLOVE ECLIPSE 6.5 STRL STRAW (GLOVE) ×8 IMPLANT
GOWN STRL REIN XL XLG (GOWN DISPOSABLE) ×12 IMPLANT
GYRUS RUMI II 2.5CM BLUE (DISPOSABLE)
GYRUS RUMI II 3.5CM BLUE (DISPOSABLE)
GYRUS RUMI II 4.0CM BLUE (DISPOSABLE)
KIT ACCESSORY DA VINCI DISP (KITS) ×1
KIT ACCESSORY DVNC DISP (KITS) ×1 IMPLANT
KIT DISP ACCESSORY 4 ARM (KITS) IMPLANT
NEEDLE INSUFFLATION 14GA 120MM (NEEDLE) ×2 IMPLANT
OCCLUDER COLPOPNEUMO (BALLOONS) ×2 IMPLANT
PACK LAVH (CUSTOM PROCEDURE TRAY) ×2 IMPLANT
PAD PREP 24X48 CUFFED NSTRL (MISCELLANEOUS) ×4 IMPLANT
PLUG CATH AND CAP STER (CATHETERS) ×2 IMPLANT
PROTECTOR NERVE ULNAR (MISCELLANEOUS) ×4 IMPLANT
RUMI II 3.0CM BLUE KOH-EFFICIE (DISPOSABLE) IMPLANT
RUMI II GYRUS 2.5CM BLUE (DISPOSABLE) IMPLANT
RUMI II GYRUS 3.5CM BLUE (DISPOSABLE) IMPLANT
RUMI II GYRUS 4.0CM BLUE (DISPOSABLE) IMPLANT
SET CYSTO W/LG BORE CLAMP LF (SET/KITS/TRAYS/PACK) ×2 IMPLANT
SET IRRIG TUBING LAPAROSCOPIC (IRRIGATION / IRRIGATOR) ×2 IMPLANT
SOLUTION ELECTROLUBE (MISCELLANEOUS) ×2 IMPLANT
SPONGE LAP 18X18 X RAY DECT (DISPOSABLE) IMPLANT
STRIP CLOSURE SKIN 1/4X4 (GAUZE/BANDAGES/DRESSINGS) ×2 IMPLANT
SUT VIC AB 0 CT1 27 (SUTURE) ×4
SUT VIC AB 0 CT1 27XBRD ANBCTR (SUTURE) ×1 IMPLANT
SUT VIC AB 0 CT1 27XBRD ANTBC (SUTURE) ×3 IMPLANT
SUT VICRYL 0 27 CT2 27 ABS (SUTURE) ×10 IMPLANT
SUT VICRYL 0 UR6 27IN ABS (SUTURE) ×4 IMPLANT
SUT VICRYL RAPIDE 4/0 PS 2 (SUTURE) ×12 IMPLANT
SYR 30ML LL (SYRINGE) ×2 IMPLANT
SYR 50ML LL SCALE MARK (SYRINGE) ×2 IMPLANT
SYSTEM CONVERTIBLE TROCAR (TROCAR) IMPLANT
TIP UTERINE 5.1X6CM LAV DISP (MISCELLANEOUS) IMPLANT
TIP UTERINE 6.7X10CM GRN DISP (MISCELLANEOUS) ×2 IMPLANT
TIP UTERINE 6.7X6CM WHT DISP (MISCELLANEOUS) IMPLANT
TIP UTERINE 6.7X8CM BLUE DISP (MISCELLANEOUS) IMPLANT
TOWEL OR 17X24 6PK STRL BLUE (TOWEL DISPOSABLE) ×6 IMPLANT
TROCAR 12M 150ML BLUNT (TROCAR) ×2 IMPLANT
TROCAR DISP BLADELESS 8 DVNC (TROCAR) ×1 IMPLANT
TROCAR DISP BLADELESS 8MM (TROCAR) ×1
TROCAR XCEL 12X100 BLDLESS (ENDOMECHANICALS) IMPLANT
TROCAR XCEL NON-BLD 11X100MML (ENDOMECHANICALS) ×2 IMPLANT
TUBING FILTER THERMOFLATOR (ELECTROSURGICAL) ×2 IMPLANT
WARMER LAPAROSCOPE (MISCELLANEOUS) ×2 IMPLANT
WATER STERILE IRR 1000ML POUR (IV SOLUTION) ×6 IMPLANT

## 2012-06-22 NOTE — H&P (Signed)
Date of Initial H&P: June 15, 2012  History reviewed, patient examined, no change in status, stable for surgery.

## 2012-06-22 NOTE — Anesthesia Procedure Notes (Signed)
Procedure Name: Intubation Date/Time: 06/22/2012 1:30 PM Performed by: Harriett Rush, Marjorie Lussier A Pre-anesthesia Checklist: Patient identified, Emergency Drugs available, Suction available, Timeout performed and Patient being monitored Patient Re-evaluated:Patient Re-evaluated prior to inductionOxygen Delivery Method: Circle system utilized Preoxygenation: Pre-oxygenation with 100% oxygen Intubation Type: IV induction Ventilation: Mask ventilation without difficulty Laryngoscope Size: Miller and 2 Grade View: Grade I Tube type: Oral Tube size: 7.0 mm Number of attempts: 1

## 2012-06-22 NOTE — Op Note (Signed)
06/22/2012  6:13 PM  PATIENT:  [name]  61 y.o. female  PRE-OPERATIVE DIAGNOSIS:  Pelvic Pain; UterineFibroids  POST-OPERATIVE DIAGNOSIS:  Pelvic Pain; UterineFibroids  PROCEDURE:  Procedure(s) (LRB): ROBOTIC ASSISTED TOTAL HYSTERECTOMY WITH BILATERAL SALPINGO OOPHERECTOMY (Bilateral)  SURGEON:  Surgeon(s) and Role:    * Dorien Chihuahua. Richardson Dopp, MD - Primary    * Geryl Rankins, MD - Assisting  PHYSICIAN ASSISTANT: none  ASSISTANTS: none   ANESTHESIA:   general  EBL: 150  Total I/O In: 2300 [I.V.:2300] Out: 550 [Urine:400; Blood:150]  BLOOD ADMINISTERED:none  DRAINS: Urinary Catheter (Foley)   LOCAL MEDICATIONS USED:  OTHER Rupivocaine   SPECIMEN:  Source of Specimen:  uterus cervix bilateral fallopian tubes and ovaries   DISPOSITION OF SPECIMEN:  PATHOLOGY  COUNTS:  YES were correct   TOURNIQUET:  * No tourniquets in log *  DICTATION: .Dragon Dictation  PLAN OF CARE: Admit for overnight observation  PATIENT DISPOSITION:  PACU - hemodynamically stable.   Delay start of Pharmacological VTE agent (>24hrs) due to surgical blood loss or risk of bleeding: not applicable  Findings: Enlarged fibroid uterus.. Normal fallopian tubes and ovaries..      Procedure: The  patient was taken to the operating room where she was placed under general anesthesia. She was placed in dorsal lithotomy position and prepped and draped in the usual sterile fashion. A weighted speculum was placed into the vagina.  A Deaver was placed anteriorly for retraction. The anterior lip of the cervix was grasped with a single-tooth tenaculum. The vaginal mucosa was injected with 2.5 cc of ropivacaine at the 2/4/ 8 and 10 oclock  positions. The uterus was sounded to 10 cm the cervix was dilated to 6 mm . 0 vicryl sutrure  placed at the 12 and 6:00 positions  Of the cervix to facilitate placement of a Rumi  uterine  manipulator. The manipulator was placed without difficulty. Weighted speculum and Deaver  were removed .  Attention was turned to the patient's abdomen where a  12 mm skin incision was made 4 cm above the umbilicus..  A 12 mm trocar was placed under direct visualization . The pneumoperitoneum  was achieved with CO2 gas.  The laparoscope was removed. 60 cc of ropivacaine were injected into the abdominal cavity. The laparoscope  was reinserted. An  8mm incision was made in the right upper quadrant and an 8 mm   trocar was placed 12 centimeters from the umbilicus.later connected to robotic arm #1). An incision was made in there  left upper quadrant TROCAR WAS PLACED 16 cm from the umbilicus. Later connected to robotic arm #2.  Attention was turned to the right upper quadrant where a 11 mm midclavicular assistant  trocar was placed. ( All incision sites were injected with 10cc of ropivicaine prior to port placement. )  Once all ports had been placed under direct visualization.The laparoscope was removed and the [employer] robotic system was  right-sided docked.  The robotic  arms were connected to the corresponding trocars as listed above. The laparoscope  was then reinserted.  The PK bipolar cautery was placed into port #1. The monopolar scissor placed in the port #2. All instruments were directed into the pelvis under direct visualization.  Attention  was turned to the surgeons console.. The left utero-ovarian ligament was cauterized with PK and excised with scissors. The broad ligament was cauterized with PK incised with scissors. The round ligament was cauterized with the PK incised with scissors. The  anterior leaf of broad ligament was incised along the bladder reflection to the midline.  The right  utero-ovarian ligament was cauterized with PK and excised with scissors. The right broad ligament was cauterized with PK excised scissors. The right round ligament was cauterized with PK and excised with scissors. The   broad ligament was incised  to the midline. The bladder was dissected off the  lower uterine segments of the cervix via sharp and blunt dissection.  The uterine arteries were skeletonized bilaterally. They were cauterized with PK and transected. The KOH ring  was identified.  The anterior colpotomy was performed followed by the posterior colpotomy. Once the uterus and cervix were completely excised They were removed through the vagina    Attention was turned to there right fallopian tube and ovary . The right ureter was identified. The right  Infundibulopelvic ligament was cauterized with the pK and then excised with laparoscopic scissors. Attention was then turned to the left fallopian tube and ovary. There left ureter was identified   The left   Infundibulopelvic ligament was cauterized with the pK and then excised with laparoscopic scissors.The right and left  fallopian tube and ovaries   were removed through  the vagina and sent to pathology.  The pk and scissors were removed and log tip forceps were   placed in the port #1 and the cutting needle driver was placed in to port #2. The vaginal cuff angles were closed with figure-of-eight stitches of 0 Vicryl. The remainder of the vaginal cuff was closed with interrupted 0 Vicryl Vicryl figure-of-eight sutures. The pelvis was irrigated.  Pt was noted to have bleeding from the  Left vaginal cuff angle .Marland Kitchen Hemostasis was obtained with placement of a figure of eight 0 vicryl stitch . Marland Kitchen.Excellent hemostasis was noted. All pelvic pedicles were examined and hemostasis was noted. Interseed was placed along the vaginal cuff. All instuments  removed from the ports.  All ports were removed under direct  Visualization. The  pneumoperitoneum was released.  The fascia of the 12 mm umbilical port was closed with 0 Vicryl and  the fascia the 11 mm port was closed with 0 Vicryl. The skin incisions were closed with 4-0 Vicryl and then covered with Dermabond.   Sponge lap and needle counts were correct x2.  The patient was awakened from anesthesia and  taken to the recovery room in stable condition.

## 2012-06-22 NOTE — Anesthesia Postprocedure Evaluation (Signed)
Anesthesia Post Note  Patient: [name]  Procedure(s) Performed: Procedure(s) (LRB): ROBOTIC ASSISTED TOTAL HYSTERECTOMY WITH BILATERAL SALPINGO OOPHERECTOMY (Bilateral)  Anesthesia type: General  Patient location: PACU  Post pain: Pain level controlled  Post assessment: Post-op Vital signs reviewed  Last Vitals:  Filed Vitals:   06/22/12 1900  BP: 144/89  Pulse: 67  Temp: 37.1 C  Resp: 12    Post vital signs: Reviewed  Level of consciousness: sedated  Complications: No apparent anesthesia complicationsfj

## 2012-06-22 NOTE — Transfer of Care (Signed)
Immediate Anesthesia Transfer of Care Note  Patient: [name]  Procedure(s) Performed: Procedure(s) (LRB): ROBOTIC ASSISTED TOTAL HYSTERECTOMY WITH BILATERAL SALPINGO OOPHERECTOMY (Bilateral)  Patient Location: PACU  Anesthesia Type: General  Level of Consciousness: alert , oriented and sedated  Airway & Oxygen Therapy: Patient Spontanous Breathing and Patient connected to nasal cannula oxygen  Post-op Assessment: Report given to PACU RN and Post -op Vital signs reviewed and stable  Post vital signs: Reviewed and stable  Complications: No apparent anesthesia complications

## 2012-06-23 LAB — CBC
HCT: 28.3 % — ABNORMAL LOW (ref 36.0–46.0)
MCHC: 32.9 g/dL (ref 30.0–36.0)
MCV: 85.8 fL (ref 78.0–100.0)
RDW: 15.2 % (ref 11.5–15.5)

## 2012-06-23 MED ORDER — ACETAMINOPHEN 325 MG PO TABS
650.0000 mg | ORAL_TABLET | Freq: Four times a day (QID) | ORAL | Status: DC | PRN
  Administered 2012-06-23: 650 mg via ORAL
  Filled 2012-06-23: qty 2

## 2012-06-23 MED ORDER — ACETAMINOPHEN 325 MG PO TABS
650.0000 mg | ORAL_TABLET | ORAL | Status: DC | PRN
  Administered 2012-06-23: 650 mg via ORAL
  Filled 2012-06-23: qty 2

## 2012-06-23 NOTE — Progress Notes (Signed)
Subjective: Patient reports tolerating PO and no problems voiding.   Pain is currently controlled. Pt has ambulated   Objective: I have reviewed patient's vital signs, intake and output, medications and labs.  General: alert and cooperative GI: incision: clean, dry and intact Extremities: extremities normal, atraumatic, no cyanosis or edema ABD soft appropriately tender nondistended + BS in all 4 quadrants   Assessment/Plan: POD #1 s.p robotic assisted Laparoscopic total hysterectomy/ BSO D/C pca start home pain medications.  Pt is on oxycodone for chronic pain will restart patients normal dose.  D/C home once pt reports flatus   LOS: 1 day    Caterine Mcmeans J. 06/23/2012, 7:58 AM

## 2012-06-23 NOTE — Anesthesia Postprocedure Evaluation (Signed)
  Anesthesia Post-op Note  Patient: [name]  Procedure(s) Performed: Procedure(s) (LRB): ROBOTIC ASSISTED TOTAL HYSTERECTOMY WITH BILATERAL SALPINGO OOPHERECTOMY (Bilateral)  Patient Location: 309  Anesthesia Type: General  Level of Consciousness: awake, alert  and oriented  Airway and Oxygen Therapy: Patient Spontanous Breathing  Post-op Pain: mild  Post-op Assessment: Post-op Vital signs reviewed and Patient's Cardiovascular Status Stable  Post-op Vital Signs: Reviewed and stable  Complications: No apparent anesthesia complications

## 2012-06-23 NOTE — Addendum Note (Signed)
Addendum  created 06/23/12 1108 by Graciela Husbands, CRNA   Modules edited:Notes Section

## 2012-06-23 NOTE — Discharge Summary (Signed)
Physician Discharge Summary  Patient ID: [name] MRN: [id number] DOB/AGE: 60-Jul-1952 60 y.o.  Admit date: 06/22/2012  Pt in for observation after robotic assisted total hysterectomy bso on 06/22/2012.  Discharge date: 06/23/2012  Admission Diagnoses: 1 / Uterine fibroids 2. Pelvic Pain. 3 Diabetes 4 hypertension 5 fibromyalgia   Discharge Diagnoses: Same plus s/p robotic assisted total hysterectomy / BSO  Active Problems:  * No active hospital problems. *    Discharged Condition: good  Hospital Course:  Pt in for observation after robotic assisted total hysterectomy bso on 06/22/2012. She did well postoperatively with return of bowel and bladder fuction. hgb 9.3 on POD #1 .  Consults: None  Significant Diagnostic Studies: labs: hgb 9,3  Treatments: surgery: robotic assisted total hysterectomy and bilateral salpingoophorectomy   Discharge Exam: Blood pressure 119/77, pulse 80, temperature 98.6 F (37 C), temperature source Oral, resp. rate 18, height 5' 9.5" (1.765 m), weight 117.935 kg (260 lb), SpO2 96.00%. General appearance: alert and cooperative ABD soft appropriately tender +BS  Incision healing well no evidence of erythema   Disposition: 01-Home or Self Care  Discharge Orders    Future Appointments: Provider: Department: Dept Phone: Center:   08/04/2012 11:30 AM [facility] [phone]     Future Orders Please Complete By Expires   Diet Carb Modified      Increase activity slowly      Driving Restrictions      Comments:   Avoid driving for 1 wk   Lifting restrictions      Comments:   Avoid lifting over 10 lbs   Sexual Activity Restrictions      Comments:   Avoid sex for 8 wks   Call MD for:  temperature >100.4      Call MD for:  persistant nausea and vomiting      Call MD for:  severe uncontrolled pain        Medication List  As of 06/23/2012 12:24 PM   TAKE these medications         acetaminophen 650 MG CR tablet   Commonly known as: TYLENOL   Take 650 mg by mouth every 8 (eight) hours as needed. pain      albuterol 108 (90 BASE) MCG/ACT inhaler   Commonly known as: PROVENTIL HFA;VENTOLIN HFA   Inhale 2 puffs into the lungs every 6 (six) hours as needed. Shortness of breath      ATENOLOL PO   Take 75 mg by mouth every morning.      beclomethasone 40 MCG/ACT inhaler   Commonly known as: QVAR   Inhale 2 puffs into the lungs 2 (two) times daily.      cholecalciferol 1000 UNITS tablet   Commonly known as: VITAMIN D   Take 2,000 Units by mouth daily.      esomeprazole 40 MG capsule   Commonly known as: NEXIUM   Take 40 mg by mouth daily before breakfast.      fluticasone 50 MCG/ACT nasal spray   Commonly known as: FLONASE   Place 2 sprays into the nose as needed. congestion      gabapentin 600 MG tablet   Commonly known as: NEURONTIN   Take 600 mg by mouth 3 (three) times daily.      glipiZIDE 10 MG tablet   Commonly known as: GLUCOTROL   Take 10 mg by mouth 2 (two) times daily before a meal.      ibuprofen 800 MG tablet   Commonly  known as: ADVIL,MOTRIN   Take 800 mg by mouth every 8 (eight) hours as needed. pain      Iron 240 (27 FE) MG Tabs   Take 1 tablet by mouth daily.      metFORMIN 850 MG tablet   Commonly known as: GLUCOPHAGE   Take 850 mg by mouth 2 (two) times daily with a meal.      methocarbamol 750 MG tablet   Commonly known as: ROBAXIN   Take 750 mg by mouth 3 (three) times daily as needed.      montelukast 10 MG tablet   Commonly known as: SINGULAIR   Take 10 mg by mouth every morning.      oxycodone 30 MG immediate release tablet   Commonly known as: ROXICODONE   Take 30 mg by mouth 4 (four) times daily.      simvastatin 40 MG tablet   Commonly known as: ZOCOR   Take 40 mg by mouth every evening.      triamterene-hydrochlorothiazide 37.5-25 MG per capsule   Commonly known as: DYAZIDE   Take 1 capsule by mouth every morning.           Follow-up Information    Follow up with  Lezlee Gills J., MD. Call in 2 weeks. (postoperative visit )    Contact information:   [address] [place] [address] [phone]          Signed: Jessee Avers. 06/23/2012, 12:24 PM

## 2012-08-04 ENCOUNTER — Ambulatory Visit (HOSPITAL_COMMUNITY)
Admission: RE | Admit: 2012-08-04 | Discharge: 2012-08-04 | Disposition: A | Discharge status: Home / Self Care | Payer: Medicare Other | Source: Ambulatory Visit | Attending: Internal Medicine | Admitting: Internal Medicine

## 2012-08-04 DIAGNOSIS — Z1231 Encounter for screening mammogram for malignant neoplasm of breast: Secondary | ICD-10-CM | POA: Insufficient documentation

## 2012-08-07 ENCOUNTER — Other Ambulatory Visit: Payer: Self-pay | Admitting: Internal Medicine

## 2012-08-07 DIAGNOSIS — R928 Other abnormal and inconclusive findings on diagnostic imaging of breast: Secondary | ICD-10-CM

## 2012-08-14 ENCOUNTER — Ambulatory Visit
Admission: RE | Admit: 2012-08-14 | Discharge: 2012-08-14 | Disposition: A | Discharge status: Home / Self Care | Payer: Medicare Other | Source: Ambulatory Visit | Attending: Internal Medicine | Admitting: Internal Medicine

## 2012-08-14 DIAGNOSIS — R928 Other abnormal and inconclusive findings on diagnostic imaging of breast: Secondary | ICD-10-CM

## 2013-01-10 ENCOUNTER — Emergency Department (HOSPITAL_COMMUNITY): Payer: Medicare Other

## 2013-01-10 ENCOUNTER — Encounter (HOSPITAL_COMMUNITY): Payer: Self-pay | Admitting: Nurse Practitioner

## 2013-01-10 ENCOUNTER — Emergency Department (HOSPITAL_COMMUNITY)
Admission: EM | Admit: 2013-01-10 | Discharge: 2013-01-10 | Disposition: A | Discharge status: Home / Self Care | Payer: Medicare Other | Attending: Emergency Medicine | Admitting: Emergency Medicine

## 2013-01-10 DIAGNOSIS — Z8669 Personal history of other diseases of the nervous system and sense organs: Secondary | ICD-10-CM | POA: Insufficient documentation

## 2013-01-10 DIAGNOSIS — Z8679 Personal history of other diseases of the circulatory system: Secondary | ICD-10-CM | POA: Insufficient documentation

## 2013-01-10 DIAGNOSIS — Z8742 Personal history of other diseases of the female genital tract: Secondary | ICD-10-CM | POA: Insufficient documentation

## 2013-01-10 DIAGNOSIS — E785 Hyperlipidemia, unspecified: Secondary | ICD-10-CM | POA: Insufficient documentation

## 2013-01-10 DIAGNOSIS — J449 Chronic obstructive pulmonary disease, unspecified: Secondary | ICD-10-CM | POA: Insufficient documentation

## 2013-01-10 DIAGNOSIS — K219 Gastro-esophageal reflux disease without esophagitis: Secondary | ICD-10-CM | POA: Insufficient documentation

## 2013-01-10 DIAGNOSIS — Z8739 Personal history of other diseases of the musculoskeletal system and connective tissue: Secondary | ICD-10-CM | POA: Insufficient documentation

## 2013-01-10 DIAGNOSIS — D509 Iron deficiency anemia, unspecified: Secondary | ICD-10-CM | POA: Insufficient documentation

## 2013-01-10 DIAGNOSIS — F329 Major depressive disorder, single episode, unspecified: Secondary | ICD-10-CM | POA: Insufficient documentation

## 2013-01-10 DIAGNOSIS — Z8709 Personal history of other diseases of the respiratory system: Secondary | ICD-10-CM | POA: Insufficient documentation

## 2013-01-10 DIAGNOSIS — Z87448 Personal history of other diseases of urinary system: Secondary | ICD-10-CM | POA: Insufficient documentation

## 2013-01-10 DIAGNOSIS — G510 Bell's palsy: Secondary | ICD-10-CM | POA: Insufficient documentation

## 2013-01-10 DIAGNOSIS — I10 Essential (primary) hypertension: Secondary | ICD-10-CM | POA: Insufficient documentation

## 2013-01-10 DIAGNOSIS — N189 Chronic kidney disease, unspecified: Secondary | ICD-10-CM | POA: Insufficient documentation

## 2013-01-10 DIAGNOSIS — J4489 Other specified chronic obstructive pulmonary disease: Secondary | ICD-10-CM | POA: Insufficient documentation

## 2013-01-10 DIAGNOSIS — E119 Type 2 diabetes mellitus without complications: Secondary | ICD-10-CM | POA: Insufficient documentation

## 2013-01-10 DIAGNOSIS — F3289 Other specified depressive episodes: Secondary | ICD-10-CM | POA: Insufficient documentation

## 2013-01-10 DIAGNOSIS — Z8719 Personal history of other diseases of the digestive system: Secondary | ICD-10-CM | POA: Insufficient documentation

## 2013-01-10 DIAGNOSIS — Z87891 Personal history of nicotine dependence: Secondary | ICD-10-CM | POA: Insufficient documentation

## 2013-01-10 DIAGNOSIS — Z8611 Personal history of tuberculosis: Secondary | ICD-10-CM | POA: Insufficient documentation

## 2013-01-10 DIAGNOSIS — G8929 Other chronic pain: Secondary | ICD-10-CM | POA: Insufficient documentation

## 2013-01-10 DIAGNOSIS — Z79899 Other long term (current) drug therapy: Secondary | ICD-10-CM | POA: Insufficient documentation

## 2013-01-10 HISTORY — DX: Type 2 diabetes mellitus without complications: E11.9

## 2013-01-10 LAB — CBC WITH DIFFERENTIAL/PLATELET
Basophils Absolute: 0 10*3/uL (ref 0.0–0.1)
Basophils Relative: 1 % (ref 0–1)
Eosinophils Absolute: 0.1 10*3/uL (ref 0.0–0.7)
Eosinophils Relative: 2 % (ref 0–5)
HCT: 34.5 % — ABNORMAL LOW (ref 36.0–46.0)
MCH: 28.1 pg (ref 26.0–34.0)
MCHC: 33 g/dL (ref 30.0–36.0)
MCV: 85 fL (ref 78.0–100.0)
Monocytes Absolute: 0.4 10*3/uL (ref 0.1–1.0)
Platelets: 230 10*3/uL (ref 150–400)
RDW: 15.1 % (ref 11.5–15.5)

## 2013-01-10 LAB — COMPREHENSIVE METABOLIC PANEL
AST: 25 U/L (ref 0–37)
Albumin: 4.1 g/dL (ref 3.5–5.2)
CO2: 27 mEq/L (ref 19–32)
Calcium: 9.9 mg/dL (ref 8.4–10.5)
Creatinine, Ser: 0.58 mg/dL (ref 0.50–1.10)
GFR calc non Af Amer: >90 mL/min (ref >90)
Total Protein: 7.9 g/dL (ref 6.0–8.3)

## 2013-01-10 LAB — GLUCOSE, CAPILLARY: Glucose-Capillary: 139 mg/dL — ABNORMAL HIGH (ref 70–99)

## 2013-01-10 LAB — URINALYSIS, ROUTINE W REFLEX MICROSCOPIC
Hgb urine dipstick: NEGATIVE
Specific Gravity, Urine: 1.016 (ref 1.005–1.030)
Urobilinogen, UA: 0.2 mg/dL (ref 0.0–1.0)
pH: 7.5 (ref 5.0–8.0)

## 2013-01-10 LAB — TROPONIN I: Troponin I: <0.3 ng/mL (ref <0.30)

## 2013-01-10 MED ORDER — LACRI-LUBE S.O.P. OP OINT: 1.0000 [in_us] | TOPICAL_OINTMENT | Freq: Four times a day (QID) | OPHTHALMIC | Status: DC | PRN

## 2013-01-10 MED ORDER — PREDNISONE 20 MG PO TABS: ORAL_TABLET | ORAL | Status: DC

## 2013-01-10 MED ORDER — ACYCLOVIR 800 MG PO TABS: 800.0000 mg | ORAL_TABLET | Freq: Every day | ORAL | Status: DC

## 2013-01-10 MED ORDER — KETOROLAC TROMETHAMINE 30 MG/ML IJ SOLN
30.0000 mg | Freq: Once | INTRAMUSCULAR | Status: AC
  Administered 2013-01-10: 30 mg via INTRAVENOUS
  Filled 2013-01-10: qty 1

## 2013-01-10 NOTE — ED Notes (Signed)
Pt to MRI

## 2013-01-10 NOTE — ED Notes (Signed)
Pt returned to room 9 from CT scan.

## 2013-01-10 NOTE — ED Notes (Signed)
Pt reports yesterday she was eating a candy bar and states every time she tried to chew the food "would go to the wrong side, and my R eye felt like it had something in it, and my head hurt." pt reports when she was putting on her makeup this am her face "looked droopy" and her eye was watering. Pt speech is slurred. Pt reports spasms in her R hand since yesterday. Reports history of bells palsy.

## 2013-01-10 NOTE — ED Notes (Signed)
Pt returned from Radiology.  No changes at the present.  No distress noted.

## 2013-01-10 NOTE — ED Provider Notes (Signed)
History     CSN: [id number]  Arrival date & time 01/10/13  1301   First MD Initiated Contact with Patient 01/10/13 1312      Chief Complaint  Patient presents with  . Stroke Symptoms    (Consider location/radiation/quality/duration/timing/severity/associated sxs/prior treatment) The history is provided by the patient.   62 year old female is concerned she may have had a stroke. She looked in mirror this morning and the right side of her face was drooping. She's complaining of some aching in her lateral rib cage and arms and is not sure she is having any weakness. She's not having any difficulty talking. Has a history of Bell's palsy in is concerned this is a recurrence of her Bell's palsy. She cannot remember which side her prior Bell's palsy was located. She denies headache. There's no nausea or vomiting. She's not having any difficulty walking. She is somewhat evasive about whether she is having any difficulty using her hands and arms. Symptoms are moderate and stable. Nothing makes it better nothing makes it worse.  Past Medical History  Diagnosis Date  . Hypertension   . GERD (gastroesophageal reflux disease)   . Depression   . History of cardiac arrhythmia STATES NO ISSUES  FOR A LONG TIME    ATENOLOL CONTROLS PALPITATIONS  . Non-ischemic cardiomyopathy ECHO IN 2006 W/ CHART    CARDIAC CATH 2009  W/ CHART  . History of echocardiogram 05-24-2005   NORMAL LVSF ,  EF  50-60%  . Sarcoidosis   . Positive TB test   . Chronic bronchitis   . History of Bell's palsy 2002--  LEFT SIDE-- NO RESIDUAL   . Pelvic pain in female   . Frequency of urination   . Urgency of urination   . Stress incontinence, female   . Nocturia   . Chronic pain syndrome   . History of glaucoma STATES TREATED FOR ABOUT 5 YRS  AGO  --- NO ISSUES SINCE  . Uterine fibroid   . Ankle edema BILATERAL  . Hyperlipidemia   . COPD (chronic obstructive pulmonary disease) CHRONIC BRONCHITIS --  LAST BOUT  BILATERAL  PNEUMONIA 2009  . Iron deficiency anemia   . H/O hiatal hernia   . Fibromyalgia   . Arthritis     BACK, KNEE, HIPS  . Chronic renal insufficiency   . Diabetes mellitus without complication     Past Surgical History  Procedure Date  . Cardiac catheterization 09-09-2008    NORMAL CORONARY ARTERIES/ NORMAL LVSF/ CHRONIC RENAL INSUFF.  . Bilateral benign breast tumor removed YRS AGO  . Tubal ligation YRS AGO  . Cysto with hydrodistension 01/07/2012    Procedure: CYSTOSCOPY/HYDRODISTENSION;  Surgeon: Martina Sinner, MD;  Location: [facility];  Service: Urology;  Laterality: N/A;   OF BLADDER WITH INSTILLATION OF MARCAINE AND PRYDIUM  . Neck cyst     History reviewed. No pertinent family history.  History  Substance Use Topics  . Smoking status: Former Smoker -- 1.0 packs/day for 27 years    Types: Cigarettes    Quit date: 12/06/1991  . Smokeless tobacco: Never Used  . Alcohol Use: No    OB History    Grav Para Term Preterm Abortions TAB SAB Ect Mult Living                  Review of Systems  All other systems reviewed and are negative.    Allergies  Avapro; Lisinopril; and Latex  Home Medications   Current  Outpatient Rx  Name  Route  Sig  Dispense  Refill  . ACETAMINOPHEN ER 650 MG PO TBCR   Oral   Take 650 mg by mouth every 8 (eight) hours as needed. pain         . ALBUTEROL SULFATE HFA 108 (90 BASE) MCG/ACT IN AERS   Inhalation   Inhale 2 puffs into the lungs every 6 (six) hours as needed. Shortness of breath         . ATENOLOL PO   Oral   Take 75 mg by mouth every morning.         . BECLOMETHASONE DIPROPIONATE 40 MCG/ACT IN AERS   Inhalation   Inhale 2 puffs into the lungs 2 (two) times daily.         Marland Kitchen VITAMIN D 1000 UNITS PO TABS   Oral   Take 2,000 Units by mouth daily.         Marland Kitchen ESOMEPRAZOLE MAGNESIUM 40 MG PO CPDR   Oral   Take 40 mg by mouth daily before breakfast.         . IRON 240 (27 FE) MG PO TABS    Oral   Take 1 tablet by mouth daily.         Marland Kitchen FLUTICASONE PROPIONATE 50 MCG/ACT NA SUSP   Nasal   Place 2 sprays into the nose as needed. congestion         . GABAPENTIN 600 MG PO TABS   Oral   Take 600 mg by mouth 3 (three) times daily.         Marland Kitchen GLIPIZIDE 10 MG PO TABS   Oral   Take 10 mg by mouth 2 (two) times daily before a meal.         . IBUPROFEN 800 MG PO TABS   Oral   Take 800 mg by mouth every 8 (eight) hours as needed. pain         . METFORMIN HCL 850 MG PO TABS   Oral   Take 850 mg by mouth 2 (two) times daily with a meal.         . METHOCARBAMOL 750 MG PO TABS   Oral   Take 750 mg by mouth 3 (three) times daily as needed.         Marland Kitchen MONTELUKAST SODIUM 10 MG PO TABS   Oral   Take 10 mg by mouth every morning.         . OXYCODONE HCL 30 MG PO TABS   Oral   Take 30 mg by mouth 4 (four) times daily.         Marland Kitchen SIMVASTATIN 40 MG PO TABS   Oral   Take 40 mg by mouth every evening.         . TRIAMTERENE-HCTZ 37.5-25 MG PO CAPS   Oral   Take 1 capsule by mouth every morning.           BP 134/100  Pulse 66  Temp 98.7 F (37.1 C) (Oral)  Resp 16  SpO2 97%  Physical Exam  Nursing note and vitals reviewed.  62 year old female, resting comfortably and in no acute distress. Vital signs are significant for diastolic hypertension with blood pressure 134/100. Oxygen saturation is 97%, which is normal. Head is normocephalic and atraumatic. PERRLA, EOMI. Oropharynx is clear. Fundi show no hemorrhage, exudate, or papilledema. Neck is nontender and supple without adenopathy or JVD. There no carotid bruits. Back is nontender and  there is no CVA tenderness. Lungs are clear without rales, wheezes, or rhonchi. Chest is nontender. Heart has regular rate and rhythm without murmur. Abdomen is soft, flat, nontender without masses or hepatosplenomegaly and peristalsis is normoactive. Extremities have no cyanosis or edema, full range of motion is  present. Skin is warm and dry without rash. Neurologic: Mental status is normal, there is a moderate right central facial droop, right hand squeezing is slightly weaker than left but strength on both flexion and extension it is symmetric. There is a questionable left-sided pronator drift. There is slight weakness of right hip flexion compared with left hip flexion. No sensory deficits are seen.  ED Course  Procedures (including critical care time)  Results for orders placed during the hospital encounter of 01/10/13  CBC WITH DIFFERENTIAL      Component Value Range   WBC 5.8  4.0 - 10.5 K/uL   RBC 4.06  3.87 - 5.11 MIL/uL   Hemoglobin 11.4 (*) 12.0 - 15.0 g/dL   HCT 16.1 (*) 09.6 - 04.5 %   MCV 85.0  78.0 - 100.0 fL   MCH 28.1  26.0 - 34.0 pg   MCHC 33.0  30.0 - 36.0 g/dL   RDW 40.9  81.1 - 91.4 %   Platelets 230  150 - 400 K/uL   Neutrophils Relative 58  43 - 77 %   Neutro Abs 3.4  1.7 - 7.7 K/uL   Lymphocytes Relative 33  12 - 46 %   Lymphs Abs 1.9  0.7 - 4.0 K/uL   Monocytes Relative 6  3 - 12 %   Monocytes Absolute 0.4  0.1 - 1.0 K/uL   Eosinophils Relative 2  0 - 5 %   Eosinophils Absolute 0.1  0.0 - 0.7 K/uL   Basophils Relative 1  0 - 1 %   Basophils Absolute 0.0  0.0 - 0.1 K/uL  COMPREHENSIVE METABOLIC PANEL      Component Value Range   Sodium 137  135 - 145 mEq/L   Potassium 3.8  3.5 - 5.1 mEq/L   Chloride 97  96 - 112 mEq/L   CO2 27  19 - 32 mEq/L   Glucose, Bld 138 (*) 70 - 99 mg/dL   BUN 11  6 - 23 mg/dL   Creatinine, Ser 7.82  0.50 - 1.10 mg/dL   Calcium 9.9  8.4 - 95.6 mg/dL   Total Protein 7.9  6.0 - 8.3 g/dL   Albumin 4.1  3.5 - 5.2 g/dL   AST 25  0 - 37 U/L   ALT 19  0 - 35 U/L   Alkaline Phosphatase 74  39 - 117 U/L   Total Bilirubin 0.3  0.3 - 1.2 mg/dL   GFR calc non Af Amer >90  >90 mL/min   GFR calc Af Amer >90  >90 mL/min  URINALYSIS, ROUTINE W REFLEX MICROSCOPIC      Component Value Range   Color, Urine YELLOW  YELLOW   APPearance CLEAR  CLEAR     Specific Gravity, Urine 1.016  1.005 - 1.030   pH 7.5  5.0 - 8.0   Glucose, UA NEGATIVE  NEGATIVE mg/dL   Hgb urine dipstick NEGATIVE  NEGATIVE   Bilirubin Urine NEGATIVE  NEGATIVE   Ketones, ur NEGATIVE  NEGATIVE mg/dL   Protein, ur NEGATIVE  NEGATIVE mg/dL   Urobilinogen, UA 0.2  0.0 - 1.0 mg/dL   Nitrite NEGATIVE  NEGATIVE   Leukocytes, UA NEGATIVE  NEGATIVE  TROPONIN I      Component Value Range   Troponin I <0.30  <0.30 ng/mL  GLUCOSE, CAPILLARY      Component Value Range   Glucose-Capillary 139 (*) 70 - 99 mg/dL   Dg Chest 2 View  4/40/1027  *RADIOLOGY REPORT*  Clinical Data: Stroke  CHEST - 2 VIEW  Comparison: 07/26/2011  Findings: Heart size is mildly enlarged.  Negative for heart failure.  Negative for infiltrate or effusion.  Lungs are clear.  IMPRESSION: Prominent heart size.  No acute abnormality.   Original Report Authenticated By: Janeece Riggers, M.D.    Ct Head Wo Contrast  01/10/2013  *RADIOLOGY REPORT*  Clinical Data: Perceived facial droop.  Slurred speech.  Diabetes mellitus.  CT HEAD WITHOUT CONTRAST  Technique:  Contiguous axial images were obtained from the base of the skull through the vertex without contrast.  Comparison: 10/06/2008  Findings: There is no evidence for acute infarction, intracranial hemorrhage, mass lesion, hydrocephalus, or extra-axial fluid.  Mild atrophy.  Chronic microvascular ischemic change.  No CT signs of proximal vascular thrombosis.  Carotid atherosclerosis.  Intact calvarium.  No acute sinus, mastoid, or orbital pathology.  IMPRESSION: Mild atrophy and chronic microvascular ischemic change appears similar to 2009.  No visible acute intracranial abnormality.   Original Report Authenticated By: Davonna Belling, M.D.     Date: 01/10/2013  Rate: 61  Rhythm: normal sinus rhythm  QRS Axis: normal  Intervals: normal  ST/T Wave abnormalities: nonspecific T wave changes  Conduction Disutrbances:none  Narrative Interpretation: Nonspecific T wave  changes. When compared with ECG of 06/16/2012, no significant changes are seen.  Old EKG Reviewed: unchanged    1. Bell's palsy       MDM  Probable Bell's palsy. Remainder of findings are inconsistent and confusing and I doubt actual stroke. She was sent for CT scan and, if negative, sent for MRI scan. She does not qualify for thrombolytic therapy and is not a code stroke because last known normal time was sometime last night which would've been approximately 16 hours ago.  CT is negative an MRI is pending. She continues to complain of pain shooting from her right lateral chest wall right down the right side of her body. No indication of any significant pathology at this point. She'll be given a therapeutic trial of ketorolac. Case is signed out to Dr. Ignacia Palma to evaluate MRI results.      Dione Booze, MD 01/12/13 [phone]

## 2013-01-10 NOTE — ED Notes (Signed)
Pt states that she is still having R sided facial droop.  Droop noted to R side of face.  Speech is clear and coherent.  No distress noted.  No R sided weakness noted.  Pt transported to CT for scan of head.

## 2013-05-30 ENCOUNTER — Encounter (HOSPITAL_COMMUNITY): Payer: Self-pay | Admitting: *Deleted

## 2013-05-30 ENCOUNTER — Emergency Department (HOSPITAL_COMMUNITY): Payer: Medicare Other

## 2013-05-30 ENCOUNTER — Emergency Department (HOSPITAL_COMMUNITY)
Admission: EM | Admit: 2013-05-30 | Discharge: 2013-05-30 | Disposition: A | Discharge status: Home / Self Care | Payer: Medicare Other | Attending: Emergency Medicine | Admitting: Emergency Medicine

## 2013-05-30 DIAGNOSIS — IMO0001 Reserved for inherently not codable concepts without codable children: Secondary | ICD-10-CM | POA: Insufficient documentation

## 2013-05-30 DIAGNOSIS — Z8742 Personal history of other diseases of the female genital tract: Secondary | ICD-10-CM | POA: Insufficient documentation

## 2013-05-30 DIAGNOSIS — J42 Unspecified chronic bronchitis: Secondary | ICD-10-CM | POA: Insufficient documentation

## 2013-05-30 DIAGNOSIS — Z91040 Latex allergy status: Secondary | ICD-10-CM | POA: Insufficient documentation

## 2013-05-30 DIAGNOSIS — C482 Malignant neoplasm of peritoneum, unspecified: Secondary | ICD-10-CM | POA: Insufficient documentation

## 2013-05-30 DIAGNOSIS — F3289 Other specified depressive episodes: Secondary | ICD-10-CM | POA: Insufficient documentation

## 2013-05-30 DIAGNOSIS — M129 Arthropathy, unspecified: Secondary | ICD-10-CM | POA: Insufficient documentation

## 2013-05-30 DIAGNOSIS — J019 Acute sinusitis, unspecified: Secondary | ICD-10-CM | POA: Insufficient documentation

## 2013-05-30 DIAGNOSIS — I428 Other cardiomyopathies: Secondary | ICD-10-CM | POA: Insufficient documentation

## 2013-05-30 DIAGNOSIS — I129 Hypertensive chronic kidney disease with stage 1 through stage 4 chronic kidney disease, or unspecified chronic kidney disease: Secondary | ICD-10-CM | POA: Insufficient documentation

## 2013-05-30 DIAGNOSIS — Z8669 Personal history of other diseases of the nervous system and sense organs: Secondary | ICD-10-CM | POA: Insufficient documentation

## 2013-05-30 DIAGNOSIS — E119 Type 2 diabetes mellitus without complications: Secondary | ICD-10-CM | POA: Insufficient documentation

## 2013-05-30 DIAGNOSIS — E785 Hyperlipidemia, unspecified: Secondary | ICD-10-CM | POA: Insufficient documentation

## 2013-05-30 DIAGNOSIS — F329 Major depressive disorder, single episode, unspecified: Secondary | ICD-10-CM | POA: Insufficient documentation

## 2013-05-30 DIAGNOSIS — N189 Chronic kidney disease, unspecified: Secondary | ICD-10-CM | POA: Insufficient documentation

## 2013-05-30 DIAGNOSIS — Z8679 Personal history of other diseases of the circulatory system: Secondary | ICD-10-CM | POA: Insufficient documentation

## 2013-05-30 DIAGNOSIS — Z888 Allergy status to other drugs, medicaments and biological substances status: Secondary | ICD-10-CM | POA: Insufficient documentation

## 2013-05-30 DIAGNOSIS — D509 Iron deficiency anemia, unspecified: Secondary | ICD-10-CM | POA: Insufficient documentation

## 2013-05-30 DIAGNOSIS — K219 Gastro-esophageal reflux disease without esophagitis: Secondary | ICD-10-CM | POA: Insufficient documentation

## 2013-05-30 DIAGNOSIS — R05 Cough: Secondary | ICD-10-CM | POA: Insufficient documentation

## 2013-05-30 DIAGNOSIS — R059 Cough, unspecified: Secondary | ICD-10-CM | POA: Insufficient documentation

## 2013-05-30 DIAGNOSIS — G894 Chronic pain syndrome: Secondary | ICD-10-CM | POA: Insufficient documentation

## 2013-05-30 DIAGNOSIS — R519 Headache, unspecified: Secondary | ICD-10-CM

## 2013-05-30 DIAGNOSIS — Z79899 Other long term (current) drug therapy: Secondary | ICD-10-CM | POA: Insufficient documentation

## 2013-05-30 DIAGNOSIS — J329 Chronic sinusitis, unspecified: Secondary | ICD-10-CM

## 2013-05-30 DIAGNOSIS — Z87891 Personal history of nicotine dependence: Secondary | ICD-10-CM | POA: Insufficient documentation

## 2013-05-30 MED ORDER — TRAMADOL HCL 50 MG PO TABS
50.0000 mg | ORAL_TABLET | Freq: Once | ORAL | Status: AC
  Administered 2013-05-30: 50 mg via ORAL
  Filled 2013-05-30: qty 1

## 2013-05-30 MED ORDER — METOCLOPRAMIDE HCL 5 MG/ML IJ SOLN
10.0000 mg | Freq: Once | INTRAMUSCULAR | Status: AC
  Administered 2013-05-30: 10 mg via INTRAVENOUS
  Filled 2013-05-30: qty 2

## 2013-05-30 MED ORDER — DIPHENHYDRAMINE HCL 50 MG/ML IJ SOLN
25.0000 mg | Freq: Once | INTRAMUSCULAR | Status: AC
  Administered 2013-05-30: 25 mg via INTRAVENOUS
  Filled 2013-05-30: qty 1

## 2013-05-30 MED ORDER — KETOROLAC TROMETHAMINE 30 MG/ML IJ SOLN
15.0000 mg | Freq: Once | INTRAMUSCULAR | Status: AC
  Administered 2013-05-30: 15 mg via INTRAVENOUS
  Filled 2013-05-30: qty 1

## 2013-05-30 MED ORDER — SODIUM CHLORIDE 0.9 % IV BOLUS (SEPSIS)
1000.0000 mL | Freq: Once | INTRAVENOUS | Status: AC
  Administered 2013-05-30: 1000 mL via INTRAVENOUS

## 2013-05-30 MED ORDER — AMOXICILLIN 500 MG PO CAPS: 1000.0000 mg | ORAL_CAPSULE | Freq: Two times a day (BID) | ORAL | Status: DC

## 2013-05-30 MED ORDER — TRAMADOL HCL 50 MG PO TABS: 50.0000 mg | ORAL_TABLET | Freq: Once | ORAL | Status: DC

## 2013-05-30 NOTE — ED Notes (Signed)
MD at bedside. 

## 2013-05-30 NOTE — ED Notes (Addendum)
Reports having head congestion x 2 weeks, now having headache, productive cough, and bodyaches. Airway intact, no acute distress noted at triage.

## 2013-05-30 NOTE — ED Provider Notes (Signed)
History    62 year old female with a chief complaint of headache. Gradual onset several days ago. Patient cannot remember which was done at the onset of her headache. Pain is in the frontal region in her face. Relatively constant. No appreciable exacerbating relieving factors. No fevers or chills. No neck pain next at this. No visual complaints. No acute numbness, tingling or loss of strength. No significant headache history. Preceding this by about 2 weeks patient has been feeling "congested." Occasional nonproductive cough. There is no chest pain or shortness of breath. No unusual leg pain or swelling. Has been using using zyrtec, flonase, albuterol w/o much relief.   CSN: [id number]  Arrival date & time 05/30/13  1044   First MD Initiated Contact with Patient 05/30/13 1110      Chief Complaint  Patient presents with  . Headache  . URI    (Consider location/radiation/quality/duration/timing/severity/associated sxs/prior treatment) HPI  Past Medical History  Diagnosis Date  . Hypertension   . GERD (gastroesophageal reflux disease)   . Depression   . History of cardiac arrhythmia STATES NO ISSUES  FOR A LONG TIME    ATENOLOL CONTROLS PALPITATIONS  . Non-ischemic cardiomyopathy ECHO IN 2006 W/ CHART    CARDIAC CATH 2009  W/ CHART  . History of echocardiogram 05-24-2005   NORMAL LVSF ,  EF  50-60%  . Sarcoidosis   . Positive TB test   . Chronic bronchitis   . History of Bell's palsy 2002--  LEFT SIDE-- NO RESIDUAL   . Pelvic pain in female   . Frequency of urination   . Urgency of urination   . Stress incontinence, female   . Nocturia   . Chronic pain syndrome   . History of glaucoma STATES TREATED FOR ABOUT 5 YRS  AGO  --- NO ISSUES SINCE  . Uterine fibroid   . Ankle edema BILATERAL  . Hyperlipidemia   . COPD (chronic obstructive pulmonary disease) CHRONIC BRONCHITIS --  LAST BOUT  BILATERAL PNEUMONIA 2009  . Iron deficiency anemia   . H/O hiatal hernia   . Fibromyalgia    . Arthritis     BACK, KNEE, HIPS  . Chronic renal insufficiency   . Diabetes mellitus without complication     Past Surgical History  Procedure Laterality Date  . Cardiac catheterization  09-09-2008    NORMAL CORONARY ARTERIES/ NORMAL LVSF/ CHRONIC RENAL INSUFF.  . Bilateral benign breast tumor removed  YRS AGO  . Tubal ligation  YRS AGO  . Cysto with hydrodistension  01/07/2012    Procedure: CYSTOSCOPY/HYDRODISTENSION;  Surgeon: Martina Sinner, MD;  Location: [facility];  Service: Urology;  Laterality: N/A;   OF BLADDER WITH INSTILLATION OF MARCAINE AND PRYDIUM  . Neck cyst      History reviewed. No pertinent family history.  History  Substance Use Topics  . Smoking status: Former Smoker -- 1.00 packs/day for 27 years    Types: Cigarettes    Quit date: 12/06/1991  . Smokeless tobacco: Never Used  . Alcohol Use: No    OB History   Grav Para Term Preterm Abortions TAB SAB Ect Mult Living                  Review of Systems  All systems reviewed and negative, other than as noted in HPI.   Allergies  Avapro; Lisinopril; and Latex  Home Medications   Current Outpatient Rx  Name  Route  Sig  Dispense  Refill  . acetaminophen (  TYLENOL) 650 MG CR tablet   Oral   Take 650 mg by mouth every 8 (eight) hours as needed for pain. pain         . albuterol (PROVENTIL HFA;VENTOLIN HFA) 108 (90 BASE) MCG/ACT inhaler   Inhalation   Inhale 2 puffs into the lungs every 6 (six) hours as needed for wheezing or shortness of breath. Shortness of breath         . aspirin-acetaminophen-caffeine (EXCEDRIN EXTRA STRENGTH) 250-250-65 MG per tablet   Oral   Take 2 tablets by mouth every 6 (six) hours as needed for pain.         Marland Kitchen atenolol (TENORMIN) 50 MG tablet   Oral   Take 75 mg by mouth daily.         . beclomethasone (QVAR) 80 MCG/ACT inhaler   Inhalation   Inhale 2 puffs into the lungs 2 (two) times daily.         . cetirizine (ZYRTEC) 10 MG  tablet   Oral   Take 10 mg by mouth daily.         . Cholecalciferol (VITAMIN D) 2000 UNITS CAPS   Oral   Take 2,000 Units by mouth daily.         Marland Kitchen esomeprazole (NEXIUM) 40 MG capsule   Oral   Take 40 mg by mouth daily before breakfast.         . Ferrous Gluconate (IRON) 240 (27 FE) MG TABS   Oral   Take 1 tablet by mouth daily.         . fluticasone (FLONASE) 50 MCG/ACT nasal spray   Nasal   Place 2 sprays into the nose as needed for allergies. congestion         . gabapentin (NEURONTIN) 600 MG tablet   Oral   Take 600 mg by mouth 3 (three) times daily.         Marland Kitchen glipiZIDE (GLUCOTROL XL) 10 MG 24 hr tablet   Oral   Take 10 mg by mouth daily. Before main meal         . metFORMIN (GLUCOPHAGE) 850 MG tablet   Oral   Take 850 mg by mouth 2 (two) times daily with a meal.         . methocarbamol (ROBAXIN) 750 MG tablet   Oral   Take 750 mg by mouth every 8 (eight) hours.          Marland Kitchen oxycodone (ROXICODONE) 30 MG immediate release tablet   Oral   Take 30 mg by mouth every 4 (four) hours as needed for pain. For pain         . simvastatin (ZOCOR) 40 MG tablet   Oral   Take 40 mg by mouth every evening.         . triamterene-hydrochlorothiazide (DYAZIDE) 37.5-25 MG per capsule   Oral   Take 1 capsule by mouth every morning.           BP 143/86  Pulse 76  Temp(Src) 99.7 F (37.6 C) (Oral)  Resp 16  SpO2 95%  Physical Exam  Nursing note and vitals reviewed. Constitutional: She is oriented to person, place, and time. She appears well-developed and well-nourished. No distress.  HENT:  Head: Normocephalic and atraumatic.  Sinus tenderness, particularly the frontal sinuses.  Eyes: Conjunctivae and EOM are normal. Pupils are equal, round, and reactive to light. Right eye exhibits no discharge. Left eye exhibits no discharge.  Neck: Neck supple.  No nuchal rigidity  Cardiovascular: Normal rate, regular rhythm and normal heart sounds.  Exam  reveals no gallop and no friction rub.   No murmur heard. Pulmonary/Chest: Effort normal and breath sounds normal. No respiratory distress.  Abdominal: Soft. She exhibits no distension. There is no tenderness.  Musculoskeletal: She exhibits no edema and no tenderness.  Neurological: She is alert and oriented to person, place, and time. No cranial nerve deficit. She exhibits normal muscle tone. Coordination normal.  Gait steady. Good finger nose testing bilaterally.  Skin: Skin is warm and dry. She is not diaphoretic.  Psychiatric: She has a normal mood and affect. Her behavior is normal. Thought content normal.    ED Course  Procedures (including critical care time)  Labs Reviewed - No data to display No results found.   1. Headache   2. Sinusitis       MDM  62 year old female with headache and cough. Suspect patient has sinusitis. Cough may be caused by postnasal drip. Chest x-ray is clear. She is experiencing no respiratory distress.   Consider emergent secondary causes such as bleed, infectious or mass but doubt. There is no history of trauma. Pt has a nonfocal neurological exam. Afebrile and neck supple. No use of blood thinning medication. Consider ocular etiology such as acute angle closure glaucoma but doubt. Pt denies acute change in visual acuity and eye exam unremarkable. Doubt temporal arteritis with no temporal tenderness and temporal artery pulsations palpable. Doubt CO poisoning. No contacts with similar symptoms. Doubt venous thrombosis. Doubt carotid or vertebral arteries dissection. Plan symptomatic treatment of her headache as well as treatment for sinusitis with amoxicillin.        Raeford Razor, MD 06/01/13 [phone]

## 2013-05-30 NOTE — ED Notes (Signed)
Patient transported to X-ray 

## 2013-10-17 ENCOUNTER — Emergency Department (HOSPITAL_COMMUNITY)
Admission: EM | Admit: 2013-10-17 | Discharge: 2013-10-17 | Disposition: A | Discharge status: Home / Self Care | Payer: Medicare Other | Attending: Emergency Medicine | Admitting: Emergency Medicine

## 2013-10-17 ENCOUNTER — Encounter (HOSPITAL_COMMUNITY): Payer: Self-pay | Admitting: Emergency Medicine

## 2013-10-17 DIAGNOSIS — IMO0001 Reserved for inherently not codable concepts without codable children: Secondary | ICD-10-CM | POA: Insufficient documentation

## 2013-10-17 DIAGNOSIS — IMO0002 Reserved for concepts with insufficient information to code with codable children: Secondary | ICD-10-CM | POA: Insufficient documentation

## 2013-10-17 DIAGNOSIS — Z8669 Personal history of other diseases of the nervous system and sense organs: Secondary | ICD-10-CM | POA: Insufficient documentation

## 2013-10-17 DIAGNOSIS — I120 Hypertensive chronic kidney disease with stage 5 chronic kidney disease or end stage renal disease: Secondary | ICD-10-CM | POA: Insufficient documentation

## 2013-10-17 DIAGNOSIS — Z91040 Latex allergy status: Secondary | ICD-10-CM | POA: Insufficient documentation

## 2013-10-17 DIAGNOSIS — Z862 Personal history of diseases of the blood and blood-forming organs and certain disorders involving the immune mechanism: Secondary | ICD-10-CM | POA: Insufficient documentation

## 2013-10-17 DIAGNOSIS — Z8542 Personal history of malignant neoplasm of other parts of uterus: Secondary | ICD-10-CM | POA: Insufficient documentation

## 2013-10-17 DIAGNOSIS — Z87891 Personal history of nicotine dependence: Secondary | ICD-10-CM | POA: Insufficient documentation

## 2013-10-17 DIAGNOSIS — E785 Hyperlipidemia, unspecified: Secondary | ICD-10-CM | POA: Insufficient documentation

## 2013-10-17 DIAGNOSIS — J449 Chronic obstructive pulmonary disease, unspecified: Secondary | ICD-10-CM | POA: Insufficient documentation

## 2013-10-17 DIAGNOSIS — Z9861 Coronary angioplasty status: Secondary | ICD-10-CM | POA: Insufficient documentation

## 2013-10-17 DIAGNOSIS — J4489 Other specified chronic obstructive pulmonary disease: Secondary | ICD-10-CM | POA: Insufficient documentation

## 2013-10-17 DIAGNOSIS — M129 Arthropathy, unspecified: Secondary | ICD-10-CM | POA: Insufficient documentation

## 2013-10-17 DIAGNOSIS — E119 Type 2 diabetes mellitus without complications: Secondary | ICD-10-CM | POA: Insufficient documentation

## 2013-10-17 DIAGNOSIS — G894 Chronic pain syndrome: Secondary | ICD-10-CM | POA: Insufficient documentation

## 2013-10-17 DIAGNOSIS — F329 Major depressive disorder, single episode, unspecified: Secondary | ICD-10-CM | POA: Insufficient documentation

## 2013-10-17 DIAGNOSIS — Z79899 Other long term (current) drug therapy: Secondary | ICD-10-CM | POA: Insufficient documentation

## 2013-10-17 DIAGNOSIS — N189 Chronic kidney disease, unspecified: Secondary | ICD-10-CM | POA: Insufficient documentation

## 2013-10-17 DIAGNOSIS — R51 Headache: Secondary | ICD-10-CM | POA: Insufficient documentation

## 2013-10-17 DIAGNOSIS — F3289 Other specified depressive episodes: Secondary | ICD-10-CM | POA: Insufficient documentation

## 2013-10-17 DIAGNOSIS — K219 Gastro-esophageal reflux disease without esophagitis: Secondary | ICD-10-CM | POA: Insufficient documentation

## 2013-10-17 DIAGNOSIS — Z8619 Personal history of other infectious and parasitic diseases: Secondary | ICD-10-CM | POA: Insufficient documentation

## 2013-10-17 MED ORDER — DIPHENHYDRAMINE HCL 25 MG PO CAPS
50.0000 mg | ORAL_CAPSULE | Freq: Once | ORAL | Status: AC
  Administered 2013-10-17: 50 mg via ORAL
  Filled 2013-10-17: qty 2

## 2013-10-17 MED ORDER — OXYCODONE-ACETAMINOPHEN 5-325 MG PO TABS
2.0000 | ORAL_TABLET | Freq: Once | ORAL | Status: AC
  Administered 2013-10-17: 2 via ORAL
  Filled 2013-10-17: qty 2

## 2013-10-17 MED ORDER — KETOROLAC TROMETHAMINE 30 MG/ML IJ SOLN
30.0000 mg | Freq: Once | INTRAMUSCULAR | Status: AC
  Administered 2013-10-17: 30 mg via INTRAMUSCULAR
  Filled 2013-10-17: qty 1

## 2013-10-17 MED ORDER — METOCLOPRAMIDE HCL 5 MG/ML IJ SOLN
10.0000 mg | Freq: Once | INTRAMUSCULAR | Status: AC
  Administered 2013-10-17: 10 mg via INTRAMUSCULAR
  Filled 2013-10-17: qty 2

## 2013-10-17 NOTE — ED Provider Notes (Signed)
I saw and evaluated the patient, reviewed the resident's note and I agree with the findings and plan.  EKG Interpretation   None       Pt with non focal deficits, ongoing HA daily for about 3 weeks.  No recent trauma, no fever, no stiff neck.  Pt has h/o chronic HA's, seen by PCP and in the ED in the past.  Pt has chronic pain, takes robaxin and oxycodone 30 mg q4 hours prn daily.  Pt has not stopped taking it, but acknowledges may have cut back on it recently.  I discussed possible rebound HA, stress HA and sinus.  Pt is on several sinus meds already.  No imaging needed.  Will give IM meds reglan and toradol for possible migraine like etiology and pt can follow up with PCP Dr. Mikeal Hawthorne this week.    Gavin Pound. Oletta Lamas, MD 10/21/13 2130

## 2013-10-17 NOTE — ED Notes (Signed)
Pt brought back to room from triage; pt getting undressed and into a gown at this time; family at bedside

## 2013-10-17 NOTE — ED Notes (Addendum)
Per pt sts fell a few weeks ago and hit her head. sts since has had a HA. Denies being on blood thinners. sts also some upper back pain. Denies vision changes.

## 2013-10-18 NOTE — ED Provider Notes (Signed)
CSN: [id number]     Arrival date & time 10/17/13  1414 History   First MD Initiated Contact with Patient 10/17/13 1609     Chief Complaint  Patient presents with  . Headache   Patient is a 62 y.o. female presenting with headaches. The history is provided by the patient. No language interpreter was used.  Headache Pain location:  Generalized Quality:  Dull Radiates to:  Does not radiate Severity currently:  9/10 Severity at highest:  9/10 Onset quality:  Gradual Duration:  4 weeks (Began around the same time as a fall) Timing:  Intermittent Progression:  Unchanged Chronicity:  Chronic Similar to prior headaches: yes   Context: not activity, not exposure to bright light, not loud noise and not straining   Relieved by:  Nothing Worsened by:  Nothing tried Ineffective treatments:  None tried Associated symptoms: no abdominal pain, no back pain, no cough, no ear pain, no pain, no facial pain, no fever, no hearing loss, no loss of balance, no nausea, no neck pain, no numbness, no seizures, no vomiting and no weakness     Past Medical History  Diagnosis Date  . Hypertension   . GERD (gastroesophageal reflux disease)   . Depression   . History of cardiac arrhythmia STATES NO ISSUES  FOR A LONG TIME    ATENOLOL CONTROLS PALPITATIONS  . Non-ischemic cardiomyopathy ECHO IN 2006 W/ CHART    CARDIAC CATH 2009  W/ CHART  . History of echocardiogram 05-24-2005   NORMAL LVSF ,  EF  50-60%  . Sarcoidosis   . Positive TB test   . Chronic bronchitis   . History of Bell's palsy 2002--  LEFT SIDE-- NO RESIDUAL   . Pelvic pain in female   . Frequency of urination   . Urgency of urination   . Stress incontinence, female   . Nocturia   . Chronic pain syndrome   . History of glaucoma STATES TREATED FOR ABOUT 5 YRS  AGO  --- NO ISSUES SINCE  . Uterine fibroid   . Ankle edema BILATERAL  . Hyperlipidemia   . COPD (chronic obstructive pulmonary disease) CHRONIC BRONCHITIS --  LAST BOUT   BILATERAL PNEUMONIA 2009  . Iron deficiency anemia   . H/O hiatal hernia   . Fibromyalgia   . Arthritis     BACK, KNEE, HIPS  . Chronic renal insufficiency   . Diabetes mellitus without complication    Past Surgical History  Procedure Laterality Date  . Cardiac catheterization  09-09-2008    NORMAL CORONARY ARTERIES/ NORMAL LVSF/ CHRONIC RENAL INSUFF.  . Bilateral benign breast tumor removed  YRS AGO  . Tubal ligation  YRS AGO  . Cysto with hydrodistension  01/07/2012    Procedure: CYSTOSCOPY/HYDRODISTENSION;  Surgeon: Martina Sinner, MD;  Location: [facility];  Service: Urology;  Laterality: N/A;   OF BLADDER WITH INSTILLATION OF MARCAINE AND PRYDIUM  . Neck cyst     History reviewed. No pertinent family history. History  Substance Use Topics  . Smoking status: Former Smoker -- 1.00 packs/day for 27 years    Types: Cigarettes    Quit date: 12/06/1991  . Smokeless tobacco: Never Used  . Alcohol Use: No   OB History   Grav Para Term Preterm Abortions TAB SAB Ect Mult Living                 Review of Systems  Constitutional: Negative for fever.  HENT: Negative for ear pain and  hearing loss.   Eyes: Negative for pain.  Respiratory: Negative for cough.   Gastrointestinal: Negative for nausea, vomiting and abdominal pain.  Musculoskeletal: Negative for back pain and neck pain.  Neurological: Positive for headaches. Negative for seizures, numbness and loss of balance.  All other systems reviewed and are negative.    Allergies  Avapro; Lisinopril; and Latex  Home Medications   Current Outpatient Rx  Name  Route  Sig  Dispense  Refill  . acetaminophen (TYLENOL) 650 MG CR tablet   Oral   Take 650 mg by mouth every 8 (eight) hours as needed for pain. pain         . albuterol (PROVENTIL HFA;VENTOLIN HFA) 108 (90 BASE) MCG/ACT inhaler   Inhalation   Inhale 2 puffs into the lungs every 6 (six) hours as needed for wheezing or shortness of breath.  Shortness of breath         . aspirin-acetaminophen-caffeine (EXCEDRIN EXTRA STRENGTH) 250-250-65 MG per tablet   Oral   Take 2 tablets by mouth every 6 (six) hours as needed for pain.         Marland Kitchen atenolol (TENORMIN) 50 MG tablet   Oral   Take 75 mg by mouth daily.         . beclomethasone (QVAR) 80 MCG/ACT inhaler   Inhalation   Inhale 2 puffs into the lungs 2 (two) times daily.         . cetirizine (ZYRTEC) 10 MG tablet   Oral   Take 10 mg by mouth daily.         . Cholecalciferol (VITAMIN D) 2000 UNITS CAPS   Oral   Take 2,000 Units by mouth daily.         Marland Kitchen esomeprazole (NEXIUM) 40 MG capsule   Oral   Take 40 mg by mouth daily before breakfast.         . Ferrous Gluconate (IRON) 240 (27 FE) MG TABS   Oral   Take 240 mg by mouth daily.          . fluticasone (FLONASE) 50 MCG/ACT nasal spray   Nasal   Place 2 sprays into the nose as needed for allergies. congestion         . furosemide (LASIX) 40 MG tablet   Oral   Take 40 mg by mouth.         . gabapentin (NEURONTIN) 600 MG tablet   Oral   Take 600 mg by mouth 3 (three) times daily.         Marland Kitchen glipiZIDE (GLUCOTROL XL) 10 MG 24 hr tablet   Oral   Take 10 mg by mouth daily. Before main meal         . metFORMIN (GLUCOPHAGE) 850 MG tablet   Oral   Take 850 mg by mouth 2 (two) times daily with a meal.         . methocarbamol (ROBAXIN) 750 MG tablet   Oral   Take 750 mg by mouth every 8 (eight) hours.          Marland Kitchen oxycodone (ROXICODONE) 30 MG immediate release tablet   Oral   Take 30 mg by mouth every 4 (four) hours as needed for pain. For pain         . simvastatin (ZOCOR) 40 MG tablet   Oral   Take 40 mg by mouth every evening.         Marland Kitchen spironolactone (ALDACTONE) 25 MG tablet  Oral   Take 25 mg by mouth daily.          BP 150/88  Pulse 62  Temp(Src) 98.3 F (36.8 C) (Oral)  Resp 18  SpO2 97% Physical Exam  Nursing note and vitals reviewed. Constitutional: She is  oriented to person, place, and time. She appears well-developed and well-nourished. No distress.  HENT:  Head: Normocephalic and atraumatic.  Eyes: Pupils are equal, round, and reactive to light. Right pupil is round. Left pupil is round. Pupils are equal.  Neck: Normal range of motion.  Cardiovascular: Normal rate, regular rhythm, normal heart sounds and intact distal pulses.   Pulmonary/Chest: Effort normal. No respiratory distress. She has no wheezes. She exhibits no tenderness.  Abdominal: Soft. Bowel sounds are normal. She exhibits no distension. There is no tenderness. There is no rebound and no guarding.  Neurological: She is alert and oriented to person, place, and time. She has normal strength. No cranial nerve deficit or sensory deficit. She exhibits normal muscle tone. Coordination and gait normal.  Skin: Skin is warm and dry.    ED Course  Procedures (including critical care time) Labs Review Labs Reviewed - No data to display Imaging Review No results found.  EKG Interpretation   None       MDM  Ms. Oboyle is a 62 year old African American female who comes emergency department today for headache. Physical exam as above. With history of chronic sinusitis diagnosed 2 weeks ago. Was felt to likely be the cause of her headache. Despite a fall with head injury a month ago. She has no weakness, numbness, as result of intracranial injury as a result of the fall. With no neurologic findings on exam, she was not felt to require a CT of her head. Ms. Belongia pain was treated with Percocet and a headache cocktail. This relieved her headache. She was felt to be stable for discharge. She was instructed to followup with her primary care physician for an expression medications.  Doubt SAH or meningitis.  She was instructed to return to the emergency department if she develops weakness, numbness, fevers, or any other concerns. She expressed understanding. She was discharged in stable condition.  Care was discussed with my attending Dr. Oletta Lamas.    1. Headache        Bethann Berkshire, MD 10/18/13 [phone]

## 2013-12-08 ENCOUNTER — Emergency Department (HOSPITAL_COMMUNITY)
Admission: EM | Admit: 2013-12-08 | Discharge: 2013-12-08 | Disposition: A | Discharge status: Home / Self Care | Payer: No Typology Code available for payment source | Attending: Emergency Medicine | Admitting: Emergency Medicine

## 2013-12-08 ENCOUNTER — Encounter (HOSPITAL_COMMUNITY): Payer: Self-pay | Admitting: Emergency Medicine

## 2013-12-08 DIAGNOSIS — S39012A Strain of muscle, fascia and tendon of lower back, initial encounter: Secondary | ICD-10-CM

## 2013-12-08 DIAGNOSIS — M129 Arthropathy, unspecified: Secondary | ICD-10-CM | POA: Insufficient documentation

## 2013-12-08 DIAGNOSIS — J4489 Other specified chronic obstructive pulmonary disease: Secondary | ICD-10-CM | POA: Insufficient documentation

## 2013-12-08 DIAGNOSIS — I10 Essential (primary) hypertension: Secondary | ICD-10-CM | POA: Insufficient documentation

## 2013-12-08 DIAGNOSIS — Z8669 Personal history of other diseases of the nervous system and sense organs: Secondary | ICD-10-CM | POA: Insufficient documentation

## 2013-12-08 DIAGNOSIS — E119 Type 2 diabetes mellitus without complications: Secondary | ICD-10-CM | POA: Insufficient documentation

## 2013-12-08 DIAGNOSIS — F329 Major depressive disorder, single episode, unspecified: Secondary | ICD-10-CM | POA: Insufficient documentation

## 2013-12-08 DIAGNOSIS — S335XXA Sprain of ligaments of lumbar spine, initial encounter: Secondary | ICD-10-CM | POA: Insufficient documentation

## 2013-12-08 DIAGNOSIS — Y9389 Activity, other specified: Secondary | ICD-10-CM | POA: Insufficient documentation

## 2013-12-08 DIAGNOSIS — K219 Gastro-esophageal reflux disease without esophagitis: Secondary | ICD-10-CM | POA: Insufficient documentation

## 2013-12-08 DIAGNOSIS — Z91040 Latex allergy status: Secondary | ICD-10-CM | POA: Insufficient documentation

## 2013-12-08 DIAGNOSIS — E785 Hyperlipidemia, unspecified: Secondary | ICD-10-CM | POA: Insufficient documentation

## 2013-12-08 DIAGNOSIS — N189 Chronic kidney disease, unspecified: Secondary | ICD-10-CM | POA: Insufficient documentation

## 2013-12-08 DIAGNOSIS — Z79899 Other long term (current) drug therapy: Secondary | ICD-10-CM | POA: Insufficient documentation

## 2013-12-08 DIAGNOSIS — Z8742 Personal history of other diseases of the female genital tract: Secondary | ICD-10-CM | POA: Insufficient documentation

## 2013-12-08 DIAGNOSIS — I129 Hypertensive chronic kidney disease with stage 1 through stage 4 chronic kidney disease, or unspecified chronic kidney disease: Secondary | ICD-10-CM | POA: Insufficient documentation

## 2013-12-08 DIAGNOSIS — Z8619 Personal history of other infectious and parasitic diseases: Secondary | ICD-10-CM | POA: Insufficient documentation

## 2013-12-08 DIAGNOSIS — Z87891 Personal history of nicotine dependence: Secondary | ICD-10-CM | POA: Insufficient documentation

## 2013-12-08 DIAGNOSIS — F3289 Other specified depressive episodes: Secondary | ICD-10-CM | POA: Insufficient documentation

## 2013-12-08 DIAGNOSIS — Y92410 Unspecified street and highway as the place of occurrence of the external cause: Secondary | ICD-10-CM | POA: Insufficient documentation

## 2013-12-08 DIAGNOSIS — Z87448 Personal history of other diseases of urinary system: Secondary | ICD-10-CM | POA: Insufficient documentation

## 2013-12-08 DIAGNOSIS — IMO0002 Reserved for concepts with insufficient information to code with codable children: Secondary | ICD-10-CM | POA: Insufficient documentation

## 2013-12-08 DIAGNOSIS — IMO0001 Reserved for inherently not codable concepts without codable children: Secondary | ICD-10-CM | POA: Insufficient documentation

## 2013-12-08 DIAGNOSIS — D509 Iron deficiency anemia, unspecified: Secondary | ICD-10-CM | POA: Insufficient documentation

## 2013-12-08 DIAGNOSIS — J449 Chronic obstructive pulmonary disease, unspecified: Secondary | ICD-10-CM | POA: Insufficient documentation

## 2013-12-08 DIAGNOSIS — Z95818 Presence of other cardiac implants and grafts: Secondary | ICD-10-CM | POA: Insufficient documentation

## 2013-12-08 DIAGNOSIS — G894 Chronic pain syndrome: Secondary | ICD-10-CM | POA: Insufficient documentation

## 2013-12-08 DIAGNOSIS — S0990XA Unspecified injury of head, initial encounter: Secondary | ICD-10-CM | POA: Insufficient documentation

## 2013-12-08 DIAGNOSIS — Z8611 Personal history of tuberculosis: Secondary | ICD-10-CM | POA: Insufficient documentation

## 2013-12-08 MED ORDER — DIAZEPAM 5 MG PO TABS
5.0000 mg | ORAL_TABLET | Freq: Once | ORAL | Status: AC
  Administered 2013-12-08: 5 mg via ORAL
  Filled 2013-12-08: qty 1

## 2013-12-08 MED ORDER — DIAZEPAM 5 MG PO TABS: 5.0000 mg | ORAL_TABLET | Freq: Two times a day (BID) | ORAL | Status: DC
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
## 2013-12-11 NOTE — ED Provider Notes (Signed)
Medical screening examination/treatment/procedure(s) were performed by non-physician practitioner and as supervising physician I was immediately available for consultation/collaboration.  Athalia Setterlund T Nai Borromeo, MD 12/11/13 1630 

## 2014-01-02 ENCOUNTER — Encounter (HOSPITAL_COMMUNITY): Payer: Self-pay | Admitting: Emergency Medicine

## 2014-01-02 DIAGNOSIS — D509 Iron deficiency anemia, unspecified: Secondary | ICD-10-CM | POA: Diagnosis not present

## 2014-01-02 DIAGNOSIS — H409 Unspecified glaucoma: Secondary | ICD-10-CM | POA: Insufficient documentation

## 2014-01-02 DIAGNOSIS — Z8742 Personal history of other diseases of the female genital tract: Secondary | ICD-10-CM | POA: Insufficient documentation

## 2014-01-02 DIAGNOSIS — N189 Chronic kidney disease, unspecified: Secondary | ICD-10-CM | POA: Diagnosis not present

## 2014-01-02 DIAGNOSIS — M129 Arthropathy, unspecified: Secondary | ICD-10-CM | POA: Insufficient documentation

## 2014-01-02 DIAGNOSIS — Z87448 Personal history of other diseases of urinary system: Secondary | ICD-10-CM | POA: Insufficient documentation

## 2014-01-02 DIAGNOSIS — Z91040 Latex allergy status: Secondary | ICD-10-CM | POA: Insufficient documentation

## 2014-01-02 DIAGNOSIS — R079 Chest pain, unspecified: Secondary | ICD-10-CM | POA: Diagnosis present

## 2014-01-02 DIAGNOSIS — Z79899 Other long term (current) drug therapy: Secondary | ICD-10-CM | POA: Diagnosis not present

## 2014-01-02 DIAGNOSIS — Z8669 Personal history of other diseases of the nervous system and sense organs: Secondary | ICD-10-CM | POA: Insufficient documentation

## 2014-01-02 DIAGNOSIS — K219 Gastro-esophageal reflux disease without esophagitis: Secondary | ICD-10-CM | POA: Diagnosis not present

## 2014-01-02 DIAGNOSIS — J4489 Other specified chronic obstructive pulmonary disease: Secondary | ICD-10-CM | POA: Insufficient documentation

## 2014-01-02 DIAGNOSIS — Z87891 Personal history of nicotine dependence: Secondary | ICD-10-CM | POA: Diagnosis not present

## 2014-01-02 DIAGNOSIS — IMO0002 Reserved for concepts with insufficient information to code with codable children: Secondary | ICD-10-CM | POA: Insufficient documentation

## 2014-01-02 DIAGNOSIS — G894 Chronic pain syndrome: Secondary | ICD-10-CM | POA: Insufficient documentation

## 2014-01-02 DIAGNOSIS — Z9889 Other specified postprocedural states: Secondary | ICD-10-CM | POA: Insufficient documentation

## 2014-01-02 DIAGNOSIS — I129 Hypertensive chronic kidney disease with stage 1 through stage 4 chronic kidney disease, or unspecified chronic kidney disease: Secondary | ICD-10-CM | POA: Diagnosis not present

## 2014-01-02 DIAGNOSIS — J449 Chronic obstructive pulmonary disease, unspecified: Secondary | ICD-10-CM | POA: Insufficient documentation

## 2014-01-02 DIAGNOSIS — E785 Hyperlipidemia, unspecified: Secondary | ICD-10-CM | POA: Insufficient documentation

## 2014-01-02 DIAGNOSIS — R0789 Other chest pain: Secondary | ICD-10-CM | POA: Insufficient documentation

## 2014-01-02 DIAGNOSIS — M25569 Pain in unspecified knee: Secondary | ICD-10-CM | POA: Diagnosis not present

## 2014-01-02 DIAGNOSIS — Z8619 Personal history of other infectious and parasitic diseases: Secondary | ICD-10-CM | POA: Insufficient documentation

## 2014-01-02 DIAGNOSIS — E119 Type 2 diabetes mellitus without complications: Secondary | ICD-10-CM | POA: Diagnosis not present

## 2014-01-02 LAB — BASIC METABOLIC PANEL
BUN: 14 mg/dL (ref 6–23)
CALCIUM: 9.1 mg/dL (ref 8.4–10.5)
CO2: 29 mEq/L (ref 19–32)
Chloride: 98 mEq/L (ref 96–112)
Creatinine, Ser: 0.76 mg/dL (ref 0.50–1.10)
GFR, EST NON AFRICAN AMERICAN: 89 mL/min — AB (ref >90)
Glucose, Bld: 76 mg/dL (ref 70–99)
POTASSIUM: 4 meq/L (ref 3.7–5.3)
SODIUM: 139 meq/L (ref 137–147)

## 2014-01-02 LAB — CBC
HCT: 33.2 % — ABNORMAL LOW (ref 36.0–46.0)
HEMOGLOBIN: 10.9 g/dL — AB (ref 12.0–15.0)
MCH: 28.3 pg (ref 26.0–34.0)
MCHC: 32.8 g/dL (ref 30.0–36.0)
MCV: 86.2 fL (ref 78.0–100.0)
PLATELETS: 207 10*3/uL (ref 150–400)
RBC: 3.85 MIL/uL — ABNORMAL LOW (ref 3.87–5.11)
RDW: 15.1 % (ref 11.5–15.5)
WBC: 6.3 10*3/uL (ref 4.0–10.5)

## 2014-01-02 LAB — POCT I-STAT TROPONIN I: TROPONIN I, POC: 0 ng/mL (ref 0.00–0.08)

## 2014-01-02 NOTE — ED Notes (Signed)
Pt in c/o chest pain that is reproducible with palpation, also c/o bilateral knee pain and swelling, states the knee pain started a few days ago and the chest pain started today

## 2014-01-03 ENCOUNTER — Encounter (HOSPITAL_COMMUNITY): Payer: Self-pay | Admitting: Emergency Medicine

## 2014-01-03 ENCOUNTER — Emergency Department (HOSPITAL_COMMUNITY)
Admission: EM | Admit: 2014-01-03 | Discharge: 2014-01-03 | Disposition: A | Discharge status: Home / Self Care | Payer: Medicare Other | Attending: Emergency Medicine | Admitting: Emergency Medicine

## 2014-01-03 ENCOUNTER — Emergency Department (HOSPITAL_COMMUNITY): Payer: Medicare Other

## 2014-01-03 DIAGNOSIS — M25569 Pain in unspecified knee: Secondary | ICD-10-CM

## 2014-01-03 DIAGNOSIS — R079 Chest pain, unspecified: Secondary | ICD-10-CM

## 2014-01-03 DIAGNOSIS — R0789 Other chest pain: Secondary | ICD-10-CM | POA: Diagnosis not present

## 2014-01-03 LAB — POCT I-STAT TROPONIN I: TROPONIN I, POC: 0 ng/mL (ref 0.00–0.08)

## 2014-01-03 MED ORDER — ASPIRIN 81 MG PO CHEW
324.0000 mg | CHEWABLE_TABLET | Freq: Once | ORAL | Status: AC
  Administered 2014-01-03: 324 mg via ORAL
  Filled 2014-01-03: qty 4

## 2014-01-03 NOTE — Discharge Instructions (Signed)
Aspirin and Your Heart Aspirin affects the way your blood clots and helps "thin" the blood. Aspirin has many uses in heart disease. It may be used as a primary prevention to help reduce the risk of heart related events. It also can be used as a secondary measure to prevent more heart attacks or to prevent additional damage from blood clots.  ASPIRIN MAY HELP IF YOU:  Have had a heart attack or chest pain.  Have undergone open heart surgery such as CABG (Coronary Artery Bypass Surgery).  Have had coronary angioplasty with or without stents.  Have experienced a stroke or TIA (transient ischemic attack).  Have peripheral vascular disease (PAD).  Have chronic heart rhythm problems such as atrial fibrillation.  Are at risk for heart disease. BEFORE STARTING ASPIRIN Before you start taking aspirin, your caregiver will need to review your medical history. Many things will need to be taken into consideration, such as:  Smoking status.  Blood pressure.  Diabetes.  Gender.  Weight.  Cholesterol level. ASPIRIN DOSES  Aspirin should only be taken on the advice of your caregiver. Talk to your caregiver about how much aspirin you should take. Aspirin comes in different doses such as:  81 mg.  162 mg.  325 mg.  The aspirin dose you take may be affected by many factors, some of which include:  Your current medications, especially if your are taking blood-thinners or anti-platelet medicine.  Liver function.  Heart disease risk.  Age.  Aspirin comes in two forms:  Non-enteric-coated. This type of aspirin does not have a coating and is absorbed faster. Non-enteric coated aspirin is recommended for patients experiencing chest pain symptoms. This type of aspirin also comes in a chewable form.  Enteric-coated. This means the aspirin has a special coating that releases the medicine very slowly. Enteric-coated aspirin causes less stomach upset. This type of aspirin should not be chewed  or crushed. ASPIRIN SIDE EFFECTS Daily use of aspirin can increase your risk of serious side effects. Some of these include:  Increased bleeding. This can range from a cut that does not stop bleeding to more serious problems such as stomach bleeding or bleeding into the brain (Intracerebral bleeding).  Increased bruising.  Stomach upset.  An allergic reaction such as red, itchy skin.  Increased risk of bleeding when combined with non-steroidal anti-inflammatory medicine (NSAIDS).  Alcohol should be drank in moderation when taking aspirin. Alcohol can increase the risk of stomach bleeding when taken with aspirin.  Aspirin should not be given to children less than 29 years of age due to the association of Reye syndrome. Reye syndrome is a serious illness that can affect the brain and liver. Studies have linked Reye syndrome with aspirin use in children.  People that have nasal polyps have an increased risk of developing an aspirin allergy. SEEK MEDICAL CARE IF:   You develop an allergic reaction such as:  Hives.  Itchy skin.  Swelling of the lips, tongue or face.  You develop stomach pain.  You have unusual bleeding or bruising.  You have ringing in your ears. SEEK IMMEDIATE MEDICAL CARE IF:   You have severe chest pain, especially if the pain is crushing or pressure-like and spreads to the arms, back, neck, or jaw. THIS IS AN EMERGENCY. Do not wait to see if the pain will go away. Get medical help at once. Call your local emergency services (911 in the [place] DO NOT drive yourself to the hospital.  You have stroke-like symptoms  such as:  Loss of vision.  Difficulty talking.  Numbness or weakness on one side of your body.  Numbness or weakness in your arm or leg.  Not thinking clearly or feeling confused.  Your bowel movements are bloody, dark red or black in color.  You vomit or cough up blood.  You have blood in your urine.  You have shortness of breath,  coughing or wheezing. MAKE SURE YOU:   Understand these instructions.  Will monitor your condition.  Seek immediate medical care if necessary. Document Released: 11/21/2008 Document Revised: 04/05/2013 Document Reviewed: 11/21/2008 [facility] Patient Information 2014 [place].  Chest Pain (Nonspecific) It is often hard to give a specific diagnosis for the cause of chest pain. There is always a chance that your pain could be related to something serious, such as a heart attack or a blood clot in the lungs. You need to follow up with your caregiver for further evaluation. CAUSES   Heartburn.  Pneumonia or bronchitis.  Anxiety or stress.  Inflammation around your heart (pericarditis) or lung (pleuritis or pleurisy).  A blood clot in the lung.  A collapsed lung (pneumothorax). It can develop suddenly on its own (spontaneous pneumothorax) or from injury (trauma) to the chest.  Shingles infection (herpes zoster virus). The chest wall is composed of bones, muscles, and cartilage. Any of these can be the source of the pain.  The bones can be bruised by injury.  The muscles or cartilage can be strained by coughing or overwork.  The cartilage can be affected by inflammation and become sore (costochondritis). DIAGNOSIS  Lab tests or other studies, such as X-rays, electrocardiography, stress testing, or cardiac imaging, may be needed to find the cause of your pain.  TREATMENT   Treatment depends on what may be causing your chest pain. Treatment may include:  Acid blockers for heartburn.  Anti-inflammatory medicine.  Pain medicine for inflammatory conditions.  Antibiotics if an infection is present.  You may be advised to change lifestyle habits. This includes stopping smoking and avoiding alcohol, caffeine, and chocolate.  You may be advised to keep your head raised (elevated) when sleeping. This reduces the chance of acid going backward from your stomach into your  esophagus.  Most of the time, nonspecific chest pain will improve within 2 to 3 days with rest and mild pain medicine. HOME CARE INSTRUCTIONS   If antibiotics were prescribed, take your antibiotics as directed. Finish them even if you start to feel better.  For the next few days, avoid physical activities that bring on chest pain. Continue physical activities as directed.  Do not smoke.  Avoid drinking alcohol.  Only take over-the-counter or prescription medicine for pain, discomfort, or fever as directed by your caregiver.  Follow your caregiver's suggestions for further testing if your chest pain does not go away.  Keep any follow-up appointments you made. If you do not go to an appointment, you could develop lasting (chronic) problems with pain. If there is any problem keeping an appointment, you must call to reschedule. SEEK MEDICAL CARE IF:   You think you are having problems from the medicine you are taking. Read your medicine instructions carefully.  Your chest pain does not go away, even after treatment.  You develop a rash with blisters on your chest. SEEK IMMEDIATE MEDICAL CARE IF:   You have increased chest pain or pain that spreads to your arm, neck, jaw, back, or abdomen.  You develop shortness of breath, an increasing cough, or  are coughing up blood. °· You have severe back or abdominal pain, feel nauseous, or vomit. °· You develop severe weakness, fainting, or chills. °· You have a fever. °THIS IS AN EMERGENCY. Do not wait to see if the pain will go away. Get medical help at once. Call your local emergency services (911 in [place] Do not drive yourself to the hospital. °MAKE SURE YOU:  °· Understand these instructions. °· Will watch your condition. °· Will get help right away if you are not doing well or get worse. °Document Released: 09/18/2005 Document Revised: 03/02/2012 Document Reviewed: 07/14/2008 °[employer]® Patient Information ©2014 [employer]. ° °

## 2014-01-03 NOTE — ED Provider Notes (Signed)
CSN: [id number]     Arrival date & time 01/02/14  2027 History   First MD Initiated Contact with Patient 01/03/14 0150     Chief Complaint  Patient presents with  . Chest Pain  . Knee Pain   (Consider location/radiation/quality/duration/timing/severity/associated sxs/prior Treatment) HPI History provided by patient. Has history of chronic pain, hypertension, COPD/bronchitis. Today at home around 12 PM noon developed substernal chest pain described as tightness and sharp and stabbing pains. This lasted about an hour and resolved. No shortness of breath. No wheezing. No fevers or chills. No cough. Tonight before going to bed patient not feeling well, describes ongoing bilateral knee pain with history of arthritis presents here for evaluation. She declines any pain medications. She denies any unilateral swelling or pain. No trauma. Symptoms moderate in severity. Past Medical History  Diagnosis Date  . Hypertension   . GERD (gastroesophageal reflux disease)   . Depression   . History of cardiac arrhythmia STATES NO ISSUES  FOR A LONG TIME    ATENOLOL CONTROLS PALPITATIONS  . Non-ischemic cardiomyopathy ECHO IN 2006 W/ CHART    CARDIAC CATH 2009  W/ CHART  . History of echocardiogram 05-24-2005   NORMAL LVSF ,  EF  50-60%  . Sarcoidosis   . Positive TB test   . Chronic bronchitis   . History of Bell's palsy 2002--  LEFT SIDE-- NO RESIDUAL   . Pelvic pain in female   . Frequency of urination   . Urgency of urination   . Stress incontinence, female   . Nocturia   . Chronic pain syndrome   . History of glaucoma STATES TREATED FOR ABOUT 5 YRS  AGO  --- NO ISSUES SINCE  . Uterine fibroid   . Ankle edema BILATERAL  . Hyperlipidemia   . COPD (chronic obstructive pulmonary disease) CHRONIC BRONCHITIS --  LAST BOUT  BILATERAL PNEUMONIA 2009  . Iron deficiency anemia   . H/O hiatal hernia   . Fibromyalgia   . Arthritis     BACK, KNEE, HIPS  . Chronic renal insufficiency   . Diabetes  mellitus without complication    Past Surgical History  Procedure Laterality Date  . Cardiac catheterization  09-09-2008    NORMAL CORONARY ARTERIES/ NORMAL LVSF/ CHRONIC RENAL INSUFF.  . Bilateral benign breast tumor removed  YRS AGO  . Tubal ligation  YRS AGO  . Cysto with hydrodistension  01/07/2012    Procedure: CYSTOSCOPY/HYDRODISTENSION;  Surgeon: Reece Packer, MD;  Location: [facility];  Service: Urology;  Laterality: N/A;   OF BLADDER WITH INSTILLATION OF MARCAINE AND PRYDIUM  . Neck cyst     History reviewed. No pertinent family history. History  Substance Use Topics  . Smoking status: Former Smoker -- 1.00 packs/day for 27 years    Types: Cigarettes    Quit date: 12/06/1991  . Smokeless tobacco: Never Used  . Alcohol Use: No   OB History   Grav Para Term Preterm Abortions TAB SAB Ect Mult Living                 Review of Systems  Constitutional: Negative for fever and chills.  Respiratory: Negative for shortness of breath.   Cardiovascular: Positive for chest pain.  Gastrointestinal: Negative for abdominal pain.  Genitourinary: Negative for dysuria.  Musculoskeletal: Negative for back pain, neck pain and neck stiffness.  Skin: Negative for rash.  Neurological: Negative for headaches.  All other systems reviewed and are negative.    Allergies  Avapro; Lisinopril; and Latex  Home Medications   Current Outpatient Rx  Name  Route  Sig  Dispense  Refill  . acetaminophen (TYLENOL) 650 MG CR tablet   Oral   Take 650 mg by mouth every 8 (eight) hours as needed for pain. pain         . albuterol (PROVENTIL HFA;VENTOLIN HFA) 108 (90 BASE) MCG/ACT inhaler   Inhalation   Inhale 2 puffs into the lungs every 6 (six) hours as needed for wheezing or shortness of breath. Shortness of breath         . aspirin-acetaminophen-caffeine (EXCEDRIN EXTRA STRENGTH) 250-250-65 MG per tablet   Oral   Take 2 tablets by mouth every 6 (six) hours as needed  for pain.         Marland Kitchen atenolol (TENORMIN) 50 MG tablet   Oral   Take 75 mg by mouth daily.         . beclomethasone (QVAR) 80 MCG/ACT inhaler   Inhalation   Inhale 2 puffs into the lungs 2 (two) times daily.         . cetirizine (ZYRTEC) 10 MG tablet   Oral   Take 10 mg by mouth daily.         . Cholecalciferol (VITAMIN D) 2000 UNITS CAPS   Oral   Take 2,000 Units by mouth daily.         . diazepam (VALIUM) 5 MG tablet   Oral   Take 1 tablet (5 mg total) by mouth 2 (two) times daily.   10 tablet   0   . esomeprazole (NEXIUM) 40 MG capsule   Oral   Take 40 mg by mouth daily before breakfast.         . Ferrous Gluconate (IRON) 240 (27 FE) MG TABS   Oral   Take 240 mg by mouth daily.          . fluticasone (FLONASE) 50 MCG/ACT nasal spray   Nasal   Place 2 sprays into the nose as needed for allergies. congestion         . furosemide (LASIX) 40 MG tablet   Oral   Take 40 mg by mouth daily.          Marland Kitchen gabapentin (NEURONTIN) 600 MG tablet   Oral   Take 600 mg by mouth 3 (three) times daily.         Marland Kitchen glipiZIDE (GLUCOTROL XL) 10 MG 24 hr tablet   Oral   Take 10 mg by mouth daily. Before main meal         . ibuprofen (ADVIL,MOTRIN) 600 MG tablet   Oral   Take 600 mg by mouth every 6 (six) hours as needed for headache.         . metFORMIN (GLUCOPHAGE) 850 MG tablet   Oral   Take 850 mg by mouth 2 (two) times daily with a meal.         . methocarbamol (ROBAXIN) 750 MG tablet   Oral   Take 750 mg by mouth every 8 (eight) hours.          Marland Kitchen oxycodone (ROXICODONE) 30 MG immediate release tablet   Oral   Take 30 mg by mouth every 4 (four) hours as needed for pain. For pain         . simvastatin (ZOCOR) 40 MG tablet   Oral   Take 40 mg by mouth every evening.         Marland Kitchen  spironolactone (ALDACTONE) 25 MG tablet   Oral   Take 25 mg by mouth daily.          BP 131/76  Pulse 66  Temp(Src) 98 F (36.7 C) (Oral)  Resp 12  Wt 258 lb  (117.028 kg)  SpO2 100% Physical Exam  Constitutional: She is oriented to person, place, and time. She appears well-developed and well-nourished.  HENT:  Head: Normocephalic and atraumatic.  Eyes: EOM are normal. Pupils are equal, round, and reactive to light.  Neck: Neck supple.  Cardiovascular: Normal rate, regular rhythm and intact distal pulses.   Pulmonary/Chest: Effort normal and breath sounds normal. No respiratory distress.  No rash. No crepitus.   Abdominal: Soft. Bowel sounds are normal. She exhibits no distension. There is no tenderness.  Musculoskeletal: Normal range of motion.  Mild tenderness over bilateral knees. No significant effusions. No deformities. Distal neurovascular intact. Gait intact. Calves nontender. Symmetric minimal pretibial edema.  Neurological: She is alert and oriented to person, place, and time.  Skin: Skin is warm and dry.    ED Course  Procedures (including critical care time) Labs Review Labs Reviewed  CBC - Abnormal; Notable for the following:    RBC 3.85 (*)    Hemoglobin 10.9 (*)    HCT 33.2 (*)    All other components within normal limits  BASIC METABOLIC PANEL - Abnormal; Notable for the following:    GFR calc non Af Amer 89 (*)    All other components within normal limits  POCT I-STAT TROPONIN I  POCT I-STAT TROPONIN I   Imaging Review Dg Chest Portable 1 View  01/03/2014   CLINICAL DATA:  Chest pain.  EXAM: PORTABLE CHEST - 1 VIEW  COMPARISON:  05/30/2013  FINDINGS: Generous heart size, stable from prior. Unremarkable upper mediastinal contours. No infiltrate or edema. No effusion or pneumothorax.  IMPRESSION: No active disease.   Electronically Signed   By: Jorje Guild M.D.   On: 01/03/2014 02:20    EKG Interpretation    Date/Time:  Sunday January 02 2014 20:34:55 EST Ventricular Rate:  66 PR Interval:  150 QRS Duration: 86 QT Interval:  390 QTC Calculation: 408 R Axis:   43 Text Interpretation:  Normal sinus rhythm  Nonspecific ST and T wave abnormality No significant change since last tracing Confirmed by Jabarie Pop  MD, Joshua Zeringue [id number]) on 01/03/2014 1:52:32 AM           Aspirin provided. She declines any other pain medications.  4:21 AM remains chest pain-free in the ER. Patient is comfortable to plan discharge home.  She will followup with her primary care physician. She'll continue medications as prescribed. She will followup with pain management as scheduled. Chest pain precautions provided and verbalized as understood. Referral outpatient stress testing provided. MDM  Diagnosis: Chest pain, bilateral knee pain history of arthritis, history of chronic pain  EKG, chest x-ray, labs including serial troponins all reviewed as above. Medications provided Vital signs and nurses notes reviewed and considered.  Teressa Lower, MD 01/03/14 [phone]

## 2014-01-17 ENCOUNTER — Encounter: Payer: Self-pay | Admitting: Cardiovascular Disease

## 2014-01-17 DIAGNOSIS — E785 Hyperlipidemia, unspecified: Secondary | ICD-10-CM | POA: Insufficient documentation

## 2014-01-17 DIAGNOSIS — R102 Pelvic and perineal pain unspecified side: Secondary | ICD-10-CM | POA: Insufficient documentation

## 2014-01-17 DIAGNOSIS — E119 Type 2 diabetes mellitus without complications: Secondary | ICD-10-CM | POA: Insufficient documentation

## 2014-01-17 DIAGNOSIS — D509 Iron deficiency anemia, unspecified: Secondary | ICD-10-CM | POA: Insufficient documentation

## 2014-01-17 DIAGNOSIS — R351 Nocturia: Secondary | ICD-10-CM | POA: Insufficient documentation

## 2014-01-17 DIAGNOSIS — R7611 Nonspecific reaction to tuberculin skin test without active tuberculosis: Secondary | ICD-10-CM | POA: Insufficient documentation

## 2014-01-17 DIAGNOSIS — Z8669 Personal history of other diseases of the nervous system and sense organs: Secondary | ICD-10-CM | POA: Insufficient documentation

## 2014-01-17 DIAGNOSIS — D259 Leiomyoma of uterus, unspecified: Secondary | ICD-10-CM | POA: Insufficient documentation

## 2014-01-17 DIAGNOSIS — J449 Chronic obstructive pulmonary disease, unspecified: Secondary | ICD-10-CM | POA: Insufficient documentation

## 2014-01-17 DIAGNOSIS — D869 Sarcoidosis, unspecified: Secondary | ICD-10-CM | POA: Insufficient documentation

## 2014-01-17 DIAGNOSIS — N393 Stress incontinence (female) (male): Secondary | ICD-10-CM | POA: Insufficient documentation

## 2014-01-17 DIAGNOSIS — Z8719 Personal history of other diseases of the digestive system: Secondary | ICD-10-CM | POA: Insufficient documentation

## 2014-01-17 DIAGNOSIS — M1990 Unspecified osteoarthritis, unspecified site: Secondary | ICD-10-CM | POA: Insufficient documentation

## 2014-01-17 DIAGNOSIS — M797 Fibromyalgia: Secondary | ICD-10-CM | POA: Insufficient documentation

## 2014-01-17 DIAGNOSIS — N189 Chronic kidney disease, unspecified: Secondary | ICD-10-CM | POA: Insufficient documentation

## 2014-01-18 ENCOUNTER — Ambulatory Visit (INDEPENDENT_AMBULATORY_CARE_PROVIDER_SITE_OTHER): Payer: Medicare Other | Admitting: Cardiovascular Disease

## 2014-01-18 ENCOUNTER — Encounter: Payer: Self-pay | Admitting: Cardiovascular Disease

## 2014-01-18 VITALS — BP 145/91 | HR 73 | Ht 69.0 in | Wt 260.0 lb

## 2014-01-18 DIAGNOSIS — M797 Fibromyalgia: Secondary | ICD-10-CM

## 2014-01-18 DIAGNOSIS — R079 Chest pain, unspecified: Secondary | ICD-10-CM

## 2014-01-18 DIAGNOSIS — R9431 Abnormal electrocardiogram [ECG] [EKG]: Secondary | ICD-10-CM

## 2014-01-18 DIAGNOSIS — IMO0001 Reserved for inherently not codable concepts without codable children: Secondary | ICD-10-CM

## 2014-01-18 NOTE — Progress Notes (Signed)
Patient ID: [name], female   DOB: 04-10-1951, 63 y.o.   MRN: [id number] 63 yo referred by ER 01/03/13 seen for chest pain.  Last seen by Dr Angelena Form for chest pian in 2009  Normal cath  History provided by patient. Has history of chronic pain, hypertension, COPD/bronchitis. Today at home around 12 PM noon developed substernal chest pain described as tightness and sharp and stabbing pains. This lasted about an hour and resolved. No shortness of breath. No wheezing. No fevers or chills. No cough. Tonight before going to bed patient not feeling well, describes ongoing bilateral knee pain with history of arthritis presents here for evaluation. She declines any pain medications. She denies any unilateral swelling or pain. No trauma. Symptoms moderate in severity.  In ER R/O CXR ok and ECG with nonspecific changes.  Still having intermitant central pains not always worse with exertion Seems to have some drug intolerances to ACE and aldactone ? sulfer drugs.     ROS: Denies fever, malais, weight loss, blurry vision, decreased visual acuity, cough, sputum, SOB, hemoptysis, pleuritic pain, palpitaitons, heartburn, abdominal pain, melena, lower extremity edema, claudication, or rash.  All other systems reviewed and negative   General: Affect appropriate Overweight black female  HEENT: normal Neck supple with no adenopathy JVP normal no bruits no thyromegaly Lungs clear with no wheezing and good diaphragmatic motion Heart:  S1/S2 no murmur,rub, gallop or click PMI normal Abdomen: benighn, BS positve, no tenderness, no AAA no bruit.  No HSM or HJR Distal pulses intact with no bruits No edema Neuro non-focal Skin warm and dry No muscular weakness  Medications Current Outpatient Prescriptions  Medication Sig Dispense Refill  . acetaminophen (TYLENOL) 650 MG CR tablet Take 650 mg by mouth every 8 (eight) hours as needed for pain. pain      . albuterol (PROVENTIL HFA;VENTOLIN HFA) 108 (90 BASE)  MCG/ACT inhaler Inhale 2 puffs into the lungs every 6 (six) hours as needed for wheezing or shortness of breath. Shortness of breath      . aspirin-acetaminophen-caffeine (EXCEDRIN EXTRA STRENGTH) 250-250-65 MG per tablet Take 2 tablets by mouth every 6 (six) hours as needed for pain.      Marland Kitchen atenolol (TENORMIN) 50 MG tablet Take 75 mg by mouth daily.      . beclomethasone (QVAR) 80 MCG/ACT inhaler Inhale 2 puffs into the lungs 2 (two) times daily.      . cetirizine (ZYRTEC) 10 MG tablet Take 10 mg by mouth daily.      . Cholecalciferol (VITAMIN D) 2000 UNITS CAPS Take 2,000 Units by mouth daily.      Marland Kitchen esomeprazole (NEXIUM) 40 MG capsule Take 40 mg by mouth daily before breakfast.      . Ferrous Gluconate (IRON) 240 (27 FE) MG TABS Take 240 mg by mouth daily.       . fluticasone (FLONASE) 50 MCG/ACT nasal spray Place 2 sprays into the nose as needed for allergies. congestion      . furosemide (LASIX) 40 MG tablet Take 40 mg by mouth daily.       Marland Kitchen gabapentin (NEURONTIN) 600 MG tablet Take 600 mg by mouth 3 (three) times daily.      Marland Kitchen glipiZIDE (GLUCOTROL XL) 10 MG 24 hr tablet Take 10 mg by mouth daily. Before main meal      . ibuprofen (ADVIL,MOTRIN) 600 MG tablet Take 600 mg by mouth every 6 (six) hours as needed for headache.      Marland Kitchen  metFORMIN (GLUCOPHAGE) 850 MG tablet Take 850 mg by mouth 2 (two) times daily with a meal.      . methocarbamol (ROBAXIN) 750 MG tablet Take 750 mg by mouth every 8 (eight) hours.       Marland Kitchen oxycodone (ROXICODONE) 30 MG immediate release tablet Take 30 mg by mouth every 4 (four) hours as needed for pain. For pain      . simvastatin (ZOCOR) 40 MG tablet Take 40 mg by mouth every evening.      . triamterene-hydrochlorothiazide (MAXZIDE-25) 37.5-25 MG per tablet Take 1 tablet by mouth daily.        No current facility-administered medications for this visit.    Allergies Avapro; Lisinopril; and Latex  Family History: No family history on file.  Social  History: History   Social History  . Marital Status: Married    Spouse Name: N/A    Number of Children: N/A  . Years of Education: N/A   Occupational History  . Not on file.   Social History Main Topics  . Smoking status: Former Smoker -- 1.00 packs/day for 27 years    Types: Cigarettes    Quit date: 12/06/1991  . Smokeless tobacco: Never Used  . Alcohol Use: No  . Drug Use: No  . Sexual Activity:    Other Topics Concern  . Not on file   Social History Narrative  . No narrative on file    Electrocardiogram:  Assessment and Plan

## 2014-01-18 NOTE — Assessment & Plan Note (Signed)
More likely etiology of pain  F/U Dr Jonelle Sidle  Consider antiserotanergic drugs

## 2014-01-18 NOTE — Patient Instructions (Addendum)
Your physician has requested that you have en exercise stress myoview. For further information please visit [email or web address]. Please follow instruction sheet, as given.    Your physician recommends that you schedule a follow-up appointment as needed.

## 2014-01-18 NOTE — Assessment & Plan Note (Signed)
Atypical but persistant  Diabetic with normal cath in 2009.  Nonspecific ECG changes   F/U stress myovue

## 2014-01-18 NOTE — Assessment & Plan Note (Signed)
Discussed low carb diet.  Target hemoglobin A1c is 6.5 or less.  Continue current medications.  

## 2014-02-07 ENCOUNTER — Ambulatory Visit (HOSPITAL_COMMUNITY): Payer: Medicare Other | Attending: Cardiovascular Disease | Admitting: Radiology

## 2014-02-07 ENCOUNTER — Encounter: Payer: Self-pay | Admitting: Cardiovascular Disease

## 2014-02-07 VITALS — BP 146/100 | HR 69 | Ht 69.0 in | Wt 260.0 lb

## 2014-02-07 DIAGNOSIS — E119 Type 2 diabetes mellitus without complications: Secondary | ICD-10-CM | POA: Insufficient documentation

## 2014-02-07 DIAGNOSIS — E785 Hyperlipidemia, unspecified: Secondary | ICD-10-CM | POA: Insufficient documentation

## 2014-02-07 DIAGNOSIS — I10 Essential (primary) hypertension: Secondary | ICD-10-CM | POA: Insufficient documentation

## 2014-02-07 DIAGNOSIS — Z8249 Family history of ischemic heart disease and other diseases of the circulatory system: Secondary | ICD-10-CM | POA: Insufficient documentation

## 2014-02-07 DIAGNOSIS — J45909 Unspecified asthma, uncomplicated: Secondary | ICD-10-CM | POA: Insufficient documentation

## 2014-02-07 DIAGNOSIS — D869 Sarcoidosis, unspecified: Secondary | ICD-10-CM | POA: Insufficient documentation

## 2014-02-07 DIAGNOSIS — Z87891 Personal history of nicotine dependence: Secondary | ICD-10-CM | POA: Insufficient documentation

## 2014-02-07 DIAGNOSIS — R9431 Abnormal electrocardiogram [ECG] [EKG]: Secondary | ICD-10-CM | POA: Insufficient documentation

## 2014-02-07 DIAGNOSIS — R079 Chest pain, unspecified: Secondary | ICD-10-CM | POA: Insufficient documentation

## 2014-02-07 MED ORDER — TECHNETIUM TC 99M SESTAMIBI GENERIC - CARDIOLITE
33.0000 | Freq: Once | INTRAVENOUS | Status: AC | PRN
  Administered 2014-02-07: 33 via INTRAVENOUS

## 2014-02-07 MED ORDER — REGADENOSON 0.4 MG/5ML IV SOLN
0.4000 mg | Freq: Once | INTRAVENOUS | Status: AC
  Administered 2014-02-07: 0.4 mg via INTRAVENOUS

## 2014-02-07 NOTE — Progress Notes (Signed)
[facility] [address] [place], [place] [address] [phone]    Cardiology Nuclear Med Study  [name] is a 63 y.o. female     MRN : [id number]     DOB: [birth year]  Procedure Date: 02/07/2014  Nuclear Med Background Indication for Stress Test:  Evaluation for Ischemia, [facility] and Abnormal EKG History:  No known CAD, Cath (normal), Asthma, Sarcoidosis Cardiac Risk Factors: Family History - CAD, History of Smoking, Hypertension, Lipids and NIDDM  Symptoms:  Chest Pain   Nuclear Pre-Procedure Caffeine/Decaff Intake:  None NPO After: 1:00 pm   Lungs:  clear O2 Sat: 94% on room air. IV 0.9% NS with Angio Cath:  22g  IV Site: R Hand  IV Started by:  Crissie Figures, RN  Chest Size (in):  38 Cup Size: C  Height:    Weight:     BMI:  Body mass index is 38.38 kg/(m^2). Tech Comments:  N/A    Nuclear Med Study 1 or 2 day study: 2 day  Stress Test Type:  Lexiscan  Reading MD: N/A  Order Authorizing Provider:  Jenkins Rouge, MD  Resting Radionuclide: Technetium 11m Sestamibi  Resting Radionuclide Dose: 33.0 mCi ON 02/09/14   Stress Radionuclide:  Technetium 67m Sestamibi  Stress Radionuclide Dose: 33.0 mCi ON 02/07/14           Stress Protocol Rest HR: 69 Stress HR: 85  Rest BP: 146/100 Stress BP: 157/103  Exercise Time (min): n/a METS: n/a   Predicted Max HR: 159 bpm % Max HR: 53.46 bpm Rate Pressure Product: 13345   Dose of Adenosine (mg):  n/a Dose of Lexiscan: 0.4 mg  Dose of Atropine (mg): n/a Dose of Dobutamine: n/a mcg/kg/min (at max HR)  Stress Test Technologist: Glade Lloyd, BS-ES  Nuclear Technologist:  Charlton Amor, CNMT     Rest Procedure:  Myocardial perfusion imaging was performed at rest 45 minutes following the intravenous administration of Technetium 68m Sestamibi. Rest ECG: NSR with T wave inversion V3-4.   Stress Procedure:  The patient received IV Lexiscan 0.4 mg over 15-seconds.  Technetium 69m Sestamibi  injected at 30-seconds.  Quantitative spect images were obtained after a 45 minute delay.  During the infusion of Lexiscan, the patient complained of feeling woozy, SOB and a headache.  These began to resolve in recovery.  Stress ECG: No significant change from baseline ECG  QPS Raw Data Images:  Normal; no motion artifact; normal heart/lung ratio. Stress Images:  There is subtle decreased uptake along apex seen at both rest and stress.  Rest Images:  There is subtle decreased uptake along apex seen at both rest and stress.  Subtraction (SDS):  No evidence of ischemia. Transient Ischemic Dilatation (Normal <1.22):  1.12 Lung/Heart Ratio (Normal <0.45):  0.30  Quantitative Gated Spect Images QGS EDV:  108 ml QGS ESV:  35 ml  Impression Exercise Capacity:  Lexiscan with no exercise. BP Response:  Normal blood pressure response. Clinical Symptoms:  No significant symptoms noted. ECG Impression:  No significant ST segment change suggestive of ischemia. Comparison with Prior Nuclear Study: No images to compare  Overall Impression:  Low risk stress nuclear study with no ischemia. .  LV Ejection Fraction: 68%.  LV Wall Motion:  NL LV Function; NL Wall Motion. Normal cath 2009.    Candee Furbish, MD

## 2014-02-08 ENCOUNTER — Encounter (HOSPITAL_COMMUNITY): Payer: Medicare Other

## 2014-02-09 ENCOUNTER — Ambulatory Visit (HOSPITAL_COMMUNITY): Payer: Medicare Other | Attending: Internal Medicine

## 2014-02-09 DIAGNOSIS — R0989 Other specified symptoms and signs involving the circulatory and respiratory systems: Secondary | ICD-10-CM

## 2014-02-09 MED ORDER — TECHNETIUM TC 99M SESTAMIBI GENERIC - CARDIOLITE
33.0000 | Freq: Once | INTRAVENOUS | Status: AC | PRN
  Administered 2014-02-09: 33 via INTRAVENOUS

## 2014-06-03 ENCOUNTER — Other Ambulatory Visit (HOSPITAL_COMMUNITY): Payer: Self-pay | Admitting: Internal Medicine

## 2014-06-03 DIAGNOSIS — Z1231 Encounter for screening mammogram for malignant neoplasm of breast: Secondary | ICD-10-CM

## 2014-06-17 ENCOUNTER — Ambulatory Visit (HOSPITAL_COMMUNITY)
Admission: RE | Admit: 2014-06-17 | Discharge: 2014-06-17 | Disposition: A | Discharge status: Home / Self Care | Payer: Medicare Other | Source: Ambulatory Visit | Attending: Internal Medicine | Admitting: Internal Medicine

## 2014-06-17 DIAGNOSIS — Z1231 Encounter for screening mammogram for malignant neoplasm of breast: Secondary | ICD-10-CM | POA: Insufficient documentation

## 2014-07-18 ENCOUNTER — Encounter: Payer: Self-pay | Admitting: Obstetrics & Gynecology

## 2014-07-18 ENCOUNTER — Ambulatory Visit (INDEPENDENT_AMBULATORY_CARE_PROVIDER_SITE_OTHER): Payer: Medicare Other | Admitting: Obstetrics & Gynecology

## 2014-07-18 VITALS — BP 138/82 | HR 70 | Temp 98.6°F | Ht 69.0 in | Wt 253.1 lb

## 2014-07-18 DIAGNOSIS — R102 Pelvic and perineal pain: Principal | ICD-10-CM

## 2014-07-18 DIAGNOSIS — G8929 Other chronic pain: Secondary | ICD-10-CM | POA: Diagnosis not present

## 2014-07-18 DIAGNOSIS — N949 Unspecified condition associated with female genital organs and menstrual cycle: Secondary | ICD-10-CM

## 2014-07-18 NOTE — Progress Notes (Signed)
Subjective:     Patient ID: [name], female   DOB: June 24, 1951, 63 y.o.   MRN: [id number]  HPI Pt s/p RATH 12/2011 byt Dr. Landry Mellow.  The surgery was due to pelvic pain.  Pt reports that the pain has returned.  It went away until 3 months ago.  The pain is usually at night.  She is not sure if the pain is related to Fibromyalgia or sarcoid or DM.  She is already in a pain medicine clinic Dr. Angie Fava. Pain improves WITH activity.  Pain is relieved by pain meds. Pt not sure if pain awakens her a t night as she wakes up q 2 hours to void.  Pt reports incontinence that developed after seeing a urologist and getting instillation of 'some medicine' she was seen by [facility], Dr. Ardyth Gal.  Pt did not f/u re the incontinence.  Pain NOT worse if no BM.  Pt reports freq BM at least once q 2-3 days.  Pt is sexually active. Pain is NOT worse with intercourse.  Pain is not worse after eating.      Past Medical History  Diagnosis Date  . Hypertension   . GERD (gastroesophageal reflux disease)   . Depression   . History of cardiac arrhythmia STATES NO ISSUES  FOR A LONG TIME    ATENOLOL CONTROLS PALPITATIONS  . Non-ischemic cardiomyopathy ECHO IN 2006 W/ CHART    CARDIAC CATH 2009  W/ CHART  . History of echocardiogram 05-24-2005   NORMAL LVSF ,  EF  50-60%  . Sarcoidosis   . Positive TB test   . Chronic bronchitis   . History of Bell's palsy 2002--  LEFT SIDE-- NO RESIDUAL   . Pelvic pain in female   . Frequency of urination   . Urgency of urination   . Stress incontinence, female   . Nocturia   . Chronic pain syndrome   . History of glaucoma STATES TREATED FOR ABOUT 5 YRS  AGO  --- NO ISSUES SINCE  . Uterine fibroid   . Ankle edema BILATERAL  . Hyperlipidemia   . COPD (chronic obstructive pulmonary disease) CHRONIC BRONCHITIS --  LAST BOUT  BILATERAL PNEUMONIA 2009  . Iron deficiency anemia   . H/O hiatal hernia   . Fibromyalgia   . Arthritis     BACK, KNEE, HIPS  . Chronic renal  insufficiency   . Diabetes mellitus without complication    Past Surgical History  Procedure Laterality Date  . Cardiac catheterization  09-09-2008    NORMAL CORONARY ARTERIES/ NORMAL LVSF/ CHRONIC RENAL INSUFF.  . Bilateral benign breast tumor removed  YRS AGO  . Tubal ligation  YRS AGO  . Cysto with hydrodistension  01/07/2012    Procedure: CYSTOSCOPY/HYDRODISTENSION;  Surgeon: Reece Packer, MD;  Location: [facility];  Service: Urology;  Laterality: N/A;   OF BLADDER WITH INSTILLATION OF MARCAINE AND PRYDIUM  . Neck cyst       Review of Systems     Objective:   Physical Exam BP 138/82  Pulse 70  Temp(Src) 98.6 F (37 C) (Oral)  Ht 5\' 9"  (1.753 m)  Wt 253 lb 1.6 oz (114.805 kg)  BMI 37.36 kg/m2 Pt in NAD Lungs: CTA CV: RRR Abd: soft, NT, ND well healed port site GU: EGBUS: no lesions Vagina: no blood in vault Cervix/ Uterus: surgically absent Adnexa: no masses; non tender        Assessment:     Incontinence and frequent  urinary Chronic pain-  doubt related to pelvic pathology.  WIll obtain sono to eval   Plan:     F/u with [facility] abd/Pelvic sono

## 2014-07-18 NOTE — Patient Instructions (Addendum)
Pelvic Pain Female pelvic pain can be caused by many different things and start from a variety of places. Pelvic pain refers to pain that is located in the lower half of the abdomen and between your hips. The pain may occur over a short period of time (acute) or may be reoccurring (chronic). The cause of pelvic pain may be related to disorders affecting the female reproductive organs (gynecologic), but it may also be related to the bladder, kidney stones, an intestinal complication, or muscle or skeletal problems. Getting help right away for pelvic pain is important, especially if there has been severe, sharp, or a sudden onset of unusual pain. It is also important to get help right away because some types of pelvic pain can be life threatening.  CAUSES  Below are only some of the causes of pelvic pain. The causes of pelvic pain can be in one of several categories.   Gynecologic.  Pelvic inflammatory disease.  Sexually transmitted infection.  Ovarian cyst or a twisted ovarian ligament (ovarian torsion).  Uterine lining that grows outside the uterus (endometriosis).  Fibroids, cysts, or tumors.  Ovulation.  Pregnancy.  Pregnancy that occurs outside the uterus (ectopic pregnancy).  Miscarriage.  Labor.  Abruption of the placenta or ruptured uterus.  Infection.  Uterine infection (endometritis).  Bladder infection.  Diverticulitis.  Miscarriage related to a uterine infection (septic abortion).  Bladder.  Inflammation of the bladder (cystitis).  Kidney stone(s).  Gastrointestinal.  Constipation.  Diverticulitis.  Neurologic.  Trauma.  Feeling pelvic pain because of mental or emotional causes (psychosomatic).  Cancers of the bowel or pelvis. EVALUATION  Your caregiver will want to take a careful history of your concerns. This includes recent changes in your health, a careful gynecologic history of your periods (menses), and a sexual history. Obtaining your family  history and medical history is also important. Your caregiver may suggest a pelvic exam. A pelvic exam will help identify the location and severity of the pain. It also helps in the evaluation of which organ system may be involved. In order to identify the cause of the pelvic pain and be properly treated, your caregiver may order tests. These tests may include:   A pregnancy test.  Pelvic ultrasonography.  An X-ray exam of the abdomen.  A urinalysis or evaluation of vaginal discharge.  Blood tests. HOME CARE INSTRUCTIONS   Only take over-the-counter or prescription medicines for pain, discomfort, or fever as directed by your caregiver.   Rest as directed by your caregiver.   Eat a balanced diet.   Drink enough fluids to make your urine clear or pale yellow, or as directed.   Avoid sexual intercourse if it causes pain.   Apply warm or cold compresses to the lower abdomen depending on which one helps the pain.   Avoid stressful situations.   Keep a journal of your pelvic pain. Write down when it started, where the pain is located, and if there are things that seem to be associated with the pain, such as food or your menstrual cycle.  Follow up with your caregiver as directed.  SEEK MEDICAL CARE IF:  Your medicine does not help your pain.  You have abnormal vaginal discharge. SEEK IMMEDIATE MEDICAL CARE IF:   You have heavy bleeding from the vagina.   Your pelvic pain increases.   You feel light-headed or faint.   You have chills.   You have pain with urination or blood in your urine.   You have uncontrolled diarrhea   or vomiting.   You have a fever or persistent symptoms for more than 3 days.  You have a fever and your symptoms suddenly get worse.   You are being physically or sexually abused.  MAKE SURE YOU:  Understand these instructions.  Will watch your condition.  Will get help if you are not doing well or get worse. Document Released:  11/05/2004 Document Revised: 04/25/2014 Document Reviewed: 03/30/2012 [facility] Patient Information 2015 [place], [place]. This information is not intended to replace advice given to you by your health care provider. Make sure you discuss any questions you have with your health care provider.   Dr. Maryland Pink- urogynecologist

## 2014-07-25 ENCOUNTER — Ambulatory Visit (HOSPITAL_COMMUNITY)
Admission: RE | Admit: 2014-07-25 | Discharge: 2014-07-25 | Disposition: A | Discharge status: Home / Self Care | Payer: Medicare Other | Source: Ambulatory Visit | Attending: Obstetrics & Gynecology | Admitting: Obstetrics & Gynecology

## 2014-07-25 DIAGNOSIS — N949 Unspecified condition associated with female genital organs and menstrual cycle: Secondary | ICD-10-CM | POA: Diagnosis not present

## 2014-07-25 DIAGNOSIS — Z90710 Acquired absence of both cervix and uterus: Secondary | ICD-10-CM | POA: Diagnosis not present

## 2014-07-25 DIAGNOSIS — G8929 Other chronic pain: Secondary | ICD-10-CM

## 2014-07-25 DIAGNOSIS — R102 Pelvic and perineal pain: Secondary | ICD-10-CM

## 2014-07-26 ENCOUNTER — Telehealth: Payer: Self-pay

## 2014-07-26 NOTE — Telephone Encounter (Signed)
Message copied by Geanie Logan on Tue Jul 26, 2014  8:38 AM ------      Message from: Lavonia Drafts      Created: Mon Jul 25, 2014  6:12 PM       Please call pt.  Her pelvic sono was normal.  She should f/u with urology or UroGyn as discussed on her prev visit.            Thx,      clh-S ------

## 2014-07-26 NOTE — Telephone Encounter (Signed)
Called patient and informed of normal U/s and the need to follow up with urology. Patient verbalized understanding. No questions or concerns.

## 2015-03-09 ENCOUNTER — Emergency Department (HOSPITAL_COMMUNITY): Payer: Medicare Other

## 2015-03-09 ENCOUNTER — Encounter (HOSPITAL_COMMUNITY): Payer: Self-pay | Admitting: Emergency Medicine

## 2015-03-09 ENCOUNTER — Emergency Department (HOSPITAL_COMMUNITY)
Admission: EM | Admit: 2015-03-09 | Discharge: 2015-03-09 | Disposition: A | Discharge status: Home / Self Care | Payer: Medicare Other | Attending: Emergency Medicine | Admitting: Emergency Medicine

## 2015-03-09 DIAGNOSIS — Z79899 Other long term (current) drug therapy: Secondary | ICD-10-CM | POA: Insufficient documentation

## 2015-03-09 DIAGNOSIS — G894 Chronic pain syndrome: Secondary | ICD-10-CM | POA: Insufficient documentation

## 2015-03-09 DIAGNOSIS — N189 Chronic kidney disease, unspecified: Secondary | ICD-10-CM | POA: Insufficient documentation

## 2015-03-09 DIAGNOSIS — E119 Type 2 diabetes mellitus without complications: Secondary | ICD-10-CM | POA: Diagnosis not present

## 2015-03-09 DIAGNOSIS — Z87891 Personal history of nicotine dependence: Secondary | ICD-10-CM | POA: Insufficient documentation

## 2015-03-09 DIAGNOSIS — M159 Polyosteoarthritis, unspecified: Secondary | ICD-10-CM | POA: Diagnosis not present

## 2015-03-09 DIAGNOSIS — M6281 Muscle weakness (generalized): Secondary | ICD-10-CM | POA: Insufficient documentation

## 2015-03-09 DIAGNOSIS — M797 Fibromyalgia: Secondary | ICD-10-CM | POA: Diagnosis not present

## 2015-03-09 DIAGNOSIS — Z8611 Personal history of tuberculosis: Secondary | ICD-10-CM | POA: Diagnosis not present

## 2015-03-09 DIAGNOSIS — K219 Gastro-esophageal reflux disease without esophagitis: Secondary | ICD-10-CM | POA: Diagnosis not present

## 2015-03-09 DIAGNOSIS — Z7951 Long term (current) use of inhaled steroids: Secondary | ICD-10-CM | POA: Insufficient documentation

## 2015-03-09 DIAGNOSIS — I129 Hypertensive chronic kidney disease with stage 1 through stage 4 chronic kidney disease, or unspecified chronic kidney disease: Secondary | ICD-10-CM | POA: Diagnosis not present

## 2015-03-09 DIAGNOSIS — D509 Iron deficiency anemia, unspecified: Secondary | ICD-10-CM | POA: Insufficient documentation

## 2015-03-09 DIAGNOSIS — R531 Weakness: Secondary | ICD-10-CM

## 2015-03-09 DIAGNOSIS — Z9889 Other specified postprocedural states: Secondary | ICD-10-CM | POA: Diagnosis not present

## 2015-03-09 DIAGNOSIS — Z91040 Latex allergy status: Secondary | ICD-10-CM | POA: Insufficient documentation

## 2015-03-09 DIAGNOSIS — E785 Hyperlipidemia, unspecified: Secondary | ICD-10-CM | POA: Insufficient documentation

## 2015-03-09 DIAGNOSIS — Z8669 Personal history of other diseases of the nervous system and sense organs: Secondary | ICD-10-CM | POA: Insufficient documentation

## 2015-03-09 LAB — URINALYSIS, ROUTINE W REFLEX MICROSCOPIC
Bilirubin Urine: NEGATIVE
Glucose, UA: NEGATIVE mg/dL
Hgb urine dipstick: NEGATIVE
Ketones, ur: NEGATIVE mg/dL
Nitrite: NEGATIVE
Protein, ur: NEGATIVE mg/dL
Specific Gravity, Urine: 1.019 (ref 1.005–1.030)
Urobilinogen, UA: 1 mg/dL (ref 0.0–1.0)
pH: 6 (ref 5.0–8.0)

## 2015-03-09 LAB — CBC WITH DIFFERENTIAL/PLATELET
Basophils Absolute: 0 10*3/uL (ref 0.0–0.1)
Basophils Relative: 0 % (ref 0–1)
Eosinophils Absolute: 0.1 10*3/uL (ref 0.0–0.7)
Eosinophils Relative: 2 % (ref 0–5)
HCT: 34.6 % — ABNORMAL LOW (ref 36.0–46.0)
Hemoglobin: 11.2 g/dL — ABNORMAL LOW (ref 12.0–15.0)
Lymphocytes Relative: 36 % (ref 12–46)
Lymphs Abs: 2.6 10*3/uL (ref 0.7–4.0)
MCH: 28.3 pg (ref 26.0–34.0)
MCHC: 32.4 g/dL (ref 30.0–36.0)
MCV: 87.4 fL (ref 78.0–100.0)
Monocytes Absolute: 0.5 10*3/uL (ref 0.1–1.0)
Monocytes Relative: 7 % (ref 3–12)
Neutro Abs: 4 10*3/uL (ref 1.7–7.7)
Neutrophils Relative %: 55 % (ref 43–77)
Platelets: 220 10*3/uL (ref 150–400)
RBC: 3.96 MIL/uL (ref 3.87–5.11)
RDW: 15.4 % (ref 11.5–15.5)
WBC: 7.2 10*3/uL (ref 4.0–10.5)

## 2015-03-09 LAB — COMPREHENSIVE METABOLIC PANEL
ALT: 22 U/L (ref 0–35)
AST: 23 U/L (ref 0–37)
Albumin: 3.8 g/dL (ref 3.5–5.2)
Alkaline Phosphatase: 66 U/L (ref 39–117)
Anion gap: 6 (ref 5–15)
BUN: 20 mg/dL (ref 6–23)
CO2: 30 mmol/L (ref 19–32)
Calcium: 9.2 mg/dL (ref 8.4–10.5)
Chloride: 106 mmol/L (ref 96–112)
Creatinine, Ser: 0.92 mg/dL (ref 0.50–1.10)
GFR calc Af Amer: 75 mL/min — ABNORMAL LOW (ref >90)
GFR calc non Af Amer: 65 mL/min — ABNORMAL LOW (ref >90)
Glucose, Bld: 98 mg/dL (ref 70–99)
Potassium: 3.8 mmol/L (ref 3.5–5.1)
Sodium: 142 mmol/L (ref 135–145)
Total Bilirubin: 0.4 mg/dL (ref 0.3–1.2)
Total Protein: 7.2 g/dL (ref 6.0–8.3)

## 2015-03-09 LAB — CBG MONITORING, ED: GLUCOSE-CAPILLARY: 101 mg/dL — AB (ref 70–99)

## 2015-03-09 LAB — I-STAT CHEM 8, ED
BUN: 18 mg/dL (ref 6–23)
CALCIUM ION: 1.14 mmol/L (ref 1.13–1.30)
CHLORIDE: 103 mmol/L (ref 96–112)
Creatinine, Ser: 0.8 mg/dL (ref 0.50–1.10)
GLUCOSE: 130 mg/dL — AB (ref 70–99)
HCT: 36 % (ref 36.0–46.0)
Hemoglobin: 12.2 g/dL (ref 12.0–15.0)
Potassium: 3.8 mmol/L (ref 3.5–5.1)
SODIUM: 141 mmol/L (ref 135–145)
TCO2: 23 mmol/L (ref 0–100)

## 2015-03-09 LAB — BRAIN NATRIURETIC PEPTIDE: B Natriuretic Peptide: 89.6 pg/mL (ref 0.0–100.0)

## 2015-03-09 LAB — URINE MICROSCOPIC-ADD ON

## 2015-03-09 NOTE — ED Notes (Signed)
PA-C student at bedside. 

## 2015-03-09 NOTE — Discharge Instructions (Signed)
Return here as needed. Follow up with your doctor. °

## 2015-03-09 NOTE — ED Notes (Signed)
Pt c/o weakness and swelling in arms and legs, dizziness, decrease in energy, and incontinence of bowel x 2 weeks.

## 2015-03-09 NOTE — ED Notes (Signed)
Pt transported to MRI 

## 2015-03-09 NOTE — ED Provider Notes (Signed)
CSN: [id number]     Arrival date & time 03/09/15  1110 History   First MD Initiated Contact with Patient 03/09/15 1500     No chief complaint on file.    (Consider location/radiation/quality/duration/timing/severity/associated sxs/prior Treatment) HPI Patient presents to the emergency department with weakness and swelling in her arms and legs.  The patient states she is also having dizziness, decrease in energy and noticed that she has had some mild incontinence 2 weeks.  The patient states that her primary doctor sent her to the emergency department.  Patient states that nothing seems make her condition better or worse.  Patient denies chest pain, shortness breath, headache, blurred vision, back pain, neck pain, nausea, vomiting, diarrhea, abdominal pain, rash, or syncope.  The patient states that she did not take any medications prior to arrival Past Medical History  Diagnosis Date  . Hypertension   . GERD (gastroesophageal reflux disease)   . Depression   . History of cardiac arrhythmia STATES NO ISSUES  FOR A LONG TIME    ATENOLOL CONTROLS PALPITATIONS  . Non-ischemic cardiomyopathy ECHO IN 2006 W/ CHART    CARDIAC CATH 2009  W/ CHART  . History of echocardiogram 05-24-2005   NORMAL LVSF ,  EF  50-60%  . Sarcoidosis   . Positive TB test   . Chronic bronchitis   . History of Bell's palsy 2002--  LEFT SIDE-- NO RESIDUAL   . Pelvic pain in female   . Frequency of urination   . Urgency of urination   . Stress incontinence, female   . Nocturia   . Chronic pain syndrome   . History of glaucoma STATES TREATED FOR ABOUT 5 YRS  AGO  --- NO ISSUES SINCE  . Uterine fibroid   . Ankle edema BILATERAL  . Hyperlipidemia   . COPD (chronic obstructive pulmonary disease) CHRONIC BRONCHITIS --  LAST BOUT  BILATERAL PNEUMONIA 2009  . Iron deficiency anemia   . H/O hiatal hernia   . Fibromyalgia   . Arthritis     BACK, KNEE, HIPS  . Chronic renal insufficiency   . Diabetes mellitus without  complication    Past Surgical History  Procedure Laterality Date  . Cardiac catheterization  09-09-2008    NORMAL CORONARY ARTERIES/ NORMAL LVSF/ CHRONIC RENAL INSUFF.  . Bilateral benign breast tumor removed  YRS AGO  . Tubal ligation  YRS AGO  . Cysto with hydrodistension  01/07/2012    Procedure: CYSTOSCOPY/HYDRODISTENSION;  Surgeon: Reece Packer, MD;  Location: [facility];  Service: Urology;  Laterality: N/A;   OF BLADDER WITH INSTILLATION OF MARCAINE AND PRYDIUM  . Neck cyst     History reviewed. No pertinent family history. History  Substance Use Topics  . Smoking status: Former Smoker -- 1.00 packs/day for 27 years    Types: Cigarettes    Quit date: 12/06/1991  . Smokeless tobacco: Never Used  . Alcohol Use: No   OB History    No data available     Review of Systems   All other systems negative except as documented in the HPI. All pertinent positives and negatives as reviewed in the HPI.  Allergies  Avapro; Lisinopril; and Latex  Home Medications   Prior to Admission medications   Medication Sig Start Date End Date Taking? Authorizing Provider  albuterol (PROVENTIL HFA;VENTOLIN HFA) 108 (90 BASE) MCG/ACT inhaler Inhale 2 puffs into the lungs every 6 (six) hours as needed for wheezing or shortness of breath. Shortness of breath  Yes Historical Provider, MD  aspirin-acetaminophen-caffeine (EXCEDRIN EXTRA STRENGTH) [phone] MG per tablet Take 2 tablets by mouth every 6 (six) hours as needed for pain.   Yes Historical Provider, MD  atenolol (TENORMIN) 50 MG tablet Take 75 mg by mouth daily.   Yes Historical Provider, MD  beclomethasone (QVAR) 80 MCG/ACT inhaler Inhale 2 puffs into the lungs 2 (two) times daily.   Yes Historical Provider, MD  cetirizine (ZYRTEC) 10 MG tablet Take 10 mg by mouth daily.   Yes Historical Provider, MD  Cholecalciferol (VITAMIN D) 2000 UNITS CAPS Take 2,000 Units by mouth daily.   Yes Historical Provider, MD   esomeprazole (NEXIUM) 40 MG capsule Take 40 mg by mouth daily before breakfast.   Yes Historical Provider, MD  Ferrous Gluconate (IRON) 240 (27 FE) MG TABS Take 240 mg by mouth daily.    Yes Historical Provider, MD  fluticasone (FLONASE) 50 MCG/ACT nasal spray Place 2 sprays into the nose as needed for allergies. congestion   Yes Historical Provider, MD  gabapentin (NEURONTIN) 600 MG tablet Take 600 mg by mouth 3 (three) times daily.   Yes Historical Provider, MD  glipiZIDE (GLUCOTROL XL) 10 MG 24 hr tablet Take 10 mg by mouth daily. Before main meal   Yes Historical Provider, MD  ibuprofen (ADVIL,MOTRIN) 600 MG tablet Take 600 mg by mouth every 6 (six) hours as needed for headache.   Yes Historical Provider, MD  metFORMIN (GLUCOPHAGE) 850 MG tablet Take 850 mg by mouth 2 (two) times daily with a meal.   Yes Historical Provider, MD  methocarbamol (ROBAXIN) 750 MG tablet Take 750 mg by mouth every 8 (eight) hours.    Yes Historical Provider, MD  oxycodone (ROXICODONE) 30 MG immediate release tablet Take 30 mg by mouth every 4 (four) hours as needed for pain. For pain   Yes Historical Provider, MD  simvastatin (ZOCOR) 40 MG tablet Take 40 mg by mouth every evening.   Yes Historical Provider, MD  triamterene-hydrochlorothiazide (MAXZIDE-25) 37.5-25 MG per tablet Take 1 tablet by mouth daily.  01/13/14  Yes Historical Provider, MD  VOLTAREN 1 % GEL Apply 1 application topically 3 (three) times daily as needed. Pain. Apply to knees, hands, and ankles. 02/10/15  Yes Historical Provider, MD  acetaminophen (TYLENOL) 650 MG CR tablet Take 650 mg by mouth every 8 (eight) hours as needed for pain. pain    Historical Provider, MD  furosemide (LASIX) 40 MG tablet Take 40 mg by mouth daily.     Historical Provider, MD   BP 164/91 mmHg  Pulse 73  Temp(Src) 98.4 F (36.9 C) (Oral)  Resp 16  SpO2 98% Physical Exam  Constitutional: She is oriented to person, place, and time. She appears well-developed and  well-nourished. No distress.  HENT:  Head: Normocephalic and atraumatic.  Mouth/Throat: Oropharynx is clear and moist.  Eyes: EOM are normal. Pupils are equal, round, and reactive to light.  Neck: Normal range of motion. Neck supple.  Cardiovascular: Normal rate, regular rhythm and normal heart sounds.  Exam reveals no gallop and no friction rub.   No murmur heard. Pulmonary/Chest: Effort normal and breath sounds normal. No respiratory distress.  Musculoskeletal: She exhibits no edema.  Neurological: She is alert and oriented to person, place, and time. She has normal reflexes. She exhibits normal muscle tone. Coordination normal.  Skin: Skin is warm and dry. No rash noted. No erythema.  Nursing note and vitals reviewed.   ED Course  Procedures (including critical care  time) Labs Review Labs Reviewed  COMPREHENSIVE METABOLIC PANEL - Abnormal; Notable for the following:    GFR calc non Af Amer 65 (*)    GFR calc Af Amer 75 (*)    All other components within normal limits  CBC WITH DIFFERENTIAL/PLATELET - Abnormal; Notable for the following:    Hemoglobin 11.2 (*)    HCT 34.6 (*)    All other components within normal limits  URINALYSIS, ROUTINE W REFLEX MICROSCOPIC - Abnormal; Notable for the following:    Leukocytes, UA SMALL (*)    All other components within normal limits  URINE MICROSCOPIC-ADD ON - Abnormal; Notable for the following:    Squamous Epithelial / LPF FEW (*)    Bacteria, UA FEW (*)    All other components within normal limits  I-STAT CHEM 8, ED - Abnormal; Notable for the following:    Glucose, Bld 130 (*)    All other components within normal limits  CBG MONITORING, ED - Abnormal; Notable for the following:    Glucose-Capillary 101 (*)    All other components within normal limits  BRAIN NATRIURETIC PEPTIDE    Imaging Review Dg Chest 2 View  03/09/2015   CLINICAL DATA:  Dizziness, weakness.  EXAM: CHEST  2 VIEW  COMPARISON:  January 03, 2014.  FINDINGS:  Stable cardiomediastinal silhouette. No pneumothorax or pleural effusion is noted. Multilevel degenerative disc disease is noted in the mid thoracic spine. No acute pulmonary disease is noted.  IMPRESSION: No active cardiopulmonary disease.   Electronically Signed   By: Marijo Conception, M.D.   On: 03/09/2015 17:46   Mr Thoracic Spine Wo Contrast  03/09/2015   CLINICAL DATA:  Weakness and swelling in extremities, dizziness, fatigue and incontinence for 2 weeks. History of sarcoidosis, Bell's palsy, stress incontinence, fibromyalgia, diabetes, chronic renal insufficiency.  EXAM: MRI THORACIC AND LUMBAR SPINE WITHOUT CONTRAST  TECHNIQUE: Multiplanar and multiecho pulse sequences of the thoracic and lumbar spine were obtained without intravenous contrast.  COMPARISON:  CT of the abdomen and pelvis October 06, 2008  FINDINGS: MR THORACIC SPINE FINDINGS  Thoracic vertebral bodies and posterior elements appear intact and aligned, maintenance of thoracic kyphosis. Mild mid to upper thoracic disc height loss, decreased T2 signal within the mid thoracic vertebral bodies consistent with mild desiccation. Multilevel mild subacute to chronic discogenic endplate changes in the upper to mid thoracic spine without STIR signal abnormality to suggest acute osseous process.  Thoracic spinal cord is normal morphology and signal characteristics of the code level the conus medullaris, partially imaged at L1-2. Mild sub symmetric paraspinal muscle atrophy. Prevertebral soft tissues are nonsuspicious.  At T10-11 is a small broad-based disc bulge. Multilevel mild facet arthropathy without canal stenosis at any thoracic level. Mild T10-11 neural foraminal narrowing bilaterally.  MR LUMBAR SPINE FINDINGS  Lumbar vertebral bodies and posterior elements are intact and aligned with maintenance of lumbar lordosis. Transitional anatomy with partially sacralized L5 vertebral body, transverse process articulates with the sacrum with mild  arthropathy. Transitional anatomy, last congenitally smaller L5-S1 disc, remaining lumbar disc morphology is maintained. Faint STIR signal within the bilateral L4-5 facet likely representing reactive changes. No STIR signal abnormality to suggest fracture.  Conus medullaris terminates at L1-2 and appears normal morphology and signal characteristics. Cauda equina is unremarkable. Mild symmetric paraspinal muscle atrophy. Included prevertebral soft tissues are unremarkable.  Level by level evaluation:  T12-L1, L1-2, L2-3: No significant disc bulge, canal stenosis or neural foraminal narrowing.  L3-4: No disc bulge.  Mild to moderate facet arthropathy and ligamentum flavum redundancy without canal stenosis. No neural foraminal narrowing.  L4-5: Slight annular bulging asymmetric to the RIGHT. Moderate facet arthropathy and ligamentum flavum redundancy with trace RIGHT facet effusion which is likely reactive. No canal stenosis. Mild bilateral neural foraminal narrowing.  L5-S1: Transitional anatomy, no disc bulge, canal stenosis or neural foraminal narrowing.  IMPRESSION: MR THORACIC SPINE IMPRESSION  Mild degenerative change of the thoracic spine without acute fracture or malalignment.  No canal stenosis.  Mild T10-11 neural foraminal narrowing.  MR LUMBAR SPINE IMPRESSION  Degenerative change of lumbar spine, without acute fracture or malalignment. Reactive L4-5 facet arthropathy.  No canal stenosis.  Mild L4-5 neural foraminal narrowing.   Electronically Signed   By: Elon Alas   On: 03/09/2015 22:00   Mr Lumbar Spine Wo Contrast  03/09/2015   CLINICAL DATA:  Weakness and swelling in extremities, dizziness, fatigue and incontinence for 2 weeks. History of sarcoidosis, Bell's palsy, stress incontinence, fibromyalgia, diabetes, chronic renal insufficiency.  EXAM: MRI THORACIC AND LUMBAR SPINE WITHOUT CONTRAST  TECHNIQUE: Multiplanar and multiecho pulse sequences of the thoracic and lumbar spine were obtained  without intravenous contrast.  COMPARISON:  CT of the abdomen and pelvis October 06, 2008  FINDINGS: MR THORACIC SPINE FINDINGS  Thoracic vertebral bodies and posterior elements appear intact and aligned, maintenance of thoracic kyphosis. Mild mid to upper thoracic disc height loss, decreased T2 signal within the mid thoracic vertebral bodies consistent with mild desiccation. Multilevel mild subacute to chronic discogenic endplate changes in the upper to mid thoracic spine without STIR signal abnormality to suggest acute osseous process.  Thoracic spinal cord is normal morphology and signal characteristics of the code level the conus medullaris, partially imaged at L1-2. Mild sub symmetric paraspinal muscle atrophy. Prevertebral soft tissues are nonsuspicious.  At T10-11 is a small broad-based disc bulge. Multilevel mild facet arthropathy without canal stenosis at any thoracic level. Mild T10-11 neural foraminal narrowing bilaterally.  MR LUMBAR SPINE FINDINGS  Lumbar vertebral bodies and posterior elements are intact and aligned with maintenance of lumbar lordosis. Transitional anatomy with partially sacralized L5 vertebral body, transverse process articulates with the sacrum with mild arthropathy. Transitional anatomy, last congenitally smaller L5-S1 disc, remaining lumbar disc morphology is maintained. Faint STIR signal within the bilateral L4-5 facet likely representing reactive changes. No STIR signal abnormality to suggest fracture.  Conus medullaris terminates at L1-2 and appears normal morphology and signal characteristics. Cauda equina is unremarkable. Mild symmetric paraspinal muscle atrophy. Included prevertebral soft tissues are unremarkable.  Level by level evaluation:  T12-L1, L1-2, L2-3: No significant disc bulge, canal stenosis or neural foraminal narrowing.  L3-4: No disc bulge. Mild to moderate facet arthropathy and ligamentum flavum redundancy without canal stenosis. No neural foraminal narrowing.   L4-5: Slight annular bulging asymmetric to the RIGHT. Moderate facet arthropathy and ligamentum flavum redundancy with trace RIGHT facet effusion which is likely reactive. No canal stenosis. Mild bilateral neural foraminal narrowing.  L5-S1: Transitional anatomy, no disc bulge, canal stenosis or neural foraminal narrowing.  IMPRESSION: MR THORACIC SPINE IMPRESSION  Mild degenerative change of the thoracic spine without acute fracture or malalignment.  No canal stenosis.  Mild T10-11 neural foraminal narrowing.  MR LUMBAR SPINE IMPRESSION  Degenerative change of lumbar spine, without acute fracture or malalignment. Reactive L4-5 facet arthropathy.  No canal stenosis.  Mild L4-5 neural foraminal narrowing.   Electronically Signed   By: Elon Alas   On: 03/09/2015 22:00  EKG Interpretation   Date/Time:  Thursday March 09 2015 11:34:12 EDT Ventricular Rate:  66 PR Interval:  137 QRS Duration: 64 QT Interval:  367 QTC Calculation: 384 R Axis:   63 Text Interpretation:  Sinus rhythm Probable left atrial enlargement  Probable anteroseptal infarct, old Borderline T abnormalities, inferior  leads No significant change since last tracing Confirmed by Mingo Amber  MD,  [place] [phone]) on 03/09/2015 4:07:16 PM      MDM   Final diagnoses:  Weakness   Patient is been reassessed.  3.  She is feeling better at this time.  Her MR results did not show any significant findings.  Patient is advised to return here as needed.  Told to follow-up with her primary care doctor    Dalia Heading, PA-C 03/10/15 0126  Evelina Bucy, MD 03/10/15 [phone]

## 2015-05-02 ENCOUNTER — Ambulatory Visit (HOSPITAL_COMMUNITY)
Admission: RE | Admit: 2015-05-02 | Discharge: 2015-05-02 | Disposition: A | Discharge status: Home / Self Care | Payer: Medicare Other | Source: Ambulatory Visit | Attending: Emergency Medicine | Admitting: Emergency Medicine

## 2015-05-02 ENCOUNTER — Encounter (HOSPITAL_COMMUNITY): Payer: Self-pay | Admitting: *Deleted

## 2015-05-02 ENCOUNTER — Other Ambulatory Visit (HOSPITAL_COMMUNITY): Payer: Self-pay | Admitting: Emergency Medicine

## 2015-05-02 ENCOUNTER — Emergency Department (HOSPITAL_COMMUNITY)
Admission: EM | Admit: 2015-05-02 | Discharge: 2015-05-02 | Disposition: A | Discharge status: Home / Self Care | Payer: Medicare Other | Attending: Emergency Medicine | Admitting: Emergency Medicine

## 2015-05-02 DIAGNOSIS — Z7951 Long term (current) use of inhaled steroids: Secondary | ICD-10-CM | POA: Diagnosis not present

## 2015-05-02 DIAGNOSIS — J449 Chronic obstructive pulmonary disease, unspecified: Secondary | ICD-10-CM | POA: Diagnosis not present

## 2015-05-02 DIAGNOSIS — G894 Chronic pain syndrome: Secondary | ICD-10-CM | POA: Insufficient documentation

## 2015-05-02 DIAGNOSIS — Z87891 Personal history of nicotine dependence: Secondary | ICD-10-CM | POA: Diagnosis not present

## 2015-05-02 DIAGNOSIS — Z91040 Latex allergy status: Secondary | ICD-10-CM | POA: Insufficient documentation

## 2015-05-02 DIAGNOSIS — N189 Chronic kidney disease, unspecified: Secondary | ICD-10-CM | POA: Diagnosis not present

## 2015-05-02 DIAGNOSIS — Z862 Personal history of diseases of the blood and blood-forming organs and certain disorders involving the immune mechanism: Secondary | ICD-10-CM | POA: Insufficient documentation

## 2015-05-02 DIAGNOSIS — Z8742 Personal history of other diseases of the female genital tract: Secondary | ICD-10-CM | POA: Diagnosis not present

## 2015-05-02 DIAGNOSIS — Z8659 Personal history of other mental and behavioral disorders: Secondary | ICD-10-CM | POA: Insufficient documentation

## 2015-05-02 DIAGNOSIS — M7989 Other specified soft tissue disorders: Secondary | ICD-10-CM | POA: Insufficient documentation

## 2015-05-02 DIAGNOSIS — I129 Hypertensive chronic kidney disease with stage 1 through stage 4 chronic kidney disease, or unspecified chronic kidney disease: Secondary | ICD-10-CM | POA: Diagnosis not present

## 2015-05-02 DIAGNOSIS — R52 Pain, unspecified: Secondary | ICD-10-CM

## 2015-05-02 DIAGNOSIS — E119 Type 2 diabetes mellitus without complications: Secondary | ICD-10-CM | POA: Diagnosis not present

## 2015-05-02 DIAGNOSIS — R609 Edema, unspecified: Secondary | ICD-10-CM | POA: Diagnosis not present

## 2015-05-02 DIAGNOSIS — K219 Gastro-esophageal reflux disease without esophagitis: Secondary | ICD-10-CM | POA: Insufficient documentation

## 2015-05-02 DIAGNOSIS — M79604 Pain in right leg: Secondary | ICD-10-CM | POA: Diagnosis not present

## 2015-05-02 DIAGNOSIS — M797 Fibromyalgia: Secondary | ICD-10-CM | POA: Diagnosis not present

## 2015-05-02 DIAGNOSIS — M79605 Pain in left leg: Secondary | ICD-10-CM | POA: Diagnosis not present

## 2015-05-02 DIAGNOSIS — Z79899 Other long term (current) drug therapy: Secondary | ICD-10-CM | POA: Insufficient documentation

## 2015-05-02 DIAGNOSIS — E785 Hyperlipidemia, unspecified: Secondary | ICD-10-CM | POA: Diagnosis not present

## 2015-05-02 DIAGNOSIS — D509 Iron deficiency anemia, unspecified: Secondary | ICD-10-CM | POA: Insufficient documentation

## 2015-05-02 LAB — CBC WITH DIFFERENTIAL/PLATELET
Basophils Absolute: 0 10*3/uL (ref 0.0–0.1)
Basophils Relative: 1 % (ref 0–1)
EOS ABS: 0.1 10*3/uL (ref 0.0–0.7)
Eosinophils Relative: 2 % (ref 0–5)
HCT: 33.7 % — ABNORMAL LOW (ref 36.0–46.0)
Hemoglobin: 11 g/dL — ABNORMAL LOW (ref 12.0–15.0)
Lymphocytes Relative: 47 % — ABNORMAL HIGH (ref 12–46)
Lymphs Abs: 2.9 10*3/uL (ref 0.7–4.0)
MCH: 28.2 pg (ref 26.0–34.0)
MCHC: 32.6 g/dL (ref 30.0–36.0)
MCV: 86.4 fL (ref 78.0–100.0)
MONOS PCT: 7 % (ref 3–12)
Monocytes Absolute: 0.5 10*3/uL (ref 0.1–1.0)
Neutro Abs: 2.7 10*3/uL (ref 1.7–7.7)
Neutrophils Relative %: 43 % (ref 43–77)
Platelets: 215 10*3/uL (ref 150–400)
RBC: 3.9 MIL/uL (ref 3.87–5.11)
RDW: 14.7 % (ref 11.5–15.5)
WBC: 6.1 10*3/uL (ref 4.0–10.5)

## 2015-05-02 LAB — COMPREHENSIVE METABOLIC PANEL
ALT: 22 U/L (ref 14–54)
ANION GAP: 9 (ref 5–15)
AST: 30 U/L (ref 15–41)
Albumin: 3.6 g/dL (ref 3.5–5.0)
Alkaline Phosphatase: 62 U/L (ref 38–126)
BILIRUBIN TOTAL: 0.4 mg/dL (ref 0.3–1.2)
BUN: 11 mg/dL (ref 6–20)
CHLORIDE: 98 mmol/L — AB (ref 101–111)
CO2: 29 mmol/L (ref 22–32)
CREATININE: 0.59 mg/dL (ref 0.44–1.00)
Calcium: 9.3 mg/dL (ref 8.9–10.3)
Glucose, Bld: 102 mg/dL — ABNORMAL HIGH (ref 70–99)
Potassium: 3.5 mmol/L (ref 3.5–5.1)
Sodium: 136 mmol/L (ref 135–145)
Total Protein: 6.6 g/dL (ref 6.5–8.1)

## 2015-05-02 LAB — BRAIN NATRIURETIC PEPTIDE: B Natriuretic Peptide: 178.7 pg/mL — ABNORMAL HIGH (ref 0.0–100.0)

## 2015-05-02 MED ORDER — ENOXAPARIN SODIUM 120 MG/0.8ML ~~LOC~~ SOLN
115.0000 mg | Freq: Once | SUBCUTANEOUS | Status: AC
  Administered 2015-05-02: 115 mg via SUBCUTANEOUS
  Filled 2015-05-02: qty 0.8

## 2015-05-02 NOTE — ED Notes (Signed)
Patient presents via EMS with bilateral leg swelling.  Right leg is more swollen then the left and is more painful then the left

## 2015-05-02 NOTE — Discharge Instructions (Signed)
Edema Ms. Leap, Your blood work does not show a cause for your swelling.  Continue taking your furosemide and see your primary doctor within 3 days for close follow up.  Come back tomorrow to radiology to get an ultrasound of your legs to see if there is a blood clot.  If any symptoms worsen, come back to the ED immediately. Thank you. Edema is an abnormal buildup of fluids. It is more common in your legs and thighs. Painless swelling of the feet and ankles is more likely as a person ages. It also is common in looser skin, like around your eyes. HOME CARE   Keep the affected body part above the level of the heart while lying down.  Do not sit still or stand for a long time.  Do not put anything right under your knees when you lie down.  Do not wear tight clothes on your upper legs.  Exercise your legs to help the puffiness (swelling) go down.  Wear elastic bandages or support stockings as told by your doctor.  A low-salt diet may help lessen the puffiness.  Only take medicine as told by your doctor. GET HELP IF:  Treatment is not working.  You have heart, liver, or kidney disease and notice that your skin looks puffy or shiny.  You have puffiness in your legs that does not get better when you raise your legs.  You have sudden weight gain for no reason. GET HELP RIGHT AWAY IF:   You have shortness of breath or chest pain.  You cannot breathe when you lie down.  You have pain, redness, or warmth in the areas that are puffy.  You have heart, liver, or kidney disease and get edema all of a sudden.  You have a fever and your symptoms get worse all of a sudden. MAKE SURE YOU:   Understand these instructions.  Will watch your condition.  Will get help right away if you are not doing well or get worse. Document Released: 05/27/2008 Document Revised: 12/14/2013 Document Reviewed: 10/01/2013 [facility] Patient Information 2015 [place], [place]. This information is not intended to  replace advice given to you by your health care provider. Make sure you discuss any questions you have with your health care provider.

## 2015-05-02 NOTE — Progress Notes (Signed)
VASCULAR LAB PRELIMINARY  PRELIMINARY  PRELIMINARY  PRELIMINARY  Bilateral lower extremity venous duplex  completed.    Preliminary report:  Bilateral:  No evidence of DVT, superficial thrombosis, or Baker's Cyst.    Luman Holway, RVT 05/02/2015, 2:55 PM

## 2015-05-02 NOTE — ED Notes (Signed)
Discharge instructions given voiced understanding

## 2015-05-02 NOTE — ED Provider Notes (Signed)
CSN: [id number]     Arrival date & time 05/02/15  0156 History  This chart was scribed for Everlene Balls, MD by Chester Holstein, ED Scribe. This patient was seen in room B18C/B18C and the patient's care was started at 2:52 AM.    Chief Complaint  Patient presents with  . Leg Swelling    HPI HPI Comments: [name] is a 64 y.o. female brought in by ambulance, with PMHx of HTN, sarcoidosis, chronic pain syndrome, HLD, COPD, fibromyalgia, chronic renal insufficiency, and DM who presents to the Emergency Department complaining of bilateral leg swelling, worse on right with first onset 2 months ago. She notes swelling has affected her gait and is becoming more painful despite medications. She states she has been seen for same at [facility], and PCP and was referred to a GI doctor. Pt with h/o liver problems.  Pt is followed by pain management and takes several pain medications as needed. Pt denies fever, SOB and chest pain .   Past Medical History  Diagnosis Date  . Hypertension   . GERD (gastroesophageal reflux disease)   . Depression   . History of cardiac arrhythmia STATES NO ISSUES  FOR A LONG TIME    ATENOLOL CONTROLS PALPITATIONS  . Non-ischemic cardiomyopathy ECHO IN 2006 W/ CHART    CARDIAC CATH 2009  W/ CHART  . History of echocardiogram 05-24-2005   NORMAL LVSF ,  EF  50-60%  . Sarcoidosis   . Positive TB test   . Chronic bronchitis   . History of Bell's palsy 2002--  LEFT SIDE-- NO RESIDUAL   . Pelvic pain in female   . Frequency of urination   . Urgency of urination   . Stress incontinence, female   . Nocturia   . Chronic pain syndrome   . History of glaucoma STATES TREATED FOR ABOUT 5 YRS  AGO  --- NO ISSUES SINCE  . Uterine fibroid   . Ankle edema BILATERAL  . Hyperlipidemia   . COPD (chronic obstructive pulmonary disease) CHRONIC BRONCHITIS --  LAST BOUT  BILATERAL PNEUMONIA 2009  . Iron deficiency anemia   . H/O hiatal hernia   . Fibromyalgia   . Arthritis      BACK, KNEE, HIPS  . Chronic renal insufficiency   . Diabetes mellitus without complication    Past Surgical History  Procedure Laterality Date  . Cardiac catheterization  09-09-2008    NORMAL CORONARY ARTERIES/ NORMAL LVSF/ CHRONIC RENAL INSUFF.  . Bilateral benign breast tumor removed  YRS AGO  . Tubal ligation  YRS AGO  . Cysto with hydrodistension  01/07/2012    Procedure: CYSTOSCOPY/HYDRODISTENSION;  Surgeon: Reece Packer, MD;  Location: [facility];  Service: Urology;  Laterality: N/A;   OF BLADDER WITH INSTILLATION OF MARCAINE AND PRYDIUM  . Neck cyst     No family history on file. History  Substance Use Topics  . Smoking status: Former Smoker -- 1.00 packs/day for 27 years    Types: Cigarettes    Quit date: 12/06/1991  . Smokeless tobacco: Never Used  . Alcohol Use: No   OB History    No data available     Review of Systems A complete 10 system review of systems was obtained and all systems are negative except as noted in the HPI and PMH.     Allergies  Avapro; Lisinopril; and Latex  Home Medications   Prior to Admission medications   Medication Sig Start Date End  Date Taking? Authorizing Provider  albuterol (PROVENTIL HFA;VENTOLIN HFA) 108 (90 BASE) MCG/ACT inhaler Inhale 2 puffs into the lungs every 6 (six) hours as needed for wheezing or shortness of breath. Shortness of breath   Yes Historical Provider, MD  aspirin-acetaminophen-caffeine (EXCEDRIN EXTRA STRENGTH) [phone] MG per tablet Take 2 tablets by mouth every 6 (six) hours as needed for pain.   Yes Historical Provider, MD  atenolol (TENORMIN) 50 MG tablet Take 75 mg by mouth daily.   Yes Historical Provider, MD  beclomethasone (QVAR) 80 MCG/ACT inhaler Inhale 2 puffs into the lungs 2 (two) times daily.   Yes Historical Provider, MD  cetirizine (ZYRTEC) 10 MG tablet Take 10 mg by mouth daily.   Yes Historical Provider, MD  Cholecalciferol (VITAMIN D) 2000 UNITS CAPS Take 2,000 Units by  mouth daily.   Yes Historical Provider, MD  esomeprazole (NEXIUM) 40 MG capsule Take 40 mg by mouth daily before breakfast.   Yes Historical Provider, MD  Ferrous Gluconate (IRON) 240 (27 FE) MG TABS Take 240 mg by mouth daily.    Yes Historical Provider, MD  fluticasone (FLONASE) 50 MCG/ACT nasal spray Place 2 sprays into the nose as needed for allergies. congestion   Yes Historical Provider, MD  furosemide (LASIX) 40 MG tablet Take 40 mg by mouth daily.    Yes Historical Provider, MD  gabapentin (NEURONTIN) 600 MG tablet Take 600 mg by mouth 3 (three) times daily.   Yes Historical Provider, MD  glipiZIDE (GLUCOTROL XL) 10 MG 24 hr tablet Take 10 mg by mouth daily. Before main meal   Yes Historical Provider, MD  ibuprofen (ADVIL,MOTRIN) 600 MG tablet Take 600 mg by mouth every 6 (six) hours as needed for headache.   Yes Historical Provider, MD  metFORMIN (GLUCOPHAGE) 850 MG tablet Take 850 mg by mouth 2 (two) times daily with a meal.   Yes Historical Provider, MD  methocarbamol (ROBAXIN) 750 MG tablet Take 750 mg by mouth every 8 (eight) hours.    Yes Historical Provider, MD  oxycodone (ROXICODONE) 30 MG immediate release tablet Take 30 mg by mouth every 4 (four) hours as needed for pain. For pain   Yes Historical Provider, MD  potassium chloride SA (K-DUR,KLOR-CON) 20 MEQ tablet Take 20 mEq by mouth daily. 03/29/15  Yes Historical Provider, MD  simvastatin (ZOCOR) 40 MG tablet Take 40 mg by mouth every evening.   Yes Historical Provider, MD  triamterene-hydrochlorothiazide (MAXZIDE-25) 37.5-25 MG per tablet Take 1 tablet by mouth daily.  01/13/14  Yes Historical Provider, MD  VOLTAREN 1 % GEL Apply 1 application topically 3 (three) times daily as needed. Pain. Apply to knees, hands, and ankles. 02/10/15  Yes Historical Provider, MD   BP 137/71 mmHg  Pulse 65  Temp(Src) 98.5 F (36.9 C) (Oral)  Resp 12  SpO2 99% Physical Exam  Constitutional: She is oriented to person, place, and time. She  appears well-developed and well-nourished. No distress.  HENT:  Head: Normocephalic and atraumatic.  Nose: Nose normal.  Mouth/Throat: Oropharynx is clear and moist. No oropharyngeal exudate.  Eyes: Conjunctivae and EOM are normal. Pupils are equal, round, and reactive to light. No scleral icterus.  Neck: Normal range of motion. Neck supple. No JVD present. No tracheal deviation present. No thyromegaly present.  Cardiovascular: Normal rate, regular rhythm and normal heart sounds.  Exam reveals no gallop and no friction rub.   No murmur heard. Pulmonary/Chest: Effort normal and breath sounds normal. No respiratory distress. She has no wheezes.  She exhibits no tenderness.  Abdominal: Soft. Bowel sounds are normal. She exhibits no distension and no mass. There is no tenderness. There is no rebound and no guarding.  Musculoskeletal: Normal range of motion. She exhibits edema (bilateral LE). She exhibits no tenderness.  Lymphadenopathy:    She has no cervical adenopathy.  Neurological: She is alert and oriented to person, place, and time. No cranial nerve deficit. She exhibits normal muscle tone.  Skin: Skin is warm and dry. No rash noted. No erythema. No pallor.  Nursing note and vitals reviewed.   ED Course  Procedures (including critical care time) DIAGNOSTIC STUDIES: Oxygen Saturation is 99% on room air, normal by my interpretation.    COORDINATION OF CARE: 2:58 AM Discussed treatment plan with patient at beside, the patient agrees with the plan and has no further questions at this time.   Labs Review Labs Reviewed  CBC WITH DIFFERENTIAL/PLATELET - Abnormal; Notable for the following:    Hemoglobin 11.0 (*)    HCT 33.7 (*)    Lymphocytes Relative 47 (*)    All other components within normal limits  COMPREHENSIVE METABOLIC PANEL - Abnormal; Notable for the following:    Chloride 98 (*)    Glucose, Bld 102 (*)    All other components within normal limits  BRAIN NATRIURETIC PEPTIDE  - Abnormal; Notable for the following:    B Natriuretic Peptide 178.7 (*)    All other components within normal limits    Imaging Review No results found.   EKG Interpretation None      MDM   Final diagnoses:  None   Patient presents to the ED for worsening swelling of her legs, which first began 2 months ago.  She states her PCP and GI doctor have not been able to find the ideology of her symptoms.  She is compliant with her lasix medication as well.  She denies ever having a doppler study done and I believe this will be worthwhile.  Labs in the ED are unremarkable for a cause of her fluid overload.  She was given lovenox and instructed to return tomorrow to Radiology for ultrasound study.  She demonstrated understanding of this plan.  She appears in no acute distress and overall well.  Her VS remain within her normal limits and she is safe for DC with follow up tomorrow at Radiology.   I personally performed the services described in this documentation, which was scribed in my presence. The recorded information has been reviewed and is accurate.     Everlene Balls, MD 05/02/15 [phone]

## 2015-05-23 ENCOUNTER — Other Ambulatory Visit: Payer: Self-pay | Admitting: Gastroenterology

## 2015-08-23 ENCOUNTER — Encounter: Payer: Self-pay | Admitting: *Deleted

## 2015-08-24 ENCOUNTER — Encounter (INDEPENDENT_AMBULATORY_CARE_PROVIDER_SITE_OTHER): Payer: Self-pay

## 2015-08-24 ENCOUNTER — Encounter: Payer: Self-pay | Admitting: Diagnostic Neuroimaging

## 2015-08-24 ENCOUNTER — Ambulatory Visit (INDEPENDENT_AMBULATORY_CARE_PROVIDER_SITE_OTHER): Payer: Medicare Other | Admitting: Diagnostic Neuroimaging

## 2015-08-24 VITALS — BP 139/90 | HR 94 | Ht 69.5 in | Wt 256.4 lb

## 2015-08-24 DIAGNOSIS — M25569 Pain in unspecified knee: Secondary | ICD-10-CM

## 2015-08-24 DIAGNOSIS — R202 Paresthesia of skin: Secondary | ICD-10-CM | POA: Diagnosis not present

## 2015-08-24 NOTE — Progress Notes (Signed)
[facility]  PATIENT: [name] DOB: 10-Nov-1951  REFERRING CLINICIAN: L Garba HISTORY FROM: patient  REASON FOR VISIT: new consult    HISTORICAL  CHIEF COMPLAINT:  Chief Complaint  Patient presents with  . Neuropathy    rm 6, New Patient    HISTORY OF PRESENT ILLNESS:   64 year old female with history of hypertension, diabetes, fibromyalgia, ocular sarcoidosis, here for evaluation of chronic back pain, leg pain, since March 2016.  Patient reports onset of pain from her bilateral thighs to bilateral feet, right greater than left side, since March 2016. At onset of symptoms patient had significant bilateral leg swelling. Patient has not seen rheumatology. She is under pain management since 2008 for knee, neck, shoulder, low back pain.  Patient concerned that some new medical or neurologic process is going on and therefore requested evaluation.   REVIEW OF SYSTEMS: Full 14 system review of systems performed and notable only for weight loss swelling in legs hearing loss itching incontinence allergies frequent infection joint pain joint swelling cramps aching muscles feeling cold anemia easy bruising blurred vision shortness of breath snoring constipation incontinence diarrhea.   ALLERGIES: Allergies  Allergen Reactions  . Avapro [Irbesartan] Other (See Comments)    ANGIOEDEMA  . Lisinopril Other (See Comments)    ANGIOEDEMA  . Latex Swelling and Rash    HOME MEDICATIONS: Outpatient Prescriptions Prior to Visit  Medication Sig Dispense Refill  . albuterol (PROVENTIL HFA;VENTOLIN HFA) 108 (90 BASE) MCG/ACT inhaler Inhale 2 puffs into the lungs every 6 (six) hours as needed for wheezing or shortness of breath. Shortness of breath    . aspirin-acetaminophen-caffeine (EXCEDRIN EXTRA STRENGTH) 250-250-65 MG per tablet Take 2 tablets by mouth every 6 (six) hours as needed for pain.    Marland Kitchen atenolol (TENORMIN) 50 MG tablet Take 75 mg by mouth daily.    .  beclomethasone (QVAR) 80 MCG/ACT inhaler Inhale 2 puffs into the lungs 2 (two) times daily.    . cetirizine (ZYRTEC) 10 MG tablet Take 10 mg by mouth daily.    . Cholecalciferol (VITAMIN D) 2000 UNITS CAPS Take 2,000 Units by mouth daily.    Marland Kitchen esomeprazole (NEXIUM) 40 MG capsule Take 40 mg by mouth daily before breakfast.    . Ferrous Gluconate (IRON) 240 (27 FE) MG TABS Take 240 mg by mouth daily.     . fluticasone (FLONASE) 50 MCG/ACT nasal spray Place 2 sprays into the nose as needed for allergies. congestion    . furosemide (LASIX) 40 MG tablet Take 40 mg by mouth daily.     Marland Kitchen gabapentin (NEURONTIN) 600 MG tablet Take 600 mg by mouth 3 (three) times daily.    Marland Kitchen glipiZIDE (GLUCOTROL XL) 10 MG 24 hr tablet Take 10 mg by mouth daily. Before main meal    . ibuprofen (ADVIL,MOTRIN) 600 MG tablet Take 600 mg by mouth every 6 (six) hours as needed for headache.    . metFORMIN (GLUCOPHAGE) 850 MG tablet Take 850 mg by mouth 2 (two) times daily with a meal.    . methocarbamol (ROBAXIN) 750 MG tablet Take 750 mg by mouth every 8 (eight) hours.     Marland Kitchen oxycodone (ROXICODONE) 30 MG immediate release tablet Take 30 mg by mouth every 4 (four) hours as needed for pain. For pain    . potassium chloride SA (K-DUR,KLOR-CON) 20 MEQ tablet Take 20 mEq by mouth daily.  0  . triamterene-hydrochlorothiazide (MAXZIDE-25) 37.5-25 MG per tablet Take 1 tablet by mouth  daily.     . VOLTAREN 1 % GEL Apply 1 application topically 3 (three) times daily as needed. Pain. Apply to knees, hands, and ankles.  0  . simvastatin (ZOCOR) 40 MG tablet Take 40 mg by mouth every evening.     No facility-administered medications prior to visit.    PAST MEDICAL HISTORY: Past Medical History  Diagnosis Date  . Hypertension   . GERD (gastroesophageal reflux disease)   . Depression   . History of cardiac arrhythmia STATES NO ISSUES  FOR A LONG TIME    ATENOLOL CONTROLS PALPITATIONS  . Non-ischemic cardiomyopathy ECHO IN 2006 W/  CHART    CARDIAC CATH 2009  W/ CHART  . History of echocardiogram 05-24-2005   NORMAL LVSF ,  EF  50-60%  . Sarcoidosis   . Positive TB test   . Chronic bronchitis   . History of Bell's palsy 2002--  LEFT SIDE-- NO RESIDUAL   . Pelvic pain in female   . Frequency of urination   . Urgency of urination   . Stress incontinence, female   . Nocturia   . Chronic pain syndrome   . History of glaucoma STATES TREATED FOR ABOUT 5 YRS  AGO  --- NO ISSUES SINCE  . Uterine fibroid   . Ankle edema BILATERAL  . Hyperlipidemia   . COPD (chronic obstructive pulmonary disease) CHRONIC BRONCHITIS --  LAST BOUT  BILATERAL PNEUMONIA 2009  . Iron deficiency anemia   . H/O hiatal hernia   . Fibromyalgia   . Arthritis     BACK, KNEE, HIPS  . Chronic renal insufficiency   . Diabetes mellitus without complication     PAST SURGICAL HISTORY: Past Surgical History  Procedure Laterality Date  . Cardiac catheterization  09-09-2008    NORMAL CORONARY ARTERIES/ NORMAL LVSF/ CHRONIC RENAL INSUFF.  . Bilateral benign breast tumor removed  YRS AGO  . Tubal ligation  YRS AGO  . Cysto with hydrodistension  01/07/2012    Procedure: CYSTOSCOPY/HYDRODISTENSION;  Surgeon: Reece Packer, MD;  Location: [facility];  Service: Urology;  Laterality: N/A;   OF BLADDER WITH INSTILLATION OF MARCAINE AND PRYDIUM  . Neck cyst      FAMILY HISTORY: Family History  Problem Relation Age of Onset  . Cancer Mother   . Diabetes Mother   . Hypertension Mother   . Cancer Sister   . Hypertension Sister   . Hypertension Son     SOCIAL HISTORY:  Social History   Social History  . Marital Status: Married    Spouse Name: Christia Reading  . Number of Children: 5  . Years of Education: 13-14   Occupational History  .      CME, lab tech   Social History Main Topics  . Smoking status: Former Smoker -- 1.00 packs/day for 27 years    Types: Cigarettes    Quit date: 12/06/1991  . Smokeless tobacco: Never Used   . Alcohol Use: No  . Drug Use: No  . Sexual Activity: Not on file   Other Topics Concern  . Not on file   Social History Narrative   Lives with 2 grandchildren 1 stepson, husband   caffeine use - coffee, tea 1-2 c daily     PHYSICAL EXAM  GENERAL EXAM/CONSTITUTIONAL: Vitals:  Filed Vitals:   08/24/15 1409  BP: 139/90  Pulse: 94  Height: 5' 9.5" (1.765 m)  Weight: 256 lb 6.4 oz (116.302 kg)     Body mass  index is 37.33 kg/(m^2).  Visual Acuity Screening   Right eye Left eye Both eyes  Without correction:     With correction: 20/30 20/20      Patient is in no distress; well developed, nourished and groomed; neck is supple  CARDIOVASCULAR:  Examination of carotid arteries is normal; no carotid bruits  Regular rate and rhythm, no murmurs  Examination of peripheral vascular system by observation and palpation is normal  EYES:  Ophthalmoscopic exam of optic discs and posterior segments is normal; no papilledema or hemorrhages  MUSCULOSKELETAL:  Gait, strength, tone, movements noted in Neurologic exam below  NEUROLOGIC: MENTAL STATUS:  No flowsheet data found.  awake, alert, oriented to person, place and time  recent and remote memory intact  normal attention and concentration  language fluent, comprehension intact, naming intact,   fund of knowledge appropriate  CRANIAL NERVE:   2nd - no papilledema on fundoscopic exam  2nd, 3rd, 4th, 6th - pupils equal and reactive to light, visual fields full to confrontation, extraocular muscles intact, no nystagmus  5th - facial sensation symmetric  7th - facial strength symmetric  8th - hearing intact  9th - palate elevates symmetrically, uvula midline  11th - shoulder shrug symmetric  12th - tongue protrusion midline  MOTOR:   normal bulk and tone, SLOW, LIMITED BY PAIN, BUT full strength in the BUE, BLE  SENSORY:   normal and symmetric to light touch, pinprick, temperature,  vibration  COORDINATION:   finger-nose-finger, fine finger movements normal  REFLEXES:   deep tendon reflexes TRACE and symmetric; ABSENT AT ANKLES  GAIT/STATION:   narrow based gait; SLOW ANTALGIC    DIAGNOSTIC DATA (LABS, IMAGING, TESTING) - I reviewed patient records, labs, notes, testing and imaging myself where available.  Lab Results  Component Value Date   WBC 6.1 05/02/2015   HGB 11.0* 05/02/2015   HCT 33.7* 05/02/2015   MCV 86.4 05/02/2015   PLT 215 05/02/2015      Component Value Date/Time   NA 136 05/02/2015 0244   K 3.5 05/02/2015 0244   CL 98* 05/02/2015 0244   CO2 29 05/02/2015 0244   GLUCOSE 102* 05/02/2015 0244   BUN 11 05/02/2015 0244   CREATININE 0.59 05/02/2015 0244   CALCIUM 9.3 05/02/2015 0244   PROT 6.6 05/02/2015 0244   ALBUMIN 3.6 05/02/2015 0244   AST 30 05/02/2015 0244   ALT 22 05/02/2015 0244   ALKPHOS 62 05/02/2015 0244   BILITOT 0.4 05/02/2015 0244   GFRNONAA >60 05/02/2015 0244   GFRAA >60 05/02/2015 0244   Lab Results  Component Value Date   CHOL 202* 03/01/2010   HDL 103 03/01/2010   LDLCALC 85 03/01/2010   LDLDIRECT 68 09/27/2010   TRIG 71 03/01/2010   CHOLHDL 2.0 Ratio 03/01/2010   Lab Results  Component Value Date   HGBA1C * 10/06/2008    9.4 (NOTE)   The ADA recommends the following therapeutic goal for glycemic   control related to Hgb A1C measurement:   Goal of Therapy:   < 7.0% Hgb A1C   Reference: American Diabetes Association: Clinical Practice   Recommendations 2008, Diabetes Care,  2008, 31:(Suppl 1).   No results found for: VITAMINB12 Lab Results  Component Value Date   TSH 0.532 03/01/2010    03/09/15 MR LUMBAR SPINE [I reviewed images myself and agree with interpretation. -VRP]  - Degenerative change of lumbar spine, without acute fracture or malalignment. Reactive L4-5 facet arthropathy. - No canal stenosis. Mild L4-5  neural foraminal narrowing.  03/09/15  MR THORACIC SPINE [I reviewed images myself  and agree with interpretation. -VRP]  - Mild degenerative change of the thoracic spine without acute fracture or malalignment. - No canal stenosis. Mild T10-11 neural foraminal narrowing.     ASSESSMENT AND PLAN  64 y.o. year old female here with history of chronic pain since 2006 with neck, shoulder, lower back, the pain, currently under pain management. Now with increasing pain in bilateral thighs to feet, associated with leg swelling initially, since March 2016. Could represent onset of new neuropathy or myopathy/myositis process. We'll check further testing.  Ddx: neuropathy, lumbar radiculopathy, myopathy, pain syndrome / fibromyalgia, rheumatologic disease ( ? sarcoid, lupus, sjogrens, MCTD)  PLAN:  Orders Placed This Encounter  Procedures  . CK  . Aldolase  . Neuropathy Panel  . NCV with EMG(electromyography)   Return for for NCV/EMG.    Penni Bombard, MD 06/23/5365, 4:40 PM Certified in Neurology, Neurophysiology and Neuroimaging  [facility] [address] [place], [place] [address] [phone]

## 2015-08-24 NOTE — Patient Instructions (Signed)
I will check EMG/NCS (electrical nerve test) and lab testing.

## 2015-08-29 LAB — NEUROPATHY PANEL
A/G RATIO SPE: 1.1 (ref 0.7–1.7)
ALPHA 1: 0.2 g/dL (ref 0.0–0.4)
Albumin ELP: 3.9 g/dL (ref 2.9–4.4)
Alpha 2: 0.6 g/dL (ref 0.4–1.0)
Angio Convert Enzyme: 68 U/L (ref 14–82)
Anti Nuclear Antibody(ANA): NEGATIVE
Beta: 1.2 g/dL (ref 0.7–1.3)
GAMMA GLOBULIN: 1.3 g/dL (ref 0.4–1.8)
Globulin, Total: 3.4 g/dL (ref 2.2–3.9)
Rhuematoid fact SerPl-aCnc: <10 IU/mL (ref 0.0–13.9)
SED RATE: 7 mm/h (ref 0–40)
TSH: 0.522 u[IU]/mL (ref 0.450–4.500)
Total Protein: 7.3 g/dL (ref 6.0–8.5)
VIT D 25 HYDROXY: 28.6 ng/mL — AB (ref 30.0–100.0)
Vitamin B-12: 1193 pg/mL — ABNORMAL HIGH (ref 211–946)

## 2015-08-29 LAB — CK: CK TOTAL: 170 U/L (ref 24–173)

## 2015-08-29 LAB — ALDOLASE: ALDOLASE: 4.6 U/L (ref 3.3–10.3)

## 2015-08-31 ENCOUNTER — Telehealth: Payer: Self-pay | Admitting: *Deleted

## 2015-08-31 ENCOUNTER — Ambulatory Visit (INDEPENDENT_AMBULATORY_CARE_PROVIDER_SITE_OTHER): Payer: Medicare Other | Admitting: Diagnostic Neuroimaging

## 2015-08-31 ENCOUNTER — Encounter (INDEPENDENT_AMBULATORY_CARE_PROVIDER_SITE_OTHER): Payer: Self-pay | Admitting: Diagnostic Neuroimaging

## 2015-08-31 DIAGNOSIS — Z0289 Encounter for other administrative examinations: Secondary | ICD-10-CM

## 2015-08-31 DIAGNOSIS — M25569 Pain in unspecified knee: Secondary | ICD-10-CM | POA: Diagnosis not present

## 2015-08-31 DIAGNOSIS — M25561 Pain in right knee: Secondary | ICD-10-CM

## 2015-08-31 DIAGNOSIS — M25562 Pain in left knee: Secondary | ICD-10-CM | POA: Diagnosis not present

## 2015-08-31 NOTE — Telephone Encounter (Signed)
-----   Message from Penni Bombard, MD sent at 08/31/2015  9:24 AM EDT ----- Regarding: RE: Re: Lab results Labs ok. Can take daily multivitamin if not already taking.  -VRP  ----- Message -----    From: Minna Antis, RN    Sent: 08/30/2015   5:28 PM      To: Penni Bombard, MD Subject: Re: Lab results                                Dr Mamie Nick,  I noticed all her labs are back.  Results are normal except Vit D, B12.  Let me know what you'd like me to tell her, and I'll call her.  Thank you, Verneita Griffes

## 2015-08-31 NOTE — Telephone Encounter (Signed)
Per Dr Leta Baptist, spoke with patient and relayed his message re: labs are okay. She states she takes a multivitamin "but not every day". She verbalized understanding, appreciation for this call.

## 2015-09-04 NOTE — Procedures (Signed)
   [facility]  NCS (NERVE CONDUCTION STUDY) WITH EMG (ELECTROMYOGRAPHY) REPORT   STUDY DATE: 08/31/15 PATIENT NAME: [name] DOB: 1951-11-22 MRN: [id number]  ORDERING CLINICIAN: Andrey Spearman, MD   TECHNOLOGIST: Laretta Alstrom  ELECTROMYOGRAPHER: Earlean Polka. Ashley Montminy, MD  CLINICAL INFORMATION: 64 year old female with lower extremity pain/weakness. History of chronic pain. History of sarcoidosis.  FINDINGS: NERVE CONDUCTION STUDY: Bilateral peroneal motor responses have normal distal latencies, decreased amplitudes, normal conduction velocities and normal F-wave latencies.  Bilateral tibial motor responses are prolonged distal latencies, normal amplitudes, normal conduction velocity is in normal F-wave latencies.  Bilateral sural and peroneal sensory responses are normal.   NEEDLE ELECTROMYOGRAPHY: Needle examination of right lower extremity demonstrates: Vastus medialis - no abnormal spontaneous activity at rest; early recruitment of small polyphasic motor units on exertion Tibialis anterior - no abnormal spontaneous activity at rest; normal motor unit recruitment on exertion Gastrocnemius - no abnormal spontaneous activity at rest; early recruitment of small polyphasic motor units on exertion Paraspinal muscles were deferred as patient had significant low back pain when trying to lay on her side.  IMPRESSION:  Abnormal study demonstrate: 1. 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 Sensory nerves are normal. 2. Needle examination demonstrates myopathic changes affecting right vastus medialis and right gastrocnemius muscles. Raises possibility of underlying myopathy.     INTERPRETING PHYSICIAN:  Penni Bombard, MD Certified in Neurology, Neurophysiology and Neuroimaging  [facility] [address] [place], [place]  [address] [phone]

## 2015-10-02 ENCOUNTER — Other Ambulatory Visit: Payer: Self-pay | Admitting: Neurosurgery

## 2015-10-03 ENCOUNTER — Encounter (HOSPITAL_COMMUNITY): Payer: Self-pay | Admitting: *Deleted

## 2015-10-03 NOTE — Progress Notes (Signed)
   10/03/15 0930  OBSTRUCTIVE SLEEP APNEA  Have you ever been diagnosed with sleep apnea through a sleep study? No  Do you snore loudly (loud enough to be heard through closed doors)?  1  Do you often feel tired, fatigued, or sleepy during the daytime (such as falling asleep during driving or talking to someone)? 0  Has anyone observed you stop breathing during your sleep? 1  Do you have, or are you being treated for high blood pressure? 1  BMI more than 35 kg/m2? 1  Age > 69 (1-yes) 1  Female Gender (Yes=1) 0  Obstructive Sleep Apnea Score 5

## 2015-10-04 ENCOUNTER — Encounter (HOSPITAL_COMMUNITY)
Admission: RE | Disposition: A | Discharge status: Home / Self Care | Payer: Self-pay | Source: Ambulatory Visit | Attending: Neurosurgery

## 2015-10-04 ENCOUNTER — Encounter (HOSPITAL_COMMUNITY): Payer: Self-pay | Admitting: *Deleted

## 2015-10-04 ENCOUNTER — Ambulatory Visit (HOSPITAL_COMMUNITY)
Admission: RE | Admit: 2015-10-04 | Discharge: 2015-10-04 | Disposition: A | Discharge status: Home / Self Care | Payer: Medicare Other | Source: Ambulatory Visit | Attending: Neurosurgery | Admitting: Neurosurgery

## 2015-10-04 ENCOUNTER — Ambulatory Visit (HOSPITAL_COMMUNITY): Payer: Medicare Other | Admitting: Anesthesiology

## 2015-10-04 DIAGNOSIS — G729 Myopathy, unspecified: Secondary | ICD-10-CM | POA: Insufficient documentation

## 2015-10-04 DIAGNOSIS — E785 Hyperlipidemia, unspecified: Secondary | ICD-10-CM | POA: Insufficient documentation

## 2015-10-04 DIAGNOSIS — G7289 Other specified myopathies: Secondary | ICD-10-CM | POA: Insufficient documentation

## 2015-10-04 DIAGNOSIS — J449 Chronic obstructive pulmonary disease, unspecified: Secondary | ICD-10-CM | POA: Diagnosis not present

## 2015-10-04 DIAGNOSIS — Z7984 Long term (current) use of oral hypoglycemic drugs: Secondary | ICD-10-CM | POA: Diagnosis not present

## 2015-10-04 DIAGNOSIS — Z7982 Long term (current) use of aspirin: Secondary | ICD-10-CM | POA: Insufficient documentation

## 2015-10-04 DIAGNOSIS — I10 Essential (primary) hypertension: Secondary | ICD-10-CM | POA: Diagnosis not present

## 2015-10-04 DIAGNOSIS — Z87891 Personal history of nicotine dependence: Secondary | ICD-10-CM | POA: Diagnosis not present

## 2015-10-04 DIAGNOSIS — Z79899 Other long term (current) drug therapy: Secondary | ICD-10-CM | POA: Diagnosis not present

## 2015-10-04 DIAGNOSIS — E1140 Type 2 diabetes mellitus with diabetic neuropathy, unspecified: Secondary | ICD-10-CM | POA: Insufficient documentation

## 2015-10-04 DIAGNOSIS — G894 Chronic pain syndrome: Secondary | ICD-10-CM | POA: Insufficient documentation

## 2015-10-04 HISTORY — DX: Personal history of colonic polyps: Z86.010

## 2015-10-04 HISTORY — DX: Personal history of colon polyps, unspecified: Z86.0100

## 2015-10-04 HISTORY — DX: Polyneuropathy, unspecified: G62.9

## 2015-10-04 HISTORY — DX: Unspecified cataract: H26.9

## 2015-10-04 HISTORY — DX: Headache: R51

## 2015-10-04 HISTORY — DX: Pain in unspecified joint: M25.50

## 2015-10-04 HISTORY — DX: Dorsalgia, unspecified: M54.9

## 2015-10-04 HISTORY — DX: Diverticulosis of intestine, part unspecified, without perforation or abscess without bleeding: K57.90

## 2015-10-04 HISTORY — DX: Post-traumatic stress disorder, unspecified: F43.10

## 2015-10-04 HISTORY — PX: MUSCLE BIOPSY: SHX716

## 2015-10-04 HISTORY — DX: Personal history of other diseases of the digestive system: Z87.19

## 2015-10-04 HISTORY — DX: Effusion, unspecified joint: M25.40

## 2015-10-04 HISTORY — DX: Pneumonia, unspecified organism: J18.9

## 2015-10-04 HISTORY — DX: Personal history of peptic ulcer disease: Z87.11

## 2015-10-04 HISTORY — DX: Other seasonal allergic rhinitis: J30.2

## 2015-10-04 HISTORY — DX: Vitamin D deficiency, unspecified: E55.9

## 2015-10-04 HISTORY — DX: Headache, unspecified: R51.9

## 2015-10-04 HISTORY — DX: Other chronic pain: G89.29

## 2015-10-04 HISTORY — DX: Personal history of other medical treatment: Z92.89

## 2015-10-04 HISTORY — DX: Unspecified asthma, uncomplicated: J45.909

## 2015-10-04 HISTORY — DX: Inflammatory liver disease, unspecified: K75.9

## 2015-10-04 LAB — COMPREHENSIVE METABOLIC PANEL
ALK PHOS: 64 U/L (ref 38–126)
ALT: 15 U/L (ref 14–54)
ANION GAP: 9 (ref 5–15)
AST: 21 U/L (ref 15–41)
Albumin: 3.6 g/dL (ref 3.5–5.0)
BUN: 15 mg/dL (ref 6–20)
CALCIUM: 9.1 mg/dL (ref 8.9–10.3)
CHLORIDE: 99 mmol/L — AB (ref 101–111)
CO2: 29 mmol/L (ref 22–32)
CREATININE: 0.66 mg/dL (ref 0.44–1.00)
Glucose, Bld: 118 mg/dL — ABNORMAL HIGH (ref 65–99)
Potassium: 3.5 mmol/L (ref 3.5–5.1)
SODIUM: 137 mmol/L (ref 135–145)
Total Bilirubin: 0.5 mg/dL (ref 0.3–1.2)
Total Protein: 6.7 g/dL (ref 6.5–8.1)

## 2015-10-04 LAB — CBC
HCT: 31.7 % — ABNORMAL LOW (ref 36.0–46.0)
HEMOGLOBIN: 10.4 g/dL — AB (ref 12.0–15.0)
MCH: 28.3 pg (ref 26.0–34.0)
MCHC: 32.8 g/dL (ref 30.0–36.0)
MCV: 86.4 fL (ref 78.0–100.0)
PLATELETS: 176 10*3/uL (ref 150–400)
RBC: 3.67 MIL/uL — AB (ref 3.87–5.11)
RDW: 14.7 % (ref 11.5–15.5)
WBC: 4.3 10*3/uL (ref 4.0–10.5)

## 2015-10-04 LAB — GLUCOSE, CAPILLARY
GLUCOSE-CAPILLARY: 108 mg/dL — AB (ref 65–99)
GLUCOSE-CAPILLARY: 85 mg/dL (ref 65–99)

## 2015-10-04 SURGERY — BIOPSY, MUSCLE, GASTROCNEMIUS
Anesthesia: General | Site: Leg Upper | Laterality: Left

## 2015-10-04 MED ORDER — MIDAZOLAM HCL 2 MG/2ML IJ SOLN
INTRAMUSCULAR | Status: AC
  Filled 2015-10-04: qty 4

## 2015-10-04 MED ORDER — LACTATED RINGERS IV SOLN
INTRAVENOUS | Status: DC
  Administered 2015-10-04 (×2): via INTRAVENOUS

## 2015-10-04 MED ORDER — CEFAZOLIN SODIUM-DEXTROSE 2-3 GM-% IV SOLR
INTRAVENOUS | Status: AC
  Filled 2015-10-04: qty 50

## 2015-10-04 MED ORDER — CEFAZOLIN SODIUM-DEXTROSE 2-3 GM-% IV SOLR
2.0000 g | Freq: Once | INTRAVENOUS | Status: AC
  Administered 2015-10-04: 2 g via INTRAVENOUS
  Filled 2015-10-04: qty 50

## 2015-10-04 MED ORDER — FENTANYL CITRATE (PF) 250 MCG/5ML IJ SOLN
INTRAMUSCULAR | Status: DC | PRN
  Administered 2015-10-04 (×2): 50 ug via INTRAVENOUS
  Administered 2015-10-04 (×2): 100 ug via INTRAVENOUS
  Administered 2015-10-04 (×2): 50 ug via INTRAVENOUS

## 2015-10-04 MED ORDER — HYDROMORPHONE HCL 1 MG/ML IJ SOLN: 0.2500 mg | INTRAMUSCULAR | Status: DC | PRN

## 2015-10-04 MED ORDER — FENTANYL CITRATE (PF) 250 MCG/5ML IJ SOLN
INTRAMUSCULAR | Status: AC
  Filled 2015-10-04: qty 5

## 2015-10-04 MED ORDER — MIDAZOLAM HCL 5 MG/5ML IJ SOLN
INTRAMUSCULAR | Status: DC | PRN
  Administered 2015-10-04: 2 mg via INTRAVENOUS

## 2015-10-04 MED ORDER — PROPOFOL 10 MG/ML IV BOLUS
INTRAVENOUS | Status: AC
  Filled 2015-10-04: qty 20

## 2015-10-04 MED ORDER — PROPOFOL 10 MG/ML IV BOLUS
INTRAVENOUS | Status: DC | PRN
  Administered 2015-10-04: 200 mg via INTRAVENOUS
  Administered 2015-10-04: 50 mg via INTRAVENOUS

## 2015-10-04 MED ORDER — BUPIVACAINE HCL (PF) 0.25 % IJ SOLN
INTRAMUSCULAR | Status: DC | PRN
  Administered 2015-10-04 (×2): 30 mL

## 2015-10-04 MED ORDER — LIDOCAINE HCL (CARDIAC) 20 MG/ML IV SOLN
INTRAVENOUS | Status: DC | PRN
  Administered 2015-10-04: 100 mg via INTRAVENOUS

## 2015-10-04 MED ORDER — HYDROCODONE-ACETAMINOPHEN 5-325 MG PO TABS: 1.0000 | ORAL_TABLET | Freq: Four times a day (QID) | ORAL | Status: DC | PRN

## 2015-10-04 MED ORDER — PROPOFOL 500 MG/50ML IV EMUL
INTRAVENOUS | Status: DC | PRN
  Administered 2015-10-04: 120 ug/kg/min via INTRAVENOUS

## 2015-10-04 MED ORDER — ESMOLOL HCL 10 MG/ML IV SOLN
INTRAVENOUS | Status: AC
  Filled 2015-10-04: qty 10

## 2015-10-04 MED ORDER — LIDOCAINE HCL (CARDIAC) 20 MG/ML IV SOLN
INTRAVENOUS | Status: AC
  Filled 2015-10-04: qty 10

## 2015-10-04 MED ORDER — LIDOCAINE-EPINEPHRINE 0.5 %-1:200000 IJ SOLN
INTRAMUSCULAR | Status: DC | PRN
  Administered 2015-10-04: 20 mL

## 2015-10-04 MED ORDER — 0.9 % SODIUM CHLORIDE (POUR BTL) OPTIME
TOPICAL | Status: DC | PRN
  Administered 2015-10-04: 1000 mL

## 2015-10-04 SURGICAL SUPPLY — 77 items
BANDAGE ELASTIC 4 VELCRO ST LF (GAUZE/BANDAGES/DRESSINGS) IMPLANT
BANDAGE ELASTIC 6 VELCRO ST LF (GAUZE/BANDAGES/DRESSINGS) IMPLANT
BENZOIN TINCTURE PRP APPL 2/3 (GAUZE/BANDAGES/DRESSINGS) IMPLANT
BLADE CLIPPER SURG (BLADE) IMPLANT
BLADE SURG 15 STRL LF DISP TIS (BLADE) ×1 IMPLANT
BLADE SURG 15 STRL SS (BLADE) ×2
BNDG GAUZE ELAST 4 BULKY (GAUZE/BANDAGES/DRESSINGS) IMPLANT
CLOSURE WOUND 1/2 X4 (GAUZE/BANDAGES/DRESSINGS)
CORDS BIPOLAR (ELECTRODE) ×3 IMPLANT
DECANTER SPIKE VIAL GLASS SM (MISCELLANEOUS) IMPLANT
DEPRESSOR TONGUE BLADE STERILE (MISCELLANEOUS) IMPLANT
DERMABOND ADHESIVE PROPEN (GAUZE/BANDAGES/DRESSINGS) ×2
DERMABOND ADVANCED .7 DNX6 (GAUZE/BANDAGES/DRESSINGS) ×1 IMPLANT
DRAPE EXTREMITY T 121X128X90 (DRAPE) IMPLANT
DRAPE INCISE IOBAN 66X45 STRL (DRAPES) ×3 IMPLANT
DRAPE LAPAROTOMY 100X72 PEDS (DRAPES) ×3 IMPLANT
DRAPE PROXIMA HALF (DRAPES) IMPLANT
DRSG OPSITE POSTOP 3X4 (GAUZE/BANDAGES/DRESSINGS) ×3 IMPLANT
DURAPREP 26ML APPLICATOR (WOUND CARE) ×3 IMPLANT
ELECT CAUTERY BLADE 6.4 (BLADE) ×3 IMPLANT
GAUZE SPONGE 4X4 16PLY XRAY LF (GAUZE/BANDAGES/DRESSINGS) ×3 IMPLANT
GLOVE BIO SURGEON STRL SZ 6.5 (GLOVE) IMPLANT
GLOVE BIO SURGEON STRL SZ7 (GLOVE) IMPLANT
GLOVE BIO SURGEON STRL SZ7.5 (GLOVE) IMPLANT
GLOVE BIO SURGEON STRL SZ8 (GLOVE) IMPLANT
GLOVE BIO SURGEON STRL SZ8.5 (GLOVE) IMPLANT
GLOVE BIO SURGEONS STRL SZ 6.5 (GLOVE)
GLOVE BIOGEL M 8.0 STRL (GLOVE) IMPLANT
GLOVE BIOGEL PI IND STRL 6.5 (GLOVE) ×1 IMPLANT
GLOVE BIOGEL PI INDICATOR 6.5 (GLOVE) ×2
GLOVE ECLIPSE 6.5 STRL STRAW (GLOVE) IMPLANT
GLOVE ECLIPSE 7.0 STRL STRAW (GLOVE) IMPLANT
GLOVE ECLIPSE 7.5 STRL STRAW (GLOVE) IMPLANT
GLOVE ECLIPSE 8.0 STRL XLNG CF (GLOVE) IMPLANT
GLOVE ECLIPSE 8.5 STRL (GLOVE) IMPLANT
GLOVE EXAM NITRILE LRG STRL (GLOVE) IMPLANT
GLOVE EXAM NITRILE MD LF STRL (GLOVE) IMPLANT
GLOVE EXAM NITRILE XL STR (GLOVE) IMPLANT
GLOVE EXAM NITRILE XS STR PU (GLOVE) IMPLANT
GLOVE INDICATOR 6.5 STRL GRN (GLOVE) IMPLANT
GLOVE INDICATOR 7.0 STRL GRN (GLOVE) IMPLANT
GLOVE INDICATOR 7.5 STRL GRN (GLOVE) IMPLANT
GLOVE INDICATOR 8.0 STRL GRN (GLOVE) IMPLANT
GLOVE INDICATOR 8.5 STRL (GLOVE) IMPLANT
GLOVE OPTIFIT SS 8.0 STRL (GLOVE) IMPLANT
GLOVE SURG SS PI 6.5 STRL IVOR (GLOVE) ×3 IMPLANT
GOWN STRL REUS W/ TWL LRG LVL3 (GOWN DISPOSABLE) ×2 IMPLANT
GOWN STRL REUS W/ TWL XL LVL3 (GOWN DISPOSABLE) IMPLANT
GOWN STRL REUS W/TWL 2XL LVL3 (GOWN DISPOSABLE) IMPLANT
GOWN STRL REUS W/TWL LRG LVL3 (GOWN DISPOSABLE) ×4
GOWN STRL REUS W/TWL XL LVL3 (GOWN DISPOSABLE)
KIT BASIN OR (CUSTOM PROCEDURE TRAY) ×3 IMPLANT
KIT ROOM TURNOVER OR (KITS) ×3 IMPLANT
MARKER SKIN DUAL TIP RULER LAB (MISCELLANEOUS) ×3 IMPLANT
NEEDLE HYPO 25X1 1.5 SAFETY (NEEDLE) ×3 IMPLANT
NEEDLE SPNL 22GX3.5 QUINCKE BK (NEEDLE) ×3 IMPLANT
NS IRRIG 1000ML POUR BTL (IV SOLUTION) ×3 IMPLANT
PACK SURGICAL SETUP 50X90 (CUSTOM PROCEDURE TRAY) ×3 IMPLANT
PAD ARMBOARD 7.5X6 YLW CONV (MISCELLANEOUS) ×6 IMPLANT
PENCIL BUTTON HOLSTER BLD 10FT (ELECTRODE) ×3 IMPLANT
SPECIMEN JAR SMALL (MISCELLANEOUS) IMPLANT
STRIP CLOSURE SKIN 1/2X4 (GAUZE/BANDAGES/DRESSINGS) IMPLANT
SUT SILK 0 TIES 10X30 (SUTURE) IMPLANT
SUT SILK 2 0 TIES 10X30 (SUTURE) IMPLANT
SUT SILK 3 0 TIES 17X18 (SUTURE)
SUT SILK 3-0 18XBRD TIE BLK (SUTURE) IMPLANT
SUT VIC AB 2-0 CT1 18 (SUTURE) ×3 IMPLANT
SUT VIC AB 2-0 CT1 27 (SUTURE)
SUT VIC AB 2-0 CT1 27XBRD (SUTURE) IMPLANT
SUT VIC AB 3-0 SH 8-18 (SUTURE) ×6 IMPLANT
SYR BULB 3OZ (MISCELLANEOUS) ×3 IMPLANT
SYR CONTROL 10ML LL (SYRINGE) ×3 IMPLANT
TOWEL OR 17X24 6PK STRL BLUE (TOWEL DISPOSABLE) ×3 IMPLANT
TOWEL OR 17X26 10 PK STRL BLUE (TOWEL DISPOSABLE) ×3 IMPLANT
TUBE CONNECTING 12'X1/4 (SUCTIONS) ×1
TUBE CONNECTING 12X1/4 (SUCTIONS) ×2 IMPLANT
WATER STERILE IRR 1000ML POUR (IV SOLUTION) ×3 IMPLANT

## 2015-10-04 NOTE — Op Note (Signed)
10/04/2015  6:34 PM  PATIENT:  [name]  64 y.o. female with weakness of unknown etiology. She will be taken to the OR for a left vastus lateralis muscle biopsy.  PRE-OPERATIVE DIAGNOSIS:  myopathy  POST-OPERATIVE DIAGNOSIS:  myopathy  PROCEDURE:  Procedure(s): LEFT Vastus Lateralis Muscle Biopsy  SURGEON: Surgeon(s): Ashok Pall, MD  ASSISTANTS:none  ANESTHESIA:   general  EBL:  Total I/O In: 1000 [I.V.:1000] Out: -   BLOOD ADMINISTERED:none  CELL SAVER GIVEN:none  COUNT:per nursing  DRAINS: none   SPECIMEN:  Source of Specimen:  left vastus lateralis muscle  DICTATION: [name] was taken to the operating room, had a laryngeal mask airway placed and placed under a general anesthetic. She was positioned supine with the left lower extremity elevated at the knee. Her left thigh was prepared and draped in a sterile manner. I opened the skin with a 10 blade and started dissection through the subcutaneous tissue. I eventually used the Metrex tubular retractor to fully expose the muscle. I took three pieces of muscle and sent them to pathology per protocol. I infiltrated 40cc 1/4% marcaine, and 20cc lidocaine into the wound. I approximated the fascia with vicryl sutures. I approximated the subcutaneous and subcuticular planes with vicryl sutures. I used dermabond and an occlusive bandage for a sterile dressing. PLAN OF CARE: Discharge to home after PACU  PATIENT DISPOSITION:  PACU - hemodynamically stable.   Delay start of Pharmacological VTE agent (>24hrs) due to surgical blood loss or risk of bleeding:  yes

## 2015-10-04 NOTE — Discharge Instructions (Signed)
You may shower tomorrow. Remove the honeycomb dressing on Friday.  Call if your temperature is greater than 101.5, if the wound becomes red or drains purulent fluid. You will receive the results of your biopsy from the neurologist.

## 2015-10-04 NOTE — H&P (Signed)
[name] is an 64 y.o. female.   Chief Complaint: weakness, unknown etiology HPI: [name] developed weakness this spring. She has undergone multiple evaluations and is now will be undergoing a muscle biopsy to aid in diagnosis  Past Medical History  Diagnosis Date  . Chronic bronchitis   . Frequency of urination   . Urgency of urination   . Stress incontinence, female   . Nocturia   . Chronic pain syndrome   . History of glaucoma STATES TREATED FOR ABOUT 5 YRS  AGO  --- NO ISSUES SINCE  . Ankle edema BILATERAL    takes Lasix daily  . Hyperlipidemia     was on meds but has been off x 2 months  . H/O hiatal hernia   . Fibromyalgia   . Arthritis     BACK, KNEE, HIPS  . COPD (chronic obstructive pulmonary disease) (HCC) CHRONIC BRONCHITIS --  LAST BOUT  BILATERAL PNEUMONIA 2009    uses QVAR daily and Albuterol daily as needed  . Hypertension     takes Atenlol and Triamtere  daily   . Diabetes mellitus without complication ([place])     takes Glipizide and Metformin daily  . Asthma   . Seasonal allergies     takes Zyrtec daily and uses Flonase daily as needed  . Pneumonia 2010    hx of  . Headache     frequently but related to sinus  . GERD (gastroesophageal reflux disease)     takes Nexium daily   . Iron deficiency anemia     takes Iron pill daily  . Peripheral neuropathy (HCC)     takes Gabapentin daily  . Joint pain   . Joint swelling   . Chronic back pain     d/t injury in 1995  . Hepatitis     hx of.Not sure if A,B,C  . History of colon polyps     benign  . Diverticulosis   . History of gastric ulcer   . History of blood transfusion     no abnormal reaction noted  . Vitamin D deficiency     takes Vit D daily  . Cataracts, bilateral     immature  . PTSD (post-traumatic stress disorder)     hx of-no meds    Past Surgical History  Procedure Laterality Date  . Bilateral benign breast tumor removed  YRS AGO  . Tubal ligation  YRS AGO  . Cysto with  hydrodistension  01/07/2012    Procedure: CYSTOSCOPY/HYDRODISTENSION;  Surgeon: Reece Packer, MD;  Location: [facility];  Service: Urology;  Laterality: N/A;   OF BLADDER WITH INSTILLATION OF MARCAINE AND PRYDIUM  . Neck cyst    . Breast surgery      2 cyst removed from right breast and tumor removed from left breast  . Cardiac catheterization  09-09-2008    NORMAL CORONARY ARTERIES/ NORMAL LVSF/ CHRONIC RENAL INSUFF.  . Colonoscopy      Family History  Problem Relation Age of Onset  . Cancer Mother   . Diabetes Mother   . Hypertension Mother   . Cancer Sister   . Hypertension Sister   . Hypertension Son    Social History:  reports that she has quit smoking. Her smoking use included Cigarettes. She has a 27 pack-year smoking history. She has never used smokeless tobacco. She reports that she does not drink alcohol or use illicit drugs.  Allergies:  Allergies  Allergen Reactions  .  Avapro [Irbesartan] Other (See Comments)    ANGIOEDEMA  . Lisinopril Other (See Comments)    ANGIOEDEMA  . Latex Swelling and Rash    Medications Prior to Admission  Medication Sig Dispense Refill  . albuterol (PROVENTIL HFA;VENTOLIN HFA) 108 (90 BASE) MCG/ACT inhaler Inhale 2 puffs into the lungs every 6 (six) hours as needed for wheezing or shortness of breath. Shortness of breath    . aspirin-acetaminophen-caffeine (EXCEDRIN EXTRA STRENGTH) 250-250-65 MG per tablet Take 2 tablets by mouth every 6 (six) hours as needed for pain.    Marland Kitchen atenolol (TENORMIN) 50 MG tablet Take 75 mg by mouth daily.    . beclomethasone (QVAR) 80 MCG/ACT inhaler Inhale 2 puffs into the lungs 2 (two) times daily.    . cetirizine (ZYRTEC) 10 MG tablet Take 10 mg by mouth daily.    . Cholecalciferol (VITAMIN D) 2000 UNITS CAPS Take 2,000 Units by mouth daily.    Marland Kitchen esomeprazole (NEXIUM) 40 MG capsule Take 40 mg by mouth daily before breakfast.    . Ferrous Gluconate (IRON) 240 (27 FE) MG TABS Take 240 mg  by mouth daily.     . fluticasone (FLONASE) 50 MCG/ACT nasal spray Place 2 sprays into the nose as needed for allergies. congestion    . furosemide (LASIX) 40 MG tablet Take 40 mg by mouth daily.     Marland Kitchen gabapentin (NEURONTIN) 600 MG tablet Take 600 mg by mouth 3 (three) times daily.    Marland Kitchen glipiZIDE (GLUCOTROL XL) 10 MG 24 hr tablet Take 10 mg by mouth daily. Before main meal    . ibuprofen (ADVIL,MOTRIN) 600 MG tablet Take 600 mg by mouth every 6 (six) hours as needed for headache.    . metFORMIN (GLUCOPHAGE) 850 MG tablet Take 850 mg by mouth 2 (two) times daily with a meal.    . methocarbamol (ROBAXIN) 750 MG tablet Take 750 mg by mouth every 8 (eight) hours.     Marland Kitchen oxycodone (ROXICODONE) 30 MG immediate release tablet Take 30 mg by mouth every 4 (four) hours as needed for pain. For pain    . triamterene-hydrochlorothiazide (MAXZIDE-25) 37.5-25 MG per tablet Take 1 tablet by mouth daily.     . VOLTAREN 1 % GEL Apply 1 application topically 3 (three) times daily as needed. Pain. Apply to knees, hands, and ankles.  0    Results for orders placed or performed during the hospital encounter of 10/04/15 (from the past 48 hour(s))  Glucose, capillary     Status: Abnormal   Collection Time: 10/04/15  1:32 PM  Result Value Ref Range   Glucose-Capillary 108 (H) 65 - 99 mg/dL  Comprehensive metabolic panel     Status: Abnormal   Collection Time: 10/04/15  1:44 PM  Result Value Ref Range   Sodium 137 135 - 145 mmol/L   Potassium 3.5 3.5 - 5.1 mmol/L   Chloride 99 (L) 101 - 111 mmol/L   CO2 29 22 - 32 mmol/L   Glucose, Bld 118 (H) 65 - 99 mg/dL   BUN 15 6 - 20 mg/dL   Creatinine, Ser 0.66 0.44 - 1.00 mg/dL   Calcium 9.1 8.9 - 10.3 mg/dL   Total Protein 6.7 6.5 - 8.1 g/dL   Albumin 3.6 3.5 - 5.0 g/dL   AST 21 15 - 41 U/L   ALT 15 14 - 54 U/L   Alkaline Phosphatase 64 38 - 126 U/L   Total Bilirubin 0.5 0.3 - 1.2 mg/dL   GFR  calc non Af Amer >60 >60 mL/min   GFR calc Af Amer >60 >60 mL/min     Comment: (NOTE) The eGFR has been calculated using the CKD EPI equation. This calculation has not been validated in all clinical situations. eGFR's persistently <60 mL/min signify possible Chronic Kidney Disease.    Anion gap 9 5 - 15  CBC     Status: Abnormal   Collection Time: 10/04/15  1:44 PM  Result Value Ref Range   WBC 4.3 4.0 - 10.5 K/uL   RBC 3.67 (L) 3.87 - 5.11 MIL/uL   Hemoglobin 10.4 (L) 12.0 - 15.0 g/dL   HCT 31.7 (L) 36.0 - 46.0 %   MCV 86.4 78.0 - 100.0 fL   MCH 28.3 26.0 - 34.0 pg   MCHC 32.8 30.0 - 36.0 g/dL   RDW 14.7 11.5 - 15.5 %   Platelets 176 150 - 400 K/uL   No results found.  Review of Systems  HENT: Negative.   Eyes: Negative.   Respiratory: Negative.   Cardiovascular: Negative.   Gastrointestinal: Negative.   Genitourinary: Negative.   Musculoskeletal: Negative.   Skin: Negative.   Neurological: Positive for focal weakness and weakness.  Endo/Heme/Allergies: Negative.   Psychiatric/Behavioral: Negative.     Blood pressure 143/84, pulse 65, temperature 98 F (36.7 C), temperature source Oral, resp. rate 20, height '5\' 10"'  (1.778 m), weight 127.007 kg (280 lb), SpO2 100 %. Physical Exam  Constitutional: She is oriented to person, place, and time. She appears well-developed and well-nourished. No distress.  HENT:  Head: Normocephalic and atraumatic.  Right Ear: External ear normal.  Left Ear: External ear normal.  Nose: Nose normal.  Mouth/Throat: Oropharynx is clear and moist.  Eyes: Conjunctivae and EOM are normal. Pupils are equal, round, and reactive to light.  Neck: Normal range of motion.  Cardiovascular: Normal rate, regular rhythm, normal heart sounds and intact distal pulses.   Respiratory: Effort normal and breath sounds normal.  GI: Soft. Bowel sounds are normal.  Musculoskeletal: She exhibits no edema or tenderness.  Neurological: She is alert and oriented to person, place, and time. She has normal reflexes. She displays normal  reflexes. No cranial nerve deficit. She exhibits normal muscle tone. Coordination normal.  Skin: Skin is warm and dry. She is not diaphoretic.  Psychiatric: She has a normal mood and affect. Her behavior is normal. Judgment and thought content normal.     Assessment/Plan OR for muscle biopsy.   Kindle Strohmeier L 10/04/2015, 4:31 PM

## 2015-10-04 NOTE — Anesthesia Preprocedure Evaluation (Addendum)
Anesthesia Evaluation  Patient identified by MRN, date of birth, ID band Patient awake    Reviewed: Allergy & Precautions, H&P , NPO status , Patient's Chart, lab work & pertinent test results, reviewed documented beta blocker date and time   Airway Mallampati: II  TM Distance: >3 FB Neck ROM: Full    Dental no notable dental hx. (+) Teeth Intact, Dental Advisory Given   Pulmonary asthma , COPD,  COPD inhaler, former smoker,    Pulmonary exam normal breath sounds clear to auscultation       Cardiovascular hypertension, Pt. on medications and Pt. on home beta blockers  Rhythm:Regular Rate:Normal     Neuro/Psych  Headaches, PSYCHIATRIC DISORDERS PTSD Neuromuscular disease    GI/Hepatic GERD  ,(+) Hepatitis -  Endo/Other  diabetes, Type 2, Oral Hypoglycemic AgentsMorbid obesity  Renal/GU Renal InsufficiencyRenal disease  negative genitourinary   Musculoskeletal  (+) Arthritis , Osteoarthritis,  Fibromyalgia -, narcotic dependent  Abdominal   Peds  Hematology negative hematology ROS (+) anemia ,   Anesthesia Other Findings   Reproductive/Obstetrics negative OB ROS                           Anesthesia Physical Anesthesia Plan  ASA: III  Anesthesia Plan: General   Post-op Pain Management:    Induction: Intravenous  Airway Management Planned: Oral ETT and LMA  Additional Equipment:   Intra-op Plan:   Post-operative Plan: Extubation in OR  Informed Consent: I have reviewed the patients History and Physical, chart, labs and discussed the procedure including the risks, benefits and alternatives for the proposed anesthesia with the patient or authorized representative who has indicated his/her understanding and acceptance.   Dental advisory given  Plan Discussed with: CRNA  Anesthesia Plan Comments:        Anesthesia Quick Evaluation

## 2015-10-04 NOTE — Transfer of Care (Signed)
Immediate Anesthesia Transfer of Care Note  Patient: [name]  Procedure(s) Performed: Procedure(s) with comments: LEFT Vastus Lateralis Muscle Biopsy (Left) - LEFT vastus lateralis muscle biopsy  Patient Location: PACU  Anesthesia Type:General  Level of Consciousness: awake and patient cooperative  Airway & Oxygen Therapy: Patient Spontanous Breathing and Patient connected to nasal cannula oxygen  Post-op Assessment: Report given to RN, Post -op Vital signs reviewed and stable and Patient moving all extremities  Post vital signs: Reviewed and stable  Last Vitals:  Filed Vitals:   10/04/15 1825  BP: 137/91  Pulse: 74  Temp: 36.7 C  Resp: 15    Complications: No apparent anesthesia complications

## 2015-10-05 ENCOUNTER — Encounter (HOSPITAL_COMMUNITY): Payer: Self-pay | Admitting: Neurosurgery

## 2015-10-05 LAB — HEMOGLOBIN A1C
Hgb A1c MFr Bld: 7.2 % — ABNORMAL HIGH (ref 4.8–5.6)
Mean Plasma Glucose: 160 mg/dL

## 2015-10-05 NOTE — Anesthesia Postprocedure Evaluation (Signed)
  Anesthesia Post-op Note  Patient: [name]  Procedure(s) Performed: Procedure(s) with comments: LEFT Vastus Lateralis Muscle Biopsy (Left) - LEFT vastus lateralis muscle biopsy  Patient Location: PACU  Anesthesia Type:General  Level of Consciousness: awake  Airway and Oxygen Therapy: Patient Spontanous Breathing  Post-op Pain: mild  Post-op Assessment: Post-op Vital signs reviewed, Patient's Cardiovascular Status Stable, Respiratory Function Stable, Patent Airway, No signs of Nausea or vomiting and Pain level controlled              Post-op Vital Signs: Reviewed and stable  Last Vitals:  Filed Vitals:   10/04/15 1930  BP: 150/86  Pulse: 64  Temp:   Resp: 11    Complications: No apparent anesthesia complications

## 2015-10-13 ENCOUNTER — Encounter (HOSPITAL_COMMUNITY): Payer: Self-pay

## 2015-10-16 ENCOUNTER — Telehealth: Payer: Self-pay | Admitting: *Deleted

## 2015-10-16 NOTE — Telephone Encounter (Signed)
Spoke with patient and informed her Dr Leta Baptist ordered her muscle boipsy, so she will FU with him for results. She states she continues to have pain, has not heard about results. Scheduled FU this Thurs; she verbalized understanding, appreciation.

## 2015-10-19 ENCOUNTER — Ambulatory Visit: Payer: Self-pay | Admitting: Diagnostic Neuroimaging

## 2015-10-19 ENCOUNTER — Other Ambulatory Visit: Payer: Self-pay | Admitting: Diagnostic Neuroimaging

## 2015-10-19 DIAGNOSIS — G729 Myopathy, unspecified: Secondary | ICD-10-CM

## 2015-11-09 ENCOUNTER — Ambulatory Visit (HOSPITAL_BASED_OUTPATIENT_CLINIC_OR_DEPARTMENT_OTHER): Payer: Medicare Other

## 2016-02-06 ENCOUNTER — Emergency Department (HOSPITAL_COMMUNITY): Payer: Medicare Other

## 2016-02-06 ENCOUNTER — Emergency Department (HOSPITAL_COMMUNITY)
Admission: EM | Admit: 2016-02-06 | Discharge: 2016-02-07 | Disposition: A | Discharge status: Home / Self Care | Payer: Medicare Other | Attending: Emergency Medicine | Admitting: Emergency Medicine

## 2016-02-06 ENCOUNTER — Encounter (HOSPITAL_COMMUNITY): Payer: Self-pay | Admitting: Emergency Medicine

## 2016-02-06 DIAGNOSIS — M797 Fibromyalgia: Secondary | ICD-10-CM | POA: Insufficient documentation

## 2016-02-06 DIAGNOSIS — Z86010 Personal history of colonic polyps: Secondary | ICD-10-CM | POA: Diagnosis not present

## 2016-02-06 DIAGNOSIS — E559 Vitamin D deficiency, unspecified: Secondary | ICD-10-CM | POA: Diagnosis not present

## 2016-02-06 DIAGNOSIS — M170 Bilateral primary osteoarthritis of knee: Secondary | ICD-10-CM | POA: Diagnosis not present

## 2016-02-06 DIAGNOSIS — J029 Acute pharyngitis, unspecified: Secondary | ICD-10-CM | POA: Insufficient documentation

## 2016-02-06 DIAGNOSIS — Z7951 Long term (current) use of inhaled steroids: Secondary | ICD-10-CM | POA: Insufficient documentation

## 2016-02-06 DIAGNOSIS — E119 Type 2 diabetes mellitus without complications: Secondary | ICD-10-CM | POA: Insufficient documentation

## 2016-02-06 DIAGNOSIS — K219 Gastro-esophageal reflux disease without esophagitis: Secondary | ICD-10-CM | POA: Diagnosis not present

## 2016-02-06 DIAGNOSIS — N63 Unspecified lump in unspecified breast: Secondary | ICD-10-CM

## 2016-02-06 DIAGNOSIS — Z91040 Latex allergy status: Secondary | ICD-10-CM | POA: Diagnosis not present

## 2016-02-06 DIAGNOSIS — G629 Polyneuropathy, unspecified: Secondary | ICD-10-CM | POA: Diagnosis not present

## 2016-02-06 DIAGNOSIS — Z87891 Personal history of nicotine dependence: Secondary | ICD-10-CM | POA: Insufficient documentation

## 2016-02-06 DIAGNOSIS — Z79899 Other long term (current) drug therapy: Secondary | ICD-10-CM | POA: Diagnosis not present

## 2016-02-06 DIAGNOSIS — G894 Chronic pain syndrome: Secondary | ICD-10-CM | POA: Insufficient documentation

## 2016-02-06 DIAGNOSIS — Z8701 Personal history of pneumonia (recurrent): Secondary | ICD-10-CM | POA: Insufficient documentation

## 2016-02-06 DIAGNOSIS — Z7984 Long term (current) use of oral hypoglycemic drugs: Secondary | ICD-10-CM | POA: Insufficient documentation

## 2016-02-06 DIAGNOSIS — J449 Chronic obstructive pulmonary disease, unspecified: Secondary | ICD-10-CM | POA: Diagnosis not present

## 2016-02-06 DIAGNOSIS — I10 Essential (primary) hypertension: Secondary | ICD-10-CM | POA: Insufficient documentation

## 2016-02-06 DIAGNOSIS — Z859 Personal history of malignant neoplasm, unspecified: Secondary | ICD-10-CM | POA: Diagnosis not present

## 2016-02-06 DIAGNOSIS — D509 Iron deficiency anemia, unspecified: Secondary | ICD-10-CM | POA: Insufficient documentation

## 2016-02-06 HISTORY — DX: Malignant (primary) neoplasm, unspecified: C80.1

## 2016-02-06 LAB — COMPREHENSIVE METABOLIC PANEL
ALT: 15 U/L (ref 14–54)
AST: 19 U/L (ref 15–41)
Albumin: 4.3 g/dL (ref 3.5–5.0)
Alkaline Phosphatase: 69 U/L (ref 38–126)
Anion gap: 9 (ref 5–15)
BUN: 17 mg/dL (ref 6–20)
CALCIUM: 9.5 mg/dL (ref 8.9–10.3)
CHLORIDE: 102 mmol/L (ref 101–111)
CO2: 29 mmol/L (ref 22–32)
Creatinine, Ser: 0.71 mg/dL (ref 0.44–1.00)
GFR calc Af Amer: >60 mL/min (ref >60)
Glucose, Bld: 135 mg/dL — ABNORMAL HIGH (ref 65–99)
Potassium: 3.3 mmol/L — ABNORMAL LOW (ref 3.5–5.1)
Sodium: 140 mmol/L (ref 135–145)
Total Bilirubin: 0.5 mg/dL (ref 0.3–1.2)
Total Protein: 7.8 g/dL (ref 6.5–8.1)

## 2016-02-06 LAB — CBC WITH DIFFERENTIAL/PLATELET
BASOS ABS: 0 10*3/uL (ref 0.0–0.1)
Basophils Relative: 1 %
EOS PCT: 2 %
Eosinophils Absolute: 0.2 10*3/uL (ref 0.0–0.7)
HEMATOCRIT: 36.6 % (ref 36.0–46.0)
HEMOGLOBIN: 11.9 g/dL — AB (ref 12.0–15.0)
LYMPHS PCT: 31 %
Lymphs Abs: 2.7 10*3/uL (ref 0.7–4.0)
MCH: 28.3 pg (ref 26.0–34.0)
MCHC: 32.5 g/dL (ref 30.0–36.0)
MCV: 87.1 fL (ref 78.0–100.0)
Monocytes Absolute: 0.7 10*3/uL (ref 0.1–1.0)
Monocytes Relative: 8 %
NEUTROS ABS: 5.1 10*3/uL (ref 1.7–7.7)
NEUTROS PCT: 58 %
PLATELETS: 224 10*3/uL (ref 150–400)
RBC: 4.2 MIL/uL (ref 3.87–5.11)
RDW: 15.2 % (ref 11.5–15.5)
WBC: 8.6 10*3/uL (ref 4.0–10.5)

## 2016-02-06 LAB — RAPID STREP SCREEN (MED CTR MEBANE ONLY): STREPTOCOCCUS, GROUP A SCREEN (DIRECT): NEGATIVE

## 2016-02-06 MED ORDER — IOHEXOL 350 MG/ML SOLN
100.0000 mL | Freq: Once | INTRAVENOUS | Status: AC | PRN
  Administered 2016-02-06: 100 mL via INTRAVENOUS

## 2016-02-06 NOTE — ED Notes (Signed)
Hx of breast tumor. Woke up this morning with a mass to right chest wall that is tender to touch, warm, about the size of a 50 cent piece. Also c/o sore throat

## 2016-02-07 DIAGNOSIS — N63 Unspecified lump in breast: Secondary | ICD-10-CM | POA: Diagnosis not present

## 2016-02-07 MED ORDER — CEPHALEXIN 500 MG PO CAPS
500.0000 mg | ORAL_CAPSULE | Freq: Once | ORAL | Status: AC
  Administered 2016-02-07: 500 mg via ORAL
  Filled 2016-02-07: qty 1

## 2016-02-07 MED ORDER — DOXYCYCLINE HYCLATE 100 MG PO TABS
100.0000 mg | ORAL_TABLET | Freq: Once | ORAL | Status: AC
  Administered 2016-02-07: 100 mg via ORAL
  Filled 2016-02-07: qty 1

## 2016-02-07 MED ORDER — CEPHALEXIN 500 MG PO CAPS: 500.0000 mg | ORAL_CAPSULE | Freq: Two times a day (BID) | ORAL | Status: DC

## 2016-02-07 MED ORDER — DOXYCYCLINE HYCLATE 100 MG PO CAPS: 100.0000 mg | ORAL_CAPSULE | Freq: Two times a day (BID) | ORAL | Status: DC

## 2016-02-07 NOTE — Discharge Instructions (Signed)
Breast Self-Awareness Ms. [name], you need to see your primary care doctor within 3 days for close follow up.  You need to have a mammogram done to be sure this is not cancer.  Take antibiotics as well and monitor for improvement. If symptoms worsen, come back to the ED immediately.  Thank you. Breast self-awareness allows you to notice a breast problem early while it is still small. Do a breast self-exam:  Every month, 5-7 days after your period (menstrual period).  At the same time each month if you do not have periods anymore. Look for any:  Difference between your breasts (size, shape, or position).  Change in breast shape or size.  Fluid or blood coming from your nipples.  Changes in your nipples (dimpling, nipple movement).   Change in skin color or texture (redness, scaly areas). Feel for:  Lumps.  Bumps.  Dips.  Any other changes. HOW TO DO A BREAST SELF-EXAM Look at your breasts and nipples. 1. Take off all your clothes above your waist. 2. Stand in front of a mirror in a room with good lighting. 3. Put your hands on your hips and push your hands downward. Feel your breasts.  1. Lie flat on your back or stand in the shower or tub. If you are in the shower or tub, have wet, soapy hands. 2. Place your right arm above your head. 3. Place your left hand in the right underarm area. 4. Make small circles using the pads (not the fingertips) of your 3 middle fingers. Press lightly and then with medium and firm pressure. 5. Move your fingers a little lower and make the small circles at the 3 pressures (light, medium, and firm). 6. Continue moving your fingers lower and making circles until you reach the bottom of your breast. 7. Move your fingers one finger-width towards the center of the body. 8. Continue making the circles, this time moving upward until you reach the bottom of your neck. 9. Move your fingers one finger-width towards the center of your body. 10. Make circles  downward when starting at the bottom of the neck. Make circles upward when starting at the bottom of the breast. Stop when you reach the middle of the chest. 11.  Repeat these steps on the other breast. Write down what looks and feels normal for each breast. Also write down any changes you notice. GET HELP RIGHT AWAY IF:  You see any changes in your breasts or nipples.  You see skin changes.  You have unusual discharge from your nipples.  You feel a new lump.  You feel unusually thick areas.   This information is not intended to replace advice given to you by your health care provider. Make sure you discuss any questions you have with your health care provider.   Document Released: 05/27/2008 Document Revised: 11/25/2012 Document Reviewed: 03/25/2012 Elsevier Interactive Patient Education 2016 [place] A mammogram is an X-ray of the breasts that is done to check for changes that are not normal. This test can screen for and find any changes that may suggest breast cancer. This test can also help to find other changes and variations in the breast. BEFORE THE PROCEDURE  Have this test done about 1-2 weeks after your period. This is usually when your breasts are the least tender.  If you are visiting a new doctor or clinic, send any past mammogram images to your new doctor's office.  Wash your breasts and under your arms the  day of the test.  Do not use deodorants, perfumes, lotions, or powders on the day of the test.  Take off any jewelry from your neck.  Wear clothes that you can change into and out of easily. PROCEDURE  You will undress from the waist up. You will put on a gown.  You will stand in front of the X-ray machine.  Each breast will be placed between two plastic or glass plates. The plates will press down on your breast for a few seconds. Try to stay as relaxed as possible. This does not cause any harm to your breasts. Any discomfort you feel will be  very brief.  X-rays will be taken from different angles of each breast. The procedure may vary among doctors and hospitals.  AFTER THE PROCEDURE  The mammogram will be looked at by a specialist (radiologist).  You may need to do certain parts of the test again. This depends on the quality of the images.  Ask when your test results will be ready. Make sure you get your test results.  You may go back to your normal activities.   This information is not intended to replace advice given to you by your health care provider. Make sure you discuss any questions you have with your health care provider.   Document Released: 03/07/2009 Document Revised: 11/28/2011 Document Reviewed: 02/17/2015 Elsevier Interactive Patient Education [employer].

## 2016-02-07 NOTE — ED Provider Notes (Signed)
CSN: [id number]     Arrival date & time 02/06/16  1529 History   By signing my name below, I, Rowan Blase, attest that this documentation has been prepared under the direction and in the presence of Everlene Balls, MD . Electronically Signed: Rowan Blase, Scribe. 02/07/2016. 1:45 AM.   Chief Complaint  Patient presents with  . Breast Mass  . Sore Throat    The history is provided by the patient. No language interpreter was used.   HPI Comments:  [name] is a 65 y.o. female with PMHx of breast tumor, HLD, COPD, HTN, and DM who presents to the Emergency Department complaining of swollen ~3 cm mass in right chest wall. She reports associated mild, worsening, right chest wall pain onset two days ago. Pt notes pain is worse with movement. No alleviating factors noted. Pt states she has had a cyst in her left breast removed previously. Pt has no other symptoms or complaints at this time.  Past Medical History  Diagnosis Date  . Chronic bronchitis   . Frequency of urination   . Urgency of urination   . Stress incontinence, female   . Nocturia   . Chronic pain syndrome   . History of glaucoma STATES TREATED FOR ABOUT 5 YRS  AGO  --- NO ISSUES SINCE  . Ankle edema BILATERAL    takes Lasix daily  . Hyperlipidemia     was on meds but has been off x 2 months  . H/O hiatal hernia   . Fibromyalgia   . Arthritis     BACK, KNEE, HIPS  . COPD (chronic obstructive pulmonary disease) (HCC) CHRONIC BRONCHITIS --  LAST BOUT  BILATERAL PNEUMONIA 2009    uses QVAR daily and Albuterol daily as needed  . Hypertension     takes Atenlol and Triamtere  daily   . Diabetes mellitus without complication ([place])     takes Glipizide and Metformin daily  . Asthma   . Seasonal allergies     takes Zyrtec daily and uses Flonase daily as needed  . Pneumonia 2010    hx of  . Headache     frequently but related to sinus  . GERD (gastroesophageal reflux disease)     takes Nexium daily   . Iron  deficiency anemia     takes Iron pill daily  . Peripheral neuropathy (HCC)     takes Gabapentin daily  . Joint pain   . Joint swelling   . Chronic back pain     d/t injury in 1995  . Hepatitis     hx of.Not sure if A,B,C  . History of colon polyps     benign  . Diverticulosis   . History of gastric ulcer   . History of blood transfusion     no abnormal reaction noted  . Vitamin D deficiency     takes Vit D daily  . Cataracts, bilateral     immature  . PTSD (post-traumatic stress disorder)     hx of-no meds  . Cancer [facility])    Past Surgical History  Procedure Laterality Date  . Bilateral benign breast tumor removed  YRS AGO  . Tubal ligation  YRS AGO  . Cysto with hydrodistension  01/07/2012    Procedure: CYSTOSCOPY/HYDRODISTENSION;  Surgeon: Reece Packer, MD;  Location: [facility];  Service: Urology;  Laterality: N/A;   OF BLADDER WITH INSTILLATION OF MARCAINE AND PRYDIUM  . Neck cyst    .  Breast surgery      2 cyst removed from right breast and tumor removed from left breast  . Cardiac catheterization  09-09-2008    NORMAL CORONARY ARTERIES/ NORMAL LVSF/ CHRONIC RENAL INSUFF.  . Colonoscopy    . Muscle biopsy Left 10/04/2015    Procedure: LEFT Vastus Lateralis Muscle Biopsy;  Surgeon: Ashok Pall, MD;  Location: [facility];  Service: Neurosurgery;  Laterality: Left;  LEFT vastus lateralis muscle biopsy   Family History  Problem Relation Age of Onset  . Cancer Mother   . Diabetes Mother   . Hypertension Mother   . Cancer Sister   . Hypertension Sister   . Hypertension Son    Social History  Substance Use Topics  . Smoking status: Former Smoker -- 1.00 packs/day for 27 years    Types: Cigarettes  . Smokeless tobacco: Never Used     Comment: quit smoking in 1993  . Alcohol Use: No     Comment: quit drinking 25+yrs ago   OB History    No data available     Review of Systems  Constitutional: Negative for fever.  Musculoskeletal:  Positive for joint swelling and arthralgias.  Skin:       Mass above right breast  All other systems reviewed and are negative.  Allergies  Avapro; Lisinopril; and Latex  Home Medications   Prior to Admission medications   Medication Sig Start Date End Date Taking? Authorizing Provider  albuterol (PROVENTIL HFA;VENTOLIN HFA) 108 (90 BASE) MCG/ACT inhaler Inhale 2 puffs into the lungs every 6 (six) hours as needed for wheezing or shortness of breath. Shortness of breath    Historical Provider, MD  aspirin-acetaminophen-caffeine (EXCEDRIN EXTRA STRENGTH) [phone] MG per tablet Take 2 tablets by mouth every 6 (six) hours as needed for pain.    Historical Provider, MD  atenolol (TENORMIN) 50 MG tablet Take 75 mg by mouth daily.    Historical Provider, MD  beclomethasone (QVAR) 80 MCG/ACT inhaler Inhale 2 puffs into the lungs 2 (two) times daily.    Historical Provider, MD  cetirizine (ZYRTEC) 10 MG tablet Take 10 mg by mouth daily.    Historical Provider, MD  Cholecalciferol (VITAMIN D) 2000 UNITS CAPS Take 2,000 Units by mouth daily.    Historical Provider, MD  esomeprazole (NEXIUM) 40 MG capsule Take 40 mg by mouth daily before breakfast.    Historical Provider, MD  Ferrous Gluconate (IRON) 240 (27 FE) MG TABS Take 240 mg by mouth daily.     Historical Provider, MD  fluticasone (FLONASE) 50 MCG/ACT nasal spray Place 2 sprays into the nose as needed for allergies. congestion    Historical Provider, MD  furosemide (LASIX) 40 MG tablet Take 40 mg by mouth daily.     Historical Provider, MD  gabapentin (NEURONTIN) 600 MG tablet Take 600 mg by mouth 3 (three) times daily.    Historical Provider, MD  glipiZIDE (GLUCOTROL XL) 10 MG 24 hr tablet Take 10 mg by mouth daily. Before main meal    Historical Provider, MD  HYDROcodone-acetaminophen (NORCO/VICODIN) 5-325 MG tablet Take 1 tablet by mouth every 6 (six) hours as needed for moderate pain. 10/04/15   Ashok Pall, MD  ibuprofen (ADVIL,MOTRIN)  600 MG tablet Take 600 mg by mouth every 6 (six) hours as needed for headache.    Historical Provider, MD  metFORMIN (GLUCOPHAGE) 850 MG tablet Take 850 mg by mouth 2 (two) times daily with a meal.    Historical Provider, MD  methocarbamol (  ROBAXIN) 750 MG tablet Take 750 mg by mouth every 8 (eight) hours.     Historical Provider, MD  oxycodone (ROXICODONE) 30 MG immediate release tablet Take 30 mg by mouth every 4 (four) hours as needed for pain. For pain    Historical Provider, MD  triamterene-hydrochlorothiazide (MAXZIDE-25) 37.5-25 MG per tablet Take 1 tablet by mouth daily.  01/13/14   Historical Provider, MD  VOLTAREN 1 % GEL Apply 1 application topically 3 (three) times daily as needed. Pain. Apply to knees, hands, and ankles. 02/10/15   Historical Provider, MD   BP 174/109 mmHg  Pulse 72  Temp(Src) 98.4 F (36.9 C) (Oral)  Resp 18  SpO2 96% Physical Exam  Constitutional: She is oriented to person, place, and time. She appears well-developed and well-nourished. No distress.  HENT:  Head: Normocephalic and atraumatic.  Nose: Nose normal.  Mouth/Throat: Oropharynx is clear and moist. No oropharyngeal exudate.  Eyes: Conjunctivae and EOM are normal. Pupils are equal, round, and reactive to light. No scleral icterus.  Neck: Normal range of motion. Neck supple. No JVD present. No tracheal deviation present. No thyromegaly present.  Cardiovascular: Normal rate, regular rhythm and normal heart sounds.  Exam reveals no gallop and no friction rub.   No murmur heard. Pulmonary/Chest: Effort normal and breath sounds normal. No respiratory distress. She has no wheezes. She exhibits no tenderness.  Abdominal: Soft. Bowel sounds are normal. She exhibits no distension and no mass. There is no tenderness. There is no rebound and no guarding.  Musculoskeletal: Normal range of motion. She exhibits no edema or tenderness.  Lymphadenopathy:    She has no cervical adenopathy.  Neurological: She is alert  and oriented to person, place, and time. No cranial nerve deficit. She exhibits normal muscle tone.  Skin: Skin is warm and dry. No erythema. No pallor.  3cm hard palpable mass over right anterior second rib; mild TTP; non-mobile; no overlying erythema, drainage, or warmth   Nursing note and vitals reviewed.   ED Course  Procedures  DIAGNOSTIC STUDIES:  Oxygen Saturation is 96% on RA, normal by my interpretation.    COORDINATION OF CARE:  2:02 AM Will review imaging. Recommended PCP follow-up. Discussed treatment plan with pt at bedside and pt agreed to plan.  Labs Review Labs Reviewed  CBC WITH DIFFERENTIAL/PLATELET - Abnormal; Notable for the following:    Hemoglobin 11.9 (*)    All other components within normal limits  COMPREHENSIVE METABOLIC PANEL - Abnormal; Notable for the following:    Potassium 3.3 (*)    Glucose, Bld 135 (*)    All other components within normal limits  RAPID STREP SCREEN (NOT AT [facility])  CULTURE, GROUP A STREP [facility])    Imaging Review Ct Angio Chest Pe W/cm &/or Wo Cm  02/06/2016  CLINICAL DATA:  65 year old female with new onset right chest wall mass. EXAM: CT ANGIOGRAPHY CHEST WITH CONTRAST TECHNIQUE: Multidetector CT imaging of the chest was performed using the standard protocol during bolus administration of intravenous contrast. Multiplanar CT image reconstructions and MIPs were obtained to evaluate the vascular anatomy. CONTRAST:  110mL OMNIPAQUE IOHEXOL 350 MG/ML SOLN COMPARISON:  prior MRI thoracic spine 03/09/2015 FINDINGS: Mediastinum: Unremarkable CT appearance of the thyroid gland. No suspicious mediastinal or hilar adenopathy. No soft tissue mediastinal mass. The thoracic esophagus is unremarkable. Heart/Vascular: Adequate opacification of the pulmonary arteries to the proximal subsegmental level. No central filling defect to suggest acute pulmonary embolus. Two vessel aortic arch. The right brachiocephalic and left  common carotid artery share a  common origin. Atherosclerotic calcification along the transverse aorta. No dissection. Ectasia bordering on aneurysmal dilatation of the ascending thoracic aorta with a maximal diameter of 4.1 cm. Mild cardiomegaly. No pericardial effusion. Lungs/Pleura: The lungs are clear. Minimal dependent atelectasis in lower lobes. Mild lower lobe bronchial wall thickening. Bones/Soft Tissues: No acute fracture or aggressive appearing lytic or blastic osseous lesion. Upper Abdomen: Visualized upper abdominal organs are unremarkable. Review of the MIP images confirms the above findings. IMPRESSION: 1. Negative for acute pulmonary embolus, pneumonia or other acute cardiopulmonary process. No right chest wall mass is visualized although the most lateral aspect of the right chest wall is incompletely imaged. 2. Mild aneurysmal dilatation of the ascending thoracic aorta with a maximal transverse diameter of 4.1 cm. Recommend annual imaging followup by CTA or MRA. This recommendation follows 2010 ACCF/AHA/AATS/ACR/ASA/SCA/SCAI/SIR/STS/SVM Guidelines for the Diagnosis and Management of Patients with Thoracic Aortic Disease. Circulation. 2010; 121: [id number] 3. Mild cardiomegaly. 4. Mild lower lobe bronchial wall thickening and trace dependent atelectasis. Electronically Signed   By: Jacqulynn Cadet M.D.   On: 02/06/2016 21:13   I have personally reviewed and evaluated these images and lab results as part of my medical decision-making.   EKG Interpretation None      MDM   Final diagnoses:  None   Patient presents to the ED for mass on anterior chest well.  CT scan does not visualize any abnormalities.  This may be a sebaceous cyst that got infected.  Much to early to I&D.  Will DC home with keflex and doxy for treatment and PCP fu within 3 days.  She will need a repeat mamogram as well.  She demonstrates good understanding.  She appears well and in NAD.  VS remain within her normal limits and she is safe for  DC.    I personally performed the services described in this documentation, which was scribed in my presence. The recorded information has been reviewed and is accurate.     Everlene Balls, MD 02/07/16 [phone]

## 2016-02-09 LAB — CULTURE, GROUP A STREP (THRC)

## 2016-02-12 ENCOUNTER — Other Ambulatory Visit: Payer: Self-pay | Admitting: Internal Medicine

## 2016-02-12 DIAGNOSIS — N6310 Unspecified lump in the right breast, unspecified quadrant: Secondary | ICD-10-CM

## 2016-02-14 ENCOUNTER — Ambulatory Visit
Admission: RE | Admit: 2016-02-14 | Discharge: 2016-02-14 | Disposition: A | Discharge status: Home / Self Care | Payer: Medicare Other | Source: Ambulatory Visit | Attending: Internal Medicine | Admitting: Internal Medicine

## 2016-02-14 DIAGNOSIS — N6310 Unspecified lump in the right breast, unspecified quadrant: Secondary | ICD-10-CM

## 2016-02-15 ENCOUNTER — Other Ambulatory Visit: Payer: Medicare Other

## 2016-02-16 ENCOUNTER — Other Ambulatory Visit: Payer: Self-pay | Admitting: Anesthesiology

## 2016-02-16 ENCOUNTER — Ambulatory Visit
Admission: RE | Admit: 2016-02-16 | Discharge: 2016-02-16 | Disposition: A | Discharge status: Home / Self Care | Payer: Medicare Other | Source: Ambulatory Visit | Attending: Anesthesiology | Admitting: Anesthesiology

## 2016-02-16 DIAGNOSIS — M545 Low back pain: Secondary | ICD-10-CM

## 2016-10-03 ENCOUNTER — Ambulatory Visit: Payer: Medicare Other | Admitting: Physical Therapy

## 2016-10-04 ENCOUNTER — Ambulatory Visit: Payer: Medicare Other | Admitting: Physical Therapy

## 2016-10-14 ENCOUNTER — Encounter: Payer: Self-pay | Admitting: Physical Therapy

## 2016-10-14 ENCOUNTER — Ambulatory Visit: Payer: Medicare Other | Attending: Internal Medicine | Admitting: Physical Therapy

## 2016-10-14 DIAGNOSIS — M6281 Muscle weakness (generalized): Secondary | ICD-10-CM | POA: Diagnosis present

## 2016-10-14 DIAGNOSIS — R262 Difficulty in walking, not elsewhere classified: Secondary | ICD-10-CM | POA: Diagnosis present

## 2016-10-14 DIAGNOSIS — M25561 Pain in right knee: Secondary | ICD-10-CM | POA: Diagnosis not present

## 2016-10-14 DIAGNOSIS — G8929 Other chronic pain: Secondary | ICD-10-CM | POA: Insufficient documentation

## 2016-10-14 NOTE — Therapy (Signed)
[place], [place], [address] Phone: [phone]   Fax:  [phone]  Physical Therapy Evaluation  Patient Details  Name: [name] MRN: [id number] Date of Birth: September 13, 1951 Referring Provider: Elwyn Reach, MD  Encounter Date: 10/14/2016      PT End of Session - 10/14/16 1206    Visit Number 1   Number of Visits 13   Date for PT Re-Evaluation 11/11/16  30d MCR re-eval, POC through 11/22/16   Authorization Type MCR- KX at visit 15   PT Start Time 1150  pt arrived late   PT Stop Time 1232   PT Time Calculation (min) 42 min   Activity Tolerance Patient tolerated treatment well   Behavior During Therapy [facility] for tasks assessed/performed      Past Medical History:  Diagnosis Date  . Ankle edema BILATERAL   takes Lasix daily  . Arthritis    BACK, KNEE, HIPS  . Asthma   . Cancer ([place])   . Cataracts, bilateral    immature  . Chronic back pain    d/t injury in 1995  . Chronic bronchitis   . Chronic pain syndrome   . COPD (chronic obstructive pulmonary disease) (HCC) CHRONIC BRONCHITIS --  LAST BOUT  BILATERAL PNEUMONIA 2009   uses QVAR daily and Albuterol daily as needed  . Diabetes mellitus without complication ([place])    takes Glipizide and Metformin daily  . Diverticulosis   . Fibromyalgia   . Frequency of urination   . GERD (gastroesophageal reflux disease)    takes Nexium daily   . H/O hiatal hernia   . Headache    frequently but related to sinus  . Hepatitis    hx of.Not sure if A,B,C  . History of blood transfusion    no abnormal reaction noted  . History of colon polyps    benign  . History of gastric ulcer   . History of glaucoma STATES TREATED FOR ABOUT 5 YRS  AGO  --- NO ISSUES SINCE  . Hyperlipidemia    was on meds but has been off x 2 months  . Hypertension    takes Atenlol and Triamtere  daily   . Iron deficiency anemia    takes Iron pill daily  . Joint pain   .  Joint swelling   . Nocturia   . Peripheral neuropathy (HCC)    takes Gabapentin daily  . Pneumonia 2010   hx of  . PTSD (post-traumatic stress disorder)    hx of-no meds  . Seasonal allergies    takes Zyrtec daily and uses Flonase daily as needed  . Stress incontinence, female   . Urgency of urination   . Vitamin D deficiency    takes Vit D daily    Past Surgical History:  Procedure Laterality Date  . BILATERAL BENIGN BREAST TUMOR REMOVED  YRS AGO  . BREAST SURGERY     2 cyst removed from right breast and tumor removed from left breast  . CARDIAC CATHETERIZATION  09-09-2008   NORMAL CORONARY ARTERIES/ NORMAL LVSF/ CHRONIC RENAL INSUFF.  [facility] COLONOSCOPY    . CYSTO WITH HYDRODISTENSION  01/07/2012   Procedure: CYSTOSCOPY/HYDRODISTENSION;  Surgeon: Reece Packer, MD;  Location: [facility];  Service: Urology;  Laterality: N/A;   OF BLADDER WITH INSTILLATION OF MARCAINE AND PRYDIUM  . MUSCLE BIOPSY Left 10/04/2015   Procedure: LEFT Vastus Lateralis Muscle Biopsy;  Surgeon: Ashok Pall, MD;  Location:  [place];  Service: Neurosurgery;  Laterality: Left;  LEFT vastus lateralis muscle biopsy  . neck cyst    . TUBAL LIGATION  YRS AGO    There were no vitals filed for this visit.       Subjective Assessment - 10/14/16 1150    Subjective R leg became swollen and stayed for about a year and a half (began March 18,2016). Multiple procedures to determine cause were unsuccessful.    Patient Stated Goals decrease pain, standing to cook   Currently in Pain? Yes   Pain Score 8    Pain Location Knee   Pain Orientation Right;Posterior;Distal;Proximal   Pain Descriptors / Indicators --  deep, stabbing, charlie horse sensation   Pain Type Chronic pain   Pain Onset More than a month ago   Pain Frequency Constant   Aggravating Factors  extended standing/walking   Pain Relieving Factors topical pain cream, medications            [facility] PT Assessment - 10/14/16  0001      Assessment   Medical Diagnosis knee pain   Referring Provider Elwyn Reach, MD   Onset Date/Surgical Date 03/10/15   Hand Dominance Right   Next MD Visit not scheduled at this time   Prior Therapy no     Precautions   Precautions None   Precaution Comments history of CA     Restrictions   Weight Bearing Restrictions No     Balance Screen   Has the patient fallen in the past 6 months Yes   How many times? 1  multiple near falls     [place] Spouse/significant other;Other relatives   Home Access Stairs to enter   Entrance Stairs-Number of Steps <10   Entrance Stairs-Rails Can reach both   Home Layout One level   [place] - single point     Prior Function   Level of Independence Independent     Cognition   Overall Cognitive Status Within Functional Limits for tasks assessed     Observation/Other Assessments   Focus on Therapeutic Outcomes (FOTO)  26% ability     Posture/Postural Control   Posture Comments increased lumbar lordosis, anterior pelvic tilt      ROM / Strength   AROM / PROM / Strength Strength     Strength   Strength Assessment Site Hip;Knee;Ankle   Right/Left Hip Right   Right/Left Knee Right   Right Knee Flexion 3+/5  concordant pain   Right/Left Ankle Right   Right Ankle Inversion 4-/5   Right Ankle Eversion 4-/5     Palpation   Palpation comment TTP distal hamstrings/proximal gastroc     Ambulation/Gait   Gait Comments LLE varus, flat foot gait bilateral, slow cadence, short step length, lacking knee motion and trunk rotation                   [facility] Adult PT Treatment/Exercise - 10/14/16 0001      Exercises   Exercises Knee/Hip     Knee/Hip Exercises: Stretches   Active Hamstring Stretch Limitations 3x5 PF/DF; also in seated     Knee/Hip Exercises: Standing   Gait Training heel-toe, trunk rotation                PT  Education - 10/14/16 1251    Education provided Yes   Education Details anatomy of condition, POC, HEP exercise form/rationale, gait  pattern, aquatics   Person(s) Educated Patient   Methods Explanation;Demonstration;Tactile cues;Verbal cues   Comprehension Verbalized understanding;Returned demonstration;Verbal cues required;Tactile cues required          PT Short Term Goals - 10/14/16 1259      PT SHORT TERM GOAL #1   Title Pt will verbalize knowledge of use of aquatic exercises to improve LE strength and full body well being by 11/17   Baseline began educating at eval   Time 3   Period Weeks   Status New     PT SHORT TERM GOAL #2   Title Pt will be able to stand from a chair with minimal discomfort presented from knee   Baseline severe at eval   Time 3   Period Weeks   Status New     PT SHORT TERM GOAL #3   Title Pt will verbalize at least 30% improvement in ambulation ability during functional daily activies   Baseline severe pain at eval   Time 3   Period Weeks   Status New           PT Long Term Goals - 10/14/16 1301      PT LONG TERM GOAL #1   Title Pt will be able to climb stairs at home step over step and one hand use with minimal disruption from knee to improve safety and household mobility by 12/2   Baseline leads with L and uses cane at eval   Time 6   Period Weeks   Status New     PT LONG TERM GOAL #2   Title FOTO score to 40% ability to indicate significant improvement in functional ability   Baseline 26% ability at eval   Time 6   Period Weeks   Status New     PT LONG TERM GOAL #3   Title Pt will be able to stand to cook meals for her family R leg pain <=5/10 to decrease pain impacts on daily activities   Baseline unable to stand to finish cooking due to pain at eval   Time 6   Period Weeks   Status New     PT LONG TERM GOAL #4   Title Pt will be able to perform household chores and ambulate community distances without sensation of knee  "giving out" to improve safety and decrease fall risk   Baseline happens frequently throughout the day at eval   Time 6   Period Weeks   Status New               Plan - 10/14/16 1252    Clinical Impression Statement Pt presents to PT with complaints of R leg pain surrounding knee joint, primarily in posterior region. Pt has constant pain in all positions reporting head to toe pain due to fibromyalgia. Discussed benefits of aquatics with pt for full body exercise/rehab. Pt noted to have spasm of hamstring and gastroc/soleus complex when seated and walking due to maintained knee flexion and flat foot gait. Education on heel-toe improved full body movement in gait pattern. Past MRI shows DDD at L5/S1, unable to determine contribution from spinal level at this time. Pt will benefit from skilled PT in order to decrease peripheral muscular spasm to improve gait pattern and ability to exercise for wellness.    Rehab Potential Good   PT Frequency 2x / week   PT Duration 6 weeks   PT Treatment/Interventions ADLs/Self Care Home Management;Cryotherapy;Stair training;Gait training;Functional mobility training;Traction;Moist Heat;Therapeutic activities;Therapeutic exercise;Neuromuscular re-education;Patient/family  education;Passive range of motion;Manual techniques;Dry needling;Taping;Vasopneumatic Device   PT Next Visit Plan hip MMT, nu step, abdominal bracing   PT Home Exercise Plan active hamstring stretch seated and supine, heel-toe gait.    Consulted and Agree with Plan of Care Patient      Patient will benefit from skilled therapeutic intervention in order to improve the following deficits and impairments:  Abnormal gait, Difficulty walking, Increased muscle spasms, Decreased activity tolerance, Pain, Improper body mechanics, Decreased strength, Postural dysfunction  Visit Diagnosis: Chronic pain of right knee - Plan: PT plan of care cert/re-cert  Difficulty in walking, not elsewhere  classified - Plan: PT plan of care cert/re-cert  Muscle weakness (generalized) - Plan: PT plan of care cert/re-cert      G-Codes - [id number]    Functional Assessment Tool Used FOTO 74% disability (goal 60%), clinical judgement   Functional Limitation Mobility: Walking and moving around   Mobility: Walking and Moving Around Current Status [id number]) At least 80 percent but less than 100 percent impaired, limited or restricted   Mobility: Walking and Moving Around Goal Status [phone]) At least 60 percent but less than 80 percent impaired, limited or restricted       Problem List Patient Active Problem List   Diagnosis Date Noted  . Chest pain 01/18/2014  . Sarcoidosis ([place])   . Positive TB test   . History of Bell's palsy   . Pelvic pain in female   . Stress incontinence, female   . Nocturia   . History of glaucoma   . Uterine fibroid   . Hyperlipidemia   . COPD (chronic obstructive pulmonary disease) ([place])   . Iron deficiency anemia   . H/O hiatal hernia   . Fibromyalgia   . Arthritis   . Chronic renal insufficiency   . Diabetes mellitus without complication ([place])     Deundra Furber C. Jag Lenz PT, DPT 10/14/16 1:09 PM   [place] [facility] [address] [place], [place], [address] Phone: [phone]   Fax:  [phone]  Name: [name] MRN: [id number] Date of Birth: Nov 03, 1951

## 2016-10-16 ENCOUNTER — Ambulatory Visit: Payer: Medicare Other | Admitting: Physical Therapy

## 2016-10-16 ENCOUNTER — Encounter: Payer: Self-pay | Admitting: Physical Therapy

## 2016-10-16 DIAGNOSIS — M25561 Pain in right knee: Principal | ICD-10-CM

## 2016-10-16 DIAGNOSIS — R262 Difficulty in walking, not elsewhere classified: Secondary | ICD-10-CM

## 2016-10-16 DIAGNOSIS — G8929 Other chronic pain: Secondary | ICD-10-CM

## 2016-10-16 DIAGNOSIS — M6281 Muscle weakness (generalized): Secondary | ICD-10-CM

## 2016-10-16 NOTE — Therapy (Signed)
[place], [place], [address] Phone: [phone]   Fax:  [phone]  Physical Therapy Treatment  Patient Details  Name: [name] MRN: [id number] Date of Birth: 1951/10/15 Referring Provider: Elwyn Reach, MD  Encounter Date: 10/16/2016      PT End of Session - 10/16/16 1205    Visit Number 2   Number of Visits 13   Date for PT Re-Evaluation 11/11/16   Authorization Type MCR- KX at visit 15   PT Start Time [id number]  pt arrived late   PT Stop Time 1236   PT Time Calculation (min) 38 min   Activity Tolerance Patient tolerated treatment well   Behavior During Therapy [facility] for tasks assessed/performed      Past Medical History:  Diagnosis Date  . Ankle edema BILATERAL   takes Lasix daily  . Arthritis    BACK, KNEE, HIPS  . Asthma   . Cancer ([place])   . Cataracts, bilateral    immature  . Chronic back pain    d/t injury in 1995  . Chronic bronchitis   . Chronic pain syndrome   . COPD (chronic obstructive pulmonary disease) (HCC) CHRONIC BRONCHITIS --  LAST BOUT  BILATERAL PNEUMONIA 2009   uses QVAR daily and Albuterol daily as needed  . Diabetes mellitus without complication ([place])    takes Glipizide and Metformin daily  . Diverticulosis   . Fibromyalgia   . Frequency of urination   . GERD (gastroesophageal reflux disease)    takes Nexium daily   . H/O hiatal hernia   . Headache    frequently but related to sinus  . Hepatitis    hx of.Not sure if A,B,C  . History of blood transfusion    no abnormal reaction noted  . History of colon polyps    benign  . History of gastric ulcer   . History of glaucoma STATES TREATED FOR ABOUT 5 YRS  AGO  --- NO ISSUES SINCE  . Hyperlipidemia    was on meds but has been off x 2 months  . Hypertension    takes Atenlol and Triamtere  daily   . Iron deficiency anemia    takes Iron pill daily  . Joint pain   . Joint swelling   . Nocturia   .  Peripheral neuropathy (HCC)    takes Gabapentin daily  . Pneumonia 2010   hx of  . PTSD (post-traumatic stress disorder)    hx of-no meds  . Seasonal allergies    takes Zyrtec daily and uses Flonase daily as needed  . Stress incontinence, female   . Urgency of urination   . Vitamin D deficiency    takes Vit D daily    Past Surgical History:  Procedure Laterality Date  . BILATERAL BENIGN BREAST TUMOR REMOVED  YRS AGO  . BREAST SURGERY     2 cyst removed from right breast and tumor removed from left breast  . CARDIAC CATHETERIZATION  09-09-2008   NORMAL CORONARY ARTERIES/ NORMAL LVSF/ CHRONIC RENAL INSUFF.  Marland Kitchen COLONOSCOPY    . CYSTO WITH HYDRODISTENSION  01/07/2012   Procedure: CYSTOSCOPY/HYDRODISTENSION;  Surgeon: Reece Packer, MD;  Location: [facility];  Service: Urology;  Laterality: N/A;   OF BLADDER WITH INSTILLATION OF MARCAINE AND PRYDIUM  . MUSCLE BIOPSY Left 10/04/2015   Procedure: LEFT Vastus Lateralis Muscle Biopsy;  Surgeon: Ashok Pall, MD;  Location: [place] [facility];  Service: Neurosurgery;  Laterality: Left;  LEFT vastus lateralis muscle biopsy  . neck cyst    . TUBAL LIGATION  YRS AGO    There were no vitals filed for this visit.      Subjective Assessment - 10/16/16 1202    Subjective Pt reports she is feeling pretty good today. Did not have any more pain after eval, was conscious about heel toe gait.    Currently in Pain? Yes   Pain Score 7    Pain Location Knee   Pain Orientation Right;Posterior   Pain Descriptors / Indicators Spasm   Pain Type Chronic pain   Pain Frequency Constant            [facility] PT Assessment - 10/16/16 0001      Strength   Right/Left Hip Right   Right Hip Flexion 3+/5   Right Hip Extension 3-/5  unable to obtain full extension ROM   Right Hip ABduction 3/5  pain                     [facility] Adult PT Treatment/Exercise - 10/16/16 0001      Knee/Hip Exercises: Aerobic   Stationary Bike L2 5  min     Knee/Hip Exercises: Supine   Bridges Limitations ab set+ glut set+ bridge, in PF x15     Knee/Hip Exercises: Sidelying   Clams x15 each     Knee/Hip Exercises: Prone   Hip Extension Limitations prone glut sets 5s holds     Manual Therapy   Manual Therapy Soft tissue mobilization   Soft tissue mobilization roller R HS and gastroc/soleus                PT Education - 10/16/16 1204    Education provided Yes   Education Details exercise form/rationale   Person(s) Educated Patient   Methods Explanation;Demonstration;Tactile cues;Verbal cues   Comprehension Verbalized understanding;Returned demonstration;Verbal cues required;Tactile cues required;Need further instruction          PT Short Term Goals - 10/14/16 1259      PT SHORT TERM GOAL #1   Title Pt will verbalize knowledge of use of aquatic exercises to improve LE strength and full body well being by 11/17   Baseline began educating at eval   Time 3   Period Weeks   Status New     PT SHORT TERM GOAL #2   Title Pt will be able to stand from a chair with minimal discomfort presented from knee   Baseline severe at eval   Time 3   Period Weeks   Status New     PT SHORT TERM GOAL #3   Title Pt will verbalize at least 30% improvement in ambulation ability during functional daily activies   Baseline severe pain at eval   Time 3   Period Weeks   Status New           PT Long Term Goals - 10/14/16 1301      PT LONG TERM GOAL #1   Title Pt will be able to climb stairs at home step over step and one hand use with minimal disruption from knee to improve safety and household mobility by 12/2   Baseline leads with L and uses cane at eval   Time 6   Period Weeks   Status New     PT LONG TERM GOAL #2   Title FOTO score to 40% ability to indicate significant improvement in functional ability   Baseline  26% ability at eval   Time 6   Period Weeks   Status New     PT LONG TERM GOAL #3   Title Pt will  be able to stand to cook meals for her family R leg pain <=5/10 to decrease pain impacts on daily activities   Baseline unable to stand to finish cooking due to pain at eval   Time 6   Period Weeks   Status New     PT LONG TERM GOAL #4   Title Pt will be able to perform household chores and ambulate community distances without sensation of knee "giving out" to improve safety and decrease fall risk   Baseline happens frequently throughout the day at eval   Time 6   Period Weeks   Status New               Plan - 10/16/16 1249    Clinical Impression Statement Pt reports that she feels she is getting better. Pt is unable to stand with hips fully extended resulting in excessive use of hamstrings. Heavy cuing required for appropraite recruitment of lumbo pelvic region but pt was able to successfully perofrm exercises.    PT Next Visit Plan bike/nustep, lumbo pelvic strength   PT Home Exercise Plan active hamstring stretch seated and supine, heel-toe gait; bridges, clams   Consulted and Agree with Plan of Care Patient      Patient will benefit from skilled therapeutic intervention in order to improve the following deficits and impairments:     Visit Diagnosis: Chronic pain of right knee  Difficulty in walking, not elsewhere classified  Muscle weakness (generalized)     Problem List Patient Active Problem List   Diagnosis Date Noted  . Chest pain 01/18/2014  . Sarcoidosis ([place])   . Positive TB test   . History of Bell's palsy   . Pelvic pain in female   . Stress incontinence, female   . Nocturia   . History of glaucoma   . Uterine fibroid   . Hyperlipidemia   . COPD (chronic obstructive pulmonary disease) ([place])   . Iron deficiency anemia   . H/O hiatal hernia   . Fibromyalgia   . Arthritis   . Chronic renal insufficiency   . Diabetes mellitus without complication ([place])    Brace Welte C. Delora Gravatt PT, DPT 10/16/16 12:53 PM   [place]  [facility] [address] [place], [place], [address] Phone: [phone]   Fax:  [phone]  Name: [name] MRN: [id number] Date of Birth: Mar 29, 1951

## 2016-10-22 ENCOUNTER — Ambulatory Visit: Payer: Medicare Other | Admitting: Physical Therapy

## 2016-10-24 ENCOUNTER — Ambulatory Visit: Payer: Medicare Other | Admitting: Physical Therapy

## 2016-10-29 ENCOUNTER — Ambulatory Visit: Payer: Medicare Other | Attending: Internal Medicine | Admitting: Physical Therapy

## 2016-10-29 DIAGNOSIS — M25561 Pain in right knee: Secondary | ICD-10-CM | POA: Insufficient documentation

## 2016-10-29 DIAGNOSIS — M6281 Muscle weakness (generalized): Secondary | ICD-10-CM | POA: Insufficient documentation

## 2016-10-29 DIAGNOSIS — R262 Difficulty in walking, not elsewhere classified: Secondary | ICD-10-CM | POA: Diagnosis present

## 2016-10-29 DIAGNOSIS — G8929 Other chronic pain: Secondary | ICD-10-CM | POA: Insufficient documentation

## 2016-10-29 NOTE — Therapy (Signed)
[place], [place], [address] Phone: [phone]   Fax:  [phone]  Physical Therapy Treatment  Patient Details  Name: [name] MRN: [id number] Date of Birth: March 17, 1951 Referring Provider: Elwyn Reach, MD  Encounter Date: 10/29/2016      PT End of Session - 10/29/16 1202    Visit Number 3   Number of Visits 13   Date for PT Re-Evaluation 11/11/16   Authorization Type MCR- KX at visit 15   PT Start Time [id number]  Pt late   PT Stop Time 1245   PT Time Calculation (min) 47 min      Past Medical History:  Diagnosis Date  . Ankle edema BILATERAL   takes Lasix daily  . Arthritis    BACK, KNEE, HIPS  . Asthma   . Cancer ([place])   . Cataracts, bilateral    immature  . Chronic back pain    d/t injury in 1995  . Chronic bronchitis   . Chronic pain syndrome   . COPD (chronic obstructive pulmonary disease) (HCC) CHRONIC BRONCHITIS --  LAST BOUT  BILATERAL PNEUMONIA 2009   uses QVAR daily and Albuterol daily as needed  . Diabetes mellitus without complication ([place])    takes Glipizide and Metformin daily  . Diverticulosis   . Fibromyalgia   . Frequency of urination   . GERD (gastroesophageal reflux disease)    takes Nexium daily   . H/O hiatal hernia   . Headache    frequently but related to sinus  . Hepatitis    hx of.Not sure if A,B,C  . History of blood transfusion    no abnormal reaction noted  . History of colon polyps    benign  . History of gastric ulcer   . History of glaucoma STATES TREATED FOR ABOUT 5 YRS  AGO  --- NO ISSUES SINCE  . Hyperlipidemia    was on meds but has been off x 2 months  . Hypertension    takes Atenlol and Triamtere  daily   . Iron deficiency anemia    takes Iron pill daily  . Joint pain   . Joint swelling   . Nocturia   . Peripheral neuropathy (HCC)    takes Gabapentin daily  . Pneumonia 2010   hx of  . PTSD (post-traumatic stress disorder)    hx of-no meds  . Seasonal allergies    takes Zyrtec daily and uses Flonase daily as needed  . Stress incontinence, female   . Urgency of urination   . Vitamin D deficiency    takes Vit D daily    Past Surgical History:  Procedure Laterality Date  . BILATERAL BENIGN BREAST TUMOR REMOVED  YRS AGO  . BREAST SURGERY     2 cyst removed from right breast and tumor removed from left breast  . CARDIAC CATHETERIZATION  09-09-2008   NORMAL CORONARY ARTERIES/ NORMAL LVSF/ CHRONIC RENAL INSUFF.  Marland Kitchen COLONOSCOPY    . CYSTO WITH HYDRODISTENSION  01/07/2012   Procedure: CYSTOSCOPY/HYDRODISTENSION;  Surgeon: Reece Packer, MD;  [facility]: [facility];  Service: Urology;  Laterality: N/A;   OF BLADDER WITH INSTILLATION OF MARCAINE AND PRYDIUM  . MUSCLE BIOPSY Left 10/04/2015   Procedure: LEFT Vastus Lateralis Muscle Biopsy;  Surgeon: Ashok Pall, MD;  Location: [place] [facility];  Service: Neurosurgery;  Laterality: Left;  LEFT vastus lateralis muscle biopsy  . neck cyst    . TUBAL LIGATION  YRS AGO    There were no vitals filed for this visit.      Subjective Assessment - 10/29/16 1203    Currently in Pain? Yes   Pain Score 7    Pain Location Knee   Pain Orientation Right;Posterior;Anterior   Pain Descriptors / Indicators Spasm  constant charlie horse                         [facility] Adult PT Treatment/Exercise - 10/29/16 0001      Knee/Hip Exercises: Stretches   Passive Hamstring Stretch Right;1 rep;60 seconds   Hip Flexor Stretch Right;1 rep;60 seconds   Hip Flexor Stretch Limitations gentle lower leg off table     Knee/Hip Exercises: Aerobic   Nustep L6 x 5 minutes     Knee/Hip Exercises: Supine   Bridges Limitations ab set+ glut set+ bridge, in PF x15   Straight Leg Raises 1 set;10 reps;Both     Knee/Hip Exercises: Sidelying   Clams x10 right     Modalities   Modalities Moist Heat     Moist Heat Therapy   Number Minutes Moist Heat 15 Minutes    Moist Heat Location Knee  hamstring      Manual Therapy   Soft tissue mobilization rock blade to  R HS and gastroc/soleus                  PT Short Term Goals - 10/14/16 1259      PT SHORT TERM GOAL #1   Title Pt will verbalize knowledge of use of aquatic exercises to improve LE strength and full body well being by 11/17   Baseline began educating at eval   Time 3   Period Weeks   Status New     PT SHORT TERM GOAL #2   Title Pt will be able to stand from a chair with minimal discomfort presented from knee   Baseline severe at eval   Time 3   Period Weeks   Status New     PT SHORT TERM GOAL #3   Title Pt will verbalize at least 30% improvement in ambulation ability during functional daily activies   Baseline severe pain at eval   Time 3   Period Weeks   Status New           PT Long Term Goals - 10/14/16 1301      PT LONG TERM GOAL #1   Title Pt will be able to climb stairs at home step over step and one hand use with minimal disruption from knee to improve safety and household mobility by 12/2   Baseline leads with L and uses cane at eval   Time 6   Period Weeks   Status New     PT LONG TERM GOAL #2   Title FOTO score to 40% ability to indicate significant improvement in functional ability   Baseline 26% ability at eval   Time 6   Period Weeks   Status New     PT LONG TERM GOAL #3   Title Pt will be able to stand to cook meals for her family R leg pain <=5/10 to decrease pain impacts on daily activities   Baseline unable to stand to finish cooking due to pain at eval   Time 6   Period Weeks   Status New     PT LONG TERM GOAL #4   Title Pt will be able to perform  household chores and ambulate community distances without sensation of knee "giving out" to improve safety and decrease fall risk   Baseline happens frequently throughout the day at eval   Time 6   Period Weeks   Status New               Plan - 10/29/16 1202    Clinical  Impression Statement Pt reports she has been sick and has not attended PT or performed HEP in 1 week. She c/o constant cramp in right hamstring. Focused stretching and soft tissue work with rock blade. Pt reports decreased tenderness afterward. Encouraged her to return to performing her HEP now that she is feeling better.    PT Next Visit Plan bike/nustep, lumbo pelvic strength   PT Home Exercise Plan active hamstring stretch seated and supine, heel-toe gait; bridges, clams      Patient will benefit from skilled therapeutic intervention in order to improve the following deficits and impairments:  Abnormal gait, Difficulty walking, Increased muscle spasms, Decreased activity tolerance, Pain, Improper body mechanics, Decreased strength, Postural dysfunction  Visit Diagnosis: Chronic pain of right knee  Difficulty in walking, not elsewhere classified  Muscle weakness (generalized)     Problem List Patient Active Problem List   Diagnosis Date Noted  . Chest pain 01/18/2014  . Sarcoidosis ([place])   . Positive TB test   . History of Bell's palsy   . Pelvic pain in female   . Stress incontinence, female   . Nocturia   . History of glaucoma   . Uterine fibroid   . Hyperlipidemia   . COPD (chronic obstructive pulmonary disease) ([place])   . Iron deficiency anemia   . H/O hiatal hernia   . Fibromyalgia   . Arthritis   . Chronic renal insufficiency   . Diabetes mellitus without complication [facility])     Dorene Ar, PTA 10/29/2016, 4:09 PM  [facility] [address] [place], [place], [address] Phone: [phone]   Fax:  [phone]  Name: [name] MRN: [id number] Date of Birth: Jan 10, 1951

## 2016-10-31 ENCOUNTER — Ambulatory Visit: Payer: Medicare Other | Admitting: Physical Therapy

## 2016-11-01 ENCOUNTER — Ambulatory Visit: Payer: Medicare Other | Admitting: Physical Therapy

## 2016-11-01 DIAGNOSIS — M6281 Muscle weakness (generalized): Secondary | ICD-10-CM

## 2016-11-01 DIAGNOSIS — M25561 Pain in right knee: Principal | ICD-10-CM

## 2016-11-01 DIAGNOSIS — R262 Difficulty in walking, not elsewhere classified: Secondary | ICD-10-CM

## 2016-11-01 DIAGNOSIS — G8929 Other chronic pain: Secondary | ICD-10-CM

## 2016-11-01 NOTE — Patient Instructions (Signed)
  Glut sets at wall

## 2016-11-01 NOTE — Therapy (Signed)
[place], [place], [address] Phone: [phone]   Fax:  [phone]  Physical Therapy Treatment  Patient Details  Name: [name] MRN: [id number] Date of Birth: [birth year] Referring Provider: Elwyn Reach, MD  Encounter Date: 11/01/2016      PT End of Session - 11/01/16 0950    Visit Number 4   Number of Visits 13   Date for PT Re-Evaluation 11/11/16   Authorization Type MCR- KX at visit 15   PT Start Time 0947  pt arrived late   PT Stop Time 1025   PT Time Calculation (min) 38 min   Activity Tolerance Patient tolerated treatment well   Behavior During Therapy [facility] for tasks assessed/performed      Past Medical History:  Diagnosis Date  . Ankle edema BILATERAL   takes Lasix daily  . Arthritis    BACK, KNEE, HIPS  . Asthma   . Cancer ([place])   . Cataracts, bilateral    immature  . Chronic back pain    d/t injury in 1995  . Chronic bronchitis   . Chronic pain syndrome   . COPD (chronic obstructive pulmonary disease) (HCC) CHRONIC BRONCHITIS --  LAST BOUT  BILATERAL PNEUMONIA 2009   uses QVAR daily and Albuterol daily as needed  . Diabetes mellitus without complication ([place])    takes Glipizide and Metformin daily  . Diverticulosis   . Fibromyalgia   . Frequency of urination   . GERD (gastroesophageal reflux disease)    takes Nexium daily   . H/O hiatal hernia   . Headache    frequently but related to sinus  . Hepatitis    hx of.Not sure if A,B,C  . History of blood transfusion    no abnormal reaction noted  . History of colon polyps    benign  . History of gastric ulcer   . History of glaucoma STATES TREATED FOR ABOUT 5 YRS  AGO  --- NO ISSUES SINCE  . Hyperlipidemia    was on meds but has been off x 2 months  . Hypertension    takes Atenlol and Triamtere  daily   . Iron deficiency anemia    takes Iron pill daily  . Joint pain   . Joint swelling   . Nocturia   .  Peripheral neuropathy (HCC)    takes Gabapentin daily  . Pneumonia 2010   hx of  . PTSD (post-traumatic stress disorder)    hx of-no meds  . Seasonal allergies    takes Zyrtec daily and uses Flonase daily as needed  . Stress incontinence, female   . Urgency of urination   . Vitamin D deficiency    takes Vit D daily    Past Surgical History:  Procedure Laterality Date  . BILATERAL BENIGN BREAST TUMOR REMOVED  YRS AGO  . BREAST SURGERY     2 cyst removed from right breast and tumor removed from left breast  . CARDIAC CATHETERIZATION  09-09-2008   NORMAL CORONARY ARTERIES/ NORMAL LVSF/ CHRONIC RENAL INSUFF.  Marland Kitchen COLONOSCOPY    . CYSTO WITH HYDRODISTENSION  01/07/2012   Procedure: CYSTOSCOPY/HYDRODISTENSION;  Surgeon: Reece Packer, MD;  Location: [facility];  Service: Urology;  Laterality: N/A;   OF BLADDER WITH INSTILLATION OF MARCAINE AND PRYDIUM  . MUSCLE BIOPSY Left 10/04/2015   Procedure: LEFT Vastus Lateralis Muscle Biopsy;  Surgeon: Ashok Pall, MD;  Location: [place] [facility];  Service: Neurosurgery;  Laterality: Left;  LEFT vastus lateralis muscle biopsy  . neck cyst    . TUBAL LIGATION  YRS AGO    There were no vitals filed for this visit.      Subjective Assessment - 11/01/16 0951    Subjective reports stiff, sore, aching. Has been up for a while but moves slow and has a hard time getting places on time.   Currently in Pain? Yes   Pain Location Knee   Pain Orientation Right   Pain Descriptors / Indicators Sore;Tightness                         [facility] Adult PT Treatment/Exercise - 11/01/16 0001      Knee/Hip Exercises: Stretches   Passive Hamstring Stretch Limitations long sitting 30s   Hip Flexor Stretch Limitations thomas position     Knee/Hip Exercises: Aerobic   Nustep L4 5 min     Knee/Hip Exercises: Standing   Other Standing Knee Exercises glut sets, elbows on wall     Knee/Hip Exercises: Sidelying   Hip ABduction  Limitations with knee flx                PT Education - 11/01/16 0953    Education provided Yes   Education Details exercise form/rationale, importance of continued HEP, use of app for HEP videos-demo use on phone   Person(s) Educated Patient   Methods Demonstration;Explanation;Tactile cues;Verbal cues   Comprehension Verbalized understanding;Returned demonstration;Verbal cues required;Tactile cues required;Need further instruction          PT Short Term Goals - 10/14/16 1259      PT SHORT TERM GOAL #1   Title Pt will verbalize knowledge of use of aquatic exercises to improve LE strength and full body well being by 11/17   Baseline began educating at eval   Time 3   Period Weeks   Status New     PT SHORT TERM GOAL #2   Title Pt will be able to stand from a chair with minimal discomfort presented from knee   Baseline severe at eval   Time 3   Period Weeks   Status New     PT SHORT TERM GOAL #3   Title Pt will verbalize at least 30% improvement in ambulation ability during functional daily activies   Baseline severe pain at eval   Time 3   Period Weeks   Status New           PT Long Term Goals - 10/14/16 1301      PT LONG TERM GOAL #1   Title Pt will be able to climb stairs at home step over step and one hand use with minimal disruption from knee to improve safety and household mobility by 12/2   Baseline leads with L and uses cane at eval   Time 6   Period Weeks   Status New     PT LONG TERM GOAL #2   Title FOTO score to 40% ability to indicate significant improvement in functional ability   Baseline 26% ability at eval   Time 6   Period Weeks   Status New     PT LONG TERM GOAL #3   Title Pt will be able to stand to cook meals for her family R leg pain <=5/10 to decrease pain impacts on daily activities   Baseline unable to stand to finish cooking due to pain at eval   Time 6  Period Weeks   Status New     PT LONG TERM GOAL #4   Title Pt will  be able to perform household chores and ambulate community distances without sensation of knee "giving out" to improve safety and decrease fall risk   Baseline happens frequently throughout the day at eval   Time 6   Period Weeks   Status New               Plan - 11/01/16 1142    Clinical Impression Statement Pt reported severe difficulty with everything and is limited by fibromyalgia pain. Heavy discussion regarding posture and connection to leg pain, importance of being consistent with HEP to decrease pain.    PT Next Visit Plan bike/nustep, glut strength, hamstring & gastroc/soleus stretching   PT Home Exercise Plan active hamstring stretch seated and supine, heel-toe gait; bridges, clams   Consulted and Agree with Plan of Care Patient      Patient will benefit from skilled therapeutic intervention in order to improve the following deficits and impairments:     Visit Diagnosis: Chronic pain of right knee  Difficulty in walking, not elsewhere classified  Muscle weakness (generalized)     Problem List Patient Active Problem List   Diagnosis Date Noted  . Chest pain 01/18/2014  . Sarcoidosis ([place])   . Positive TB test   . History of Bell's palsy   . Pelvic pain in female   . Stress incontinence, female   . Nocturia   . History of glaucoma   . Uterine fibroid   . Hyperlipidemia   . COPD (chronic obstructive pulmonary disease) ([place])   . Iron deficiency anemia   . H/O hiatal hernia   . Fibromyalgia   . Arthritis   . Chronic renal insufficiency   . Diabetes mellitus without complication ([place])     Rayme Bui C. Allie Ousley PT, DPT 11/01/16 11:45 AM   [facility] [address] [place], [place], [address] Phone: [phone]   Fax:  [phone]  Name: SHIVANSHI CHAPUT MRN: [id number] Date of Birth: 1951/03/25

## 2016-11-04 ENCOUNTER — Ambulatory Visit: Payer: Medicare Other | Admitting: Physical Therapy

## 2016-11-04 ENCOUNTER — Ambulatory Visit: Payer: Medicare Other

## 2016-11-04 DIAGNOSIS — M25561 Pain in right knee: Principal | ICD-10-CM

## 2016-11-04 DIAGNOSIS — R262 Difficulty in walking, not elsewhere classified: Secondary | ICD-10-CM

## 2016-11-04 DIAGNOSIS — G8929 Other chronic pain: Secondary | ICD-10-CM

## 2016-11-04 DIAGNOSIS — M6281 Muscle weakness (generalized): Secondary | ICD-10-CM

## 2016-11-04 NOTE — Therapy (Signed)
[place], [place], [address] Phone: [phone]   Fax:  [phone]  Physical Therapy Treatment  Patient Details  Name: [name] MRN: [id number] Date of Birth: 11/12/1951 Referring Provider: Elwyn Reach, MD  Encounter Date: 11/04/2016      PT End of Session - 11/04/16 1428    Visit Number 5   Number of Visits 13   Date for PT Re-Evaluation 11/11/16   Authorization Type MCR- KX at visit 15   PT Start Time 0226  late 10 min   PT Stop Time 0310   PT Time Calculation (min) 44 min   Activity Tolerance Patient tolerated treatment well   Behavior During Therapy [facility] for tasks assessed/performed      Past Medical History:  Diagnosis Date  . Ankle edema BILATERAL   takes Lasix daily  . Arthritis    BACK, KNEE, HIPS  . Asthma   . Cancer ([place])   . Cataracts, bilateral    immature  . Chronic back pain    d/t injury in 1995  . Chronic bronchitis   . Chronic pain syndrome   . COPD (chronic obstructive pulmonary disease) (HCC) CHRONIC BRONCHITIS --  LAST BOUT  BILATERAL PNEUMONIA 2009   uses QVAR daily and Albuterol daily as needed  . Diabetes mellitus without complication ([place])    takes Glipizide and Metformin daily  . Diverticulosis   . Fibromyalgia   . Frequency of urination   . GERD (gastroesophageal reflux disease)    takes Nexium daily   . H/O hiatal hernia   . Headache    frequently but related to sinus  . Hepatitis    hx of.Not sure if A,B,C  . History of blood transfusion    no abnormal reaction noted  . History of colon polyps    benign  . History of gastric ulcer   . History of glaucoma STATES TREATED FOR ABOUT 5 YRS  AGO  --- NO ISSUES SINCE  . Hyperlipidemia    was on meds but has been off x 2 months  . Hypertension    takes Atenlol and Triamtere  daily   . Iron deficiency anemia    takes Iron pill daily  . Joint pain   . Joint swelling   . Nocturia   . Peripheral  neuropathy (HCC)    takes Gabapentin daily  . Pneumonia 2010   hx of  . PTSD (post-traumatic stress disorder)    hx of-no meds  . Seasonal allergies    takes Zyrtec daily and uses Flonase daily as needed  . Stress incontinence, female   . Urgency of urination   . Vitamin D deficiency    takes Vit D daily    Past Surgical History:  Procedure Laterality Date  . BILATERAL BENIGN BREAST TUMOR REMOVED  YRS AGO  . BREAST SURGERY     2 cyst removed from right breast and tumor removed from left breast  . CARDIAC CATHETERIZATION  09-09-2008   NORMAL CORONARY ARTERIES/ NORMAL LVSF/ CHRONIC RENAL INSUFF.  Marland Kitchen COLONOSCOPY    . CYSTO WITH HYDRODISTENSION  01/07/2012   Procedure: CYSTOSCOPY/HYDRODISTENSION;  Surgeon: Reece Packer, MD;  [facility];  Service: Urology;  Laterality: N/A;   OF BLADDER WITH INSTILLATION OF MARCAINE AND PRYDIUM  . MUSCLE BIOPSY Left 10/04/2015   Procedure: LEFT Vastus Lateralis Muscle Biopsy;  Surgeon: Ashok Pall, MD;  Location: [facility];  Service: Neurosurgery;  Laterality: Left;  LEFT vastus lateralis muscle biopsy  . neck cyst    . TUBAL LIGATION  YRS AGO    There were no vitals filed for this visit.      Subjective Assessment - 11/04/16 1429    Subjective She reports pain is less than before. Snap crackle in knees due to OA.  Knee pain no better .  She reported primary area was calfs and this is better at 6/10 level. Better over last 4 months. Swelling is better.   Currently in Pain? Yes   Pain Score 8    Pain Location Neck   Pain Descriptors / Indicators Sore;Tightness   Pain Type Chronic pain   Pain Onset More than a month ago   Pain Frequency Constant   Aggravating Factors  prolonged walking and standing   Pain Relieving Factors topical creams, meds   Multiple Pain Sites --  not addressed                         [facility] Adult PT Treatment/Exercise - 11/04/16 1436      Knee/Hip Exercises: Stretches    Passive Hamstring Stretch Limitations long sitting 30s  RT and LT   Hip Flexor Stretch Limitations thomas position  RT and LT with mild tighness bilaterally   Press photographer Right;Both;2 reps;30 seconds     Knee/Hip Exercises: Aerobic   Nustep L6 6 min     Knee/Hip Exercises: Standing   Heel Raises Both;15 reps   Hip Abduction Right;Left;15 reps  each     Knee/Hip Exercises: Supine   Bridges Limitations ab set+ glut set+ bridge, in PF x15   Straight Leg Raise with External Rotation Right;10 reps   Other Supine Knee/Hip Exercises clams with ball squeeze  x 15 then clams with green band x 15     Moist Heat Therapy   Number Minutes Moist Heat 12 Minutes   Moist Heat Location Knee  RT and Lt      Manual Therapy   Manual Therapy Passive ROM   Passive ROM ITB and TFL stretch RT and LT 2 x 30 sec  RT tighter than LT                   PT Short Term Goals - 10/14/16 1259      PT SHORT TERM GOAL #1   Title Pt will verbalize knowledge of use of aquatic exercises to improve LE strength and full body well being by 11/17   Baseline began educating at eval   Time 3   Period Weeks   Status New     PT SHORT TERM GOAL #2   Title Pt will be able to stand from a chair with minimal discomfort presented from knee   Baseline severe at eval   Time 3   Period Weeks   Status New     PT SHORT TERM GOAL #3   Title Pt will verbalize at least 30% improvement in ambulation ability during functional daily activies   Baseline severe pain at eval   Time 3   Period Weeks   Status New           PT Long Term Goals - 10/14/16 1301      PT LONG TERM GOAL #1   Title Pt will be able to climb stairs at home step over step and one hand use with minimal disruption from knee to improve safety and household mobility by  12/2   Baseline leads with L and uses cane at eval   Time 6   Period Weeks   Status New     PT LONG TERM GOAL #2   Title FOTO score to 40% ability to indicate  significant improvement in functional ability   Baseline 26% ability at eval   Time 6   Period Weeks   Status New     PT LONG TERM GOAL #3   Title Pt will be able to stand to cook meals for her family R leg pain <=5/10 to decrease pain impacts on daily activities   Baseline unable to stand to finish cooking due to pain at eval   Time 6   Period Weeks   Status New     PT LONG TERM GOAL #4   Title Pt will be able to perform household chores and ambulate community distances without sensation of knee "giving out" to improve safety and decrease fall risk   Baseline happens frequently throughout the day at eval   Time 6   Period Weeks   Status New               Plan - 11/04/16 1428    Clinical Impression Statement No changes in pain and function related to knee but calf is reported to be improved.    PT Treatment/Interventions ADLs/Self Care Home Management;Cryotherapy;Stair training;Gait training;Functional mobility training;Traction;Moist Heat;Therapeutic activities;Therapeutic exercise;Neuromuscular re-education;Patient/family education;Passive range of motion;Manual techniques;Dry needling;Taping;Vasopneumatic Device   PT Next Visit Plan bike/nustep, glut strength, hamstring & gastroc/soleus stretching  ITB stretching RT more than LT   PT Home Exercise Plan active hamstring stretch seated and supine, heel-toe gait; bridges, clams     Consulted and Agree with Plan of Care Patient      Patient will benefit from skilled therapeutic intervention in order to improve the following deficits and impairments:  Abnormal gait, Difficulty walking, Increased muscle spasms, Decreased activity tolerance, Pain, Improper body mechanics, Decreased strength, Postural dysfunction  Visit Diagnosis: Chronic pain of right knee  Difficulty in walking, not elsewhere classified  Muscle weakness (generalized)     Problem List Patient Active Problem List   Diagnosis Date Noted  . Chest pain  01/18/2014  . Sarcoidosis ([place])   . Positive TB test   . History of Bell's palsy   . Pelvic pain in female   . Stress incontinence, female   . Nocturia   . History of glaucoma   . Uterine fibroid   . Hyperlipidemia   . COPD (chronic obstructive pulmonary disease) ([place])   . Iron deficiency anemia   . H/O hiatal hernia   . Fibromyalgia   . Arthritis   . Chronic renal insufficiency   . Diabetes mellitus without complication [facility])     Darrel Hoover  PT 11/04/2016, 2:59 PM  [facility] [address] [place], [place], [address] Phone: [phone]   Fax:  [phone]  Name: [name] MRN: [id number] Date of Birth: 12-28-1950

## 2016-11-07 ENCOUNTER — Ambulatory Visit: Payer: Medicare Other | Admitting: Physical Therapy

## 2016-11-11 ENCOUNTER — Ambulatory Visit: Payer: Medicare Other | Admitting: Physical Therapy

## 2016-11-11 DIAGNOSIS — G8929 Other chronic pain: Secondary | ICD-10-CM

## 2016-11-11 DIAGNOSIS — M6281 Muscle weakness (generalized): Secondary | ICD-10-CM

## 2016-11-11 DIAGNOSIS — R262 Difficulty in walking, not elsewhere classified: Secondary | ICD-10-CM

## 2016-11-11 DIAGNOSIS — M25561 Pain in right knee: Principal | ICD-10-CM

## 2016-11-12 NOTE — Therapy (Signed)
[place], [place], [address] Phone: [phone]   Fax:  [phone]  Physical Therapy Treatment  Patient Details  Name: [name] MRN: [id number] Date of Birth: 1951/04/30 Referring Provider: Elwyn Reach, MD  Encounter Date: 11/11/2016      PT End of Session - 11/11/16 1205    Visit Number 6   Number of Visits 13   Date for PT Re-Evaluation 11/11/16   Authorization Type MCR- KX at visit 15   PT Start Time 5027   PT Stop Time 1231   PT Time Calculation (min) 38 min      Past Medical History:  Diagnosis Date  . Ankle edema BILATERAL   takes Lasix daily  . Arthritis    BACK, KNEE, HIPS  . Asthma   . Cancer ([place])   . Cataracts, bilateral    immature  . Chronic back pain    d/t injury in 1995  . Chronic bronchitis   . Chronic pain syndrome   . COPD (chronic obstructive pulmonary disease) (HCC) CHRONIC BRONCHITIS --  LAST BOUT  BILATERAL PNEUMONIA 2009   uses QVAR daily and Albuterol daily as needed  . Diabetes mellitus without complication ([place])    takes Glipizide and Metformin daily  . Diverticulosis   . Fibromyalgia   . Frequency of urination   . GERD (gastroesophageal reflux disease)    takes Nexium daily   . H/O hiatal hernia   . Headache    frequently but related to sinus  . Hepatitis    hx of.Not sure if A,B,C  . History of blood transfusion    no abnormal reaction noted  . History of colon polyps    benign  . History of gastric ulcer   . History of glaucoma STATES TREATED FOR ABOUT 5 YRS  AGO  --- NO ISSUES SINCE  . Hyperlipidemia    was on meds but has been off x 2 months  . Hypertension    takes Atenlol and Triamtere  daily   . Iron deficiency anemia    takes Iron pill daily  . Joint pain   . Joint swelling   . Nocturia   . Peripheral neuropathy (HCC)    takes Gabapentin daily  . Pneumonia 2010   hx of  . PTSD (post-traumatic stress disorder)    hx of-no  meds  . Seasonal allergies    takes Zyrtec daily and uses Flonase daily as needed  . Stress incontinence, female   . Urgency of urination   . Vitamin D deficiency    takes Vit D daily    Past Surgical History:  Procedure Laterality Date  . BILATERAL BENIGN BREAST TUMOR REMOVED  YRS AGO  . BREAST SURGERY     2 cyst removed from right breast and tumor removed from left breast  . CARDIAC CATHETERIZATION  09-09-2008   NORMAL CORONARY ARTERIES/ NORMAL LVSF/ CHRONIC RENAL INSUFF.  Marland Kitchen COLONOSCOPY    . CYSTO WITH HYDRODISTENSION  01/07/2012   Procedure: CYSTOSCOPY/HYDRODISTENSION;  Surgeon: Reece Packer, MD;  Location: [facility];  Service: Urology;  Laterality: N/A;   OF BLADDER WITH INSTILLATION OF MARCAINE AND PRYDIUM  . MUSCLE BIOPSY Left 10/04/2015   Procedure: LEFT Vastus Lateralis Muscle Biopsy;  Surgeon: Ashok Pall, MD;  Location: [place];  Service: Neurosurgery;  Laterality: Left;  LEFT vastus lateralis muscle biopsy  . neck cyst    . TUBAL LIGATION  YRS AGO  There were no vitals filed for this visit.          [facility] PT Assessment - 11/12/16 0001      Strength   Right Hip Flexion 4-/5   Right Hip Extension 3-/5  unable to obtain full extension ROM   Right Hip ABduction 3+/5   Left Hip Extension 3-/5   Left Hip ABduction 4-/5   Right Knee Flexion 4/5   Right Knee Extension 4/5                     [facility] Adult PT Treatment/Exercise - 11/12/16 0001      Ambulation/Gait   Stairs Yes   Stairs Assistance 7: Independent   Stair Management Technique Alternating pattern;No rails   Number of Stairs 8   Height of Stairs 6     Knee/Hip Exercises: Aerobic   Nustep L6 6 min     Knee/Hip Exercises: Standing   Heel Raises Both;15 reps   Hip Abduction Right;Left;15 reps  each   Forward Step Up 1 set;10 reps;Hand Hold: 1;Step Height: 6"     Knee/Hip Exercises: Supine   Bridges Limitations ab set+ glut set+ bridge, in PF x15      Knee/Hip Exercises: Sidelying   Hip ABduction 10 reps   Clams x15 right  green                  PT Short Term Goals - 11/11/16 1208      PT SHORT TERM GOAL #1   Title Pt will verbalize knowledge of use of aquatic exercises to improve LE strength and full body well being by 11/17   Baseline began educating at eval, she has not gone to the [employer] despite membership    Time 3   Period Weeks   Status On-going     PT SHORT TERM GOAL #2   Title Pt will be able to stand from a chair with minimal discomfort presented from knee   Baseline 50% improved   Status On-going     PT SHORT TERM GOAL #3   Title Pt will verbalize at least 30% improvement in ambulation ability during functional daily activies   Baseline 40% improved, still some bad days   Status Achieved           PT Long Term Goals - 11/11/16 1213      PT LONG TERM GOAL #1   Title Pt will be able to climb stairs at home step over step and one hand use with minimal disruption from knee to improve safety and household mobility by 12/2   Baseline no handrail, alternating pattern in clinic today   Time 6   Period Weeks   Status Achieved     PT LONG TERM GOAL #2   Title FOTO score to 40% ability to indicate significant improvement in functional ability   Baseline 26% ability at eval   Time 6   Period Weeks   Status Unable to assess     PT LONG TERM GOAL #3   Title Pt will be able to stand to cook meals for her family R leg pain <=5/10 to decrease pain impacts on daily activities   Baseline she has been cooking more often, still takes breaks. Some days pain, some not.    Time 6   Period Weeks   Status On-going     PT LONG TERM GOAL #4   Title Pt will be able to perform household chores and  ambulate community distances without sensation of knee "giving out" to improve safety and decrease fall risk   Baseline no episodes this week, not using cane   Time 6   Period Weeks   Status Achieved                Plan - 11/11/16 1241    Clinical Impression Statement Pt reports 40% decrease in pain with ambulation. She reports decreased episodes of knee buckling, none this week. She is not using cane today. Today she reports no pain however it may be due to prednisone she is taking for sinuses. She is able to cook more meals and take less rest breaks. She was able to climb stairs up and down with alteranting pattern without rail and no reports of pain in clinic today. STG# 3, LTG# 1 , #4 Met. Progressing toward remaining goals. Hip strength improving.    PT Next Visit Plan bike/nustep, glut strength, hamstring & gastroc/soleus stretching  ITB stretching RT more than LT; hip extension stretch and strengthen   PT Home Exercise Plan active hamstring stretch seated and supine, heel-toe gait; bridges, clams     Consulted and Agree with Plan of Care Patient      Patient will benefit from skilled therapeutic intervention in order to improve the following deficits and impairments:  Abnormal gait, Difficulty walking, Increased muscle spasms, Decreased activity tolerance, Pain, Improper body mechanics, Decreased strength, Postural dysfunction  Visit Diagnosis: Chronic pain of right knee  Difficulty in walking, not elsewhere classified  Muscle weakness (generalized)     Problem List Patient Active Problem List   Diagnosis Date Noted  . Chest pain 01/18/2014  . Sarcoidosis ([place])   . Positive TB test   . History of Bell's palsy   . Pelvic pain in female   . Stress incontinence, female   . Nocturia   . History of glaucoma   . Uterine fibroid   . Hyperlipidemia   . COPD (chronic obstructive pulmonary disease) ([place])   . Iron deficiency anemia   . H/O hiatal hernia   . Fibromyalgia   . Arthritis   . Chronic renal insufficiency   . Diabetes mellitus without complication [facility])     Dorene Ar, PTA 11/12/2016, 8:52 AM  [place], [place], [address] Phone: [phone]   Fax:  [phone]  Name: [name] MRN: [id number] Date of Birth: 10-Sep-1951

## 2016-11-13 ENCOUNTER — Ambulatory Visit: Payer: Medicare Other | Admitting: Physical Therapy

## 2016-11-13 DIAGNOSIS — M25561 Pain in right knee: Principal | ICD-10-CM

## 2016-11-13 DIAGNOSIS — M6281 Muscle weakness (generalized): Secondary | ICD-10-CM

## 2016-11-13 DIAGNOSIS — G8929 Other chronic pain: Secondary | ICD-10-CM

## 2016-11-13 DIAGNOSIS — R262 Difficulty in walking, not elsewhere classified: Secondary | ICD-10-CM

## 2016-11-13 NOTE — Patient Instructions (Signed)
Hip Flexor Stretch    Lying on back near edge of bed, bend one leg, foot flat. Hang other leg over edge, relaxed, thigh resting entirely on bed for __2__ minutes. Repeat __1__ times. Do ___2_ sessions per day.   Bridge    Lie back, legs bent. Inhale, pressing hips up. Keeping ribs in, lengthen lower back. Exhale, rolling down along spine from top. Repeat __1-__ times. Do _2___ sessions per day.  [email or web address].[email or web address]   Copyright  VHI. All rights reserved.

## 2016-11-13 NOTE — Therapy (Addendum)
[place], [place], [address] Phone: [phone]   Fax:  [phone]  Physical Therapy Treatment  Patient Details  Name: [name] MRN: [id number] Date of Birth: May 02, 1951 Referring Provider: Elwyn Reach, MD  Encounter Date: 11/13/2016      PT End of Session - 11/13/16 1208    Visit Number 7   Number of Visits 13   Date for PT Re-Evaluation 11/11/16   Authorization Type MCR- KX at visit 15   PT Start Time 1202  17 minutes late   PT Stop Time 1231   PT Time Calculation (min) 29 min      Past Medical History:  Diagnosis Date  . Ankle edema BILATERAL   takes Lasix daily  . Arthritis    BACK, KNEE, HIPS  . Asthma   . Cancer ([place])   . Cataracts, bilateral    immature  . Chronic back pain    d/t injury in 1995  . Chronic bronchitis   . Chronic pain syndrome   . COPD (chronic obstructive pulmonary disease) (HCC) CHRONIC BRONCHITIS --  LAST BOUT  BILATERAL PNEUMONIA 2009   uses QVAR daily and Albuterol daily as needed  . Diabetes mellitus without complication ([place])    takes Glipizide and Metformin daily  . Diverticulosis   . Fibromyalgia   . Frequency of urination   . GERD (gastroesophageal reflux disease)    takes Nexium daily   . H/O hiatal hernia   . Headache    frequently but related to sinus  . Hepatitis    hx of.Not sure if A,B,C  . History of blood transfusion    no abnormal reaction noted  . History of colon polyps    benign  . History of gastric ulcer   . History of glaucoma STATES TREATED FOR ABOUT 5 YRS  AGO  --- NO ISSUES SINCE  . Hyperlipidemia    was on meds but has been off x 2 months  . Hypertension    takes Atenlol and Triamtere  daily   . Iron deficiency anemia    takes Iron pill daily  . Joint pain   . Joint swelling   . Nocturia   . Peripheral neuropathy (HCC)    takes Gabapentin daily  . Pneumonia 2010   hx of  . PTSD (post-traumatic stress  disorder)    hx of-no meds  . Seasonal allergies    takes Zyrtec daily and uses Flonase daily as needed  . Stress incontinence, female   . Urgency of urination   . Vitamin D deficiency    takes Vit D daily    Past Surgical History:  Procedure Laterality Date  . BILATERAL BENIGN BREAST TUMOR REMOVED  YRS AGO  . BREAST SURGERY     2 cyst removed from right breast and tumor removed from left breast  . CARDIAC CATHETERIZATION  09-09-2008   NORMAL CORONARY ARTERIES/ NORMAL LVSF/ CHRONIC RENAL INSUFF.  Marland Kitchen COLONOSCOPY    . CYSTO WITH HYDRODISTENSION  01/07/2012   Procedure: CYSTOSCOPY/HYDRODISTENSION;  Surgeon: Reece Packer, MD;  Location: [facility];  Service: Urology;  Laterality: N/A;   OF BLADDER WITH INSTILLATION OF MARCAINE AND PRYDIUM  . MUSCLE BIOPSY Left 10/04/2015   Procedure: LEFT Vastus Lateralis Muscle Biopsy;  Surgeon: Ashok Pall, MD;  Location: [facility];  Service: Neurosurgery;  Laterality: Left;  LEFT vastus lateralis muscle biopsy  . neck cyst    . TUBAL  LIGATION  YRS AGO    There were no vitals filed for this visit.      Subjective Assessment - 11/13/16 1208    Subjective I am still feeling good    Currently in Pain? Yes   Pain Score 6    Pain Location Knee   Pain Orientation Right   Pain Descriptors / Indicators Aching   Aggravating Factors  prolonged walking and standing   Pain Relieving Factors creams, meds, rest            [facility] PT Assessment - 11/13/16 0001      Observation/Other Assessments   Focus on Therapeutic Outcomes (FOTO)  36% ability                     [facility] Adult PT Treatment/Exercise - 11/13/16 0001      Ambulation/Gait   Gait Comments Discussed her gait with hip flexion, cues to extend hip with weight shift forward during gait.      Knee/Hip Exercises: Stretches   Hip Flexor Stretch Limitations thomas position  RT and LT with mild tighness bilaterally     Knee/Hip Exercises: Aerobic    Nustep L6 6 min     Knee/Hip Exercises: Standing   Other Standing Knee Exercises facing wall, step forward with UE sliding up wall to activate hip extension with weight shift      Knee/Hip Exercises: Supine   Bridges Limitations ab set+ glut set+ bridge, in PF x15                PT Education - 11/13/16 1322    Education provided Yes   Education Details HEP   Person(s) Educated Patient   Methods Explanation;Handout   Comprehension Verbalized understanding          PT Short Term Goals - 11/11/16 1208      PT SHORT TERM GOAL #1   Title Pt will verbalize knowledge of use of aquatic exercises to improve LE strength and full body well being by 11/17   Baseline began educating at eval, she has not gone to the [employer] despite membership    Time 3   Period Weeks   Status On-going     PT SHORT TERM GOAL #2   Title Pt will be able to stand from a chair with minimal discomfort presented from knee   Baseline 50% improved   Status On-going     PT SHORT TERM GOAL #3   Title Pt will verbalize at least 30% improvement in ambulation ability during functional daily activies   Baseline 40% improved, still some bad days   Status Achieved           PT Long Term Goals - 11/11/16 1213      PT LONG TERM GOAL #1   Title Pt will be able to climb stairs at home step over step and one hand use with minimal disruption from knee to improve safety and household mobility by 12/2   Baseline no handrail, alternating pattern in clinic today   Time 6   Period Weeks   Status Achieved     PT LONG TERM GOAL #2   Title FOTO score to 40% ability to indicate significant improvement in functional ability   Baseline 26% ability at eval   Time 6   Period Weeks   Status Unable to assess     PT LONG TERM GOAL #3   Title Pt will be able to stand to cook meals  for her family R leg pain <=5/10 to decrease pain impacts on daily activities   Baseline she has been cooking more often, still takes  breaks. Some days pain, some not.    Time 6   Period Weeks   Status On-going     PT LONG TERM GOAL #4   Title Pt will be able to perform household chores and ambulate community distances without sensation of knee "giving out" to improve safety and decrease fall risk   Baseline no episodes this week, not using cane   Time 6   Period Weeks   Status Achieved               Plan - 11-19-2016 1209    Clinical Impression Statement Pt reports she is still feeling good. Her right knee is bothering her today, 6/10 pain.  She has been cooking and prepping for holiday meal without much difficulty. Focused anterior hip stretch and glute strength. Reinforced gait with good posture and activating glutes through hip extension.  Short treatment due to patient being late.    PT Next Visit Plan bike/nustep, glut strength, hamstring & gastroc/soleus stretching  ITB stretching RT more than LT; hip extension stretch and strengthen   PT Home Exercise Plan active hamstring stretch seated and supine, heel-toe gait; bridges, clams  Glute bridge, hip flexor stretch supine   Consulted and Agree with Plan of Care Patient      Patient will benefit from skilled therapeutic intervention in order to improve the following deficits and impairments:  Abnormal gait, Difficulty walking, Increased muscle spasms, Decreased activity tolerance, Pain, Improper body mechanics, Decreased strength, Postural dysfunction  Visit Diagnosis: Chronic pain of right knee  Difficulty in walking, not elsewhere classified  Muscle weakness (generalized)       G-Codes - 11/19/16 1515    Functional Assessment Tool Used 64% disability FOTO (goal 60%), clincial judgement   Functional Limitation Mobility: Walking and moving around;Other PT primary   Mobility: Walking and Moving Around Current Status [phone]) At least 60 percent but less than 80 percent impaired, limited or restricted   Mobility: Walking and Moving Around Goal Status  [phone]) At least 60 percent but less than 80 percent impaired, limited or restricted   Mobility: Walking and Moving Around Discharge Status [phone]) At least 60 percent but less than 80 percent impaired, limited or restricted   Other PT Primary Current Status [id number]) At least 60 percent but less than 80 percent impaired, limited or restricted   Other PT Primary Goal Status [id number]) At least 40 percent but less than 60 percent impaired, limited or restricted      Problem List Patient Active Problem List   Diagnosis Date Noted  . Chest pain 01/18/2014  . Sarcoidosis ([place])   . Positive TB test   . History of Bell's palsy   . Pelvic pain in female   . Stress incontinence, female   . Nocturia   . History of glaucoma   . Uterine fibroid   . Hyperlipidemia   . COPD (chronic obstructive pulmonary disease) ([place])   . Iron deficiency anemia   . H/O hiatal hernia   . Fibromyalgia   . Arthritis   . Chronic renal insufficiency   . Diabetes mellitus without complication ([place])    Ethin Drummond C. Hightower PT, DPT Nov 19, 2016 3:20 PM   [place] [address] [place], [place], [address] Phone: [phone]   Fax:  [phone]  Name: AERIS GRUWELL MRN:  [id number] Date of Birth: 12-10-1951

## 2016-11-18 ENCOUNTER — Ambulatory Visit: Payer: Medicare Other | Admitting: Physical Therapy

## 2016-11-19 ENCOUNTER — Ambulatory Visit: Payer: Medicare Other

## 2016-11-19 DIAGNOSIS — M25561 Pain in right knee: Secondary | ICD-10-CM | POA: Diagnosis not present

## 2016-11-19 DIAGNOSIS — G8929 Other chronic pain: Secondary | ICD-10-CM

## 2016-11-19 DIAGNOSIS — R262 Difficulty in walking, not elsewhere classified: Secondary | ICD-10-CM

## 2016-11-19 DIAGNOSIS — M6281 Muscle weakness (generalized): Secondary | ICD-10-CM

## 2016-11-19 NOTE — Therapy (Signed)
[place], [place], [address] Phone: [phone]   Fax:  [phone]  Physical Therapy Treatment  Patient Details  Name: [name] MRN: [id number] Date of Birth: July 10, 1951 Referring Provider: Elwyn Reach, MD  Encounter Date: 11/19/2016      PT End of Session - 11/19/16 1600    Visit Number 8   Number of Visits 13   Date for PT Re-Evaluation 12/06/16   Authorization Type MCR- KX at visit 15   PT Start Time 0400  Pt late   PT Stop Time 0430   PT Time Calculation (min) 30 min   Activity Tolerance Patient tolerated treatment well   Behavior During Therapy [facility] for tasks assessed/performed      Past Medical History:  Diagnosis Date  . Ankle edema BILATERAL   takes Lasix daily  . Arthritis    BACK, KNEE, HIPS  . Asthma   . Cancer ([place])   . Cataracts, bilateral    immature  . Chronic back pain    d/t injury in 1995  . Chronic bronchitis   . Chronic pain syndrome   . COPD (chronic obstructive pulmonary disease) (HCC) CHRONIC BRONCHITIS --  LAST BOUT  BILATERAL PNEUMONIA 2009   uses QVAR daily and Albuterol daily as needed  . Diabetes mellitus without complication ([place])    takes Glipizide and Metformin daily  . Diverticulosis   . Fibromyalgia   . Frequency of urination   . GERD (gastroesophageal reflux disease)    takes Nexium daily   . H/O hiatal hernia   . Headache    frequently but related to sinus  . Hepatitis    hx of.Not sure if A,B,C  . History of blood transfusion    no abnormal reaction noted  . History of colon polyps    benign  . History of gastric ulcer   . History of glaucoma STATES TREATED FOR ABOUT 5 YRS  AGO  --- NO ISSUES SINCE  . Hyperlipidemia    was on meds but has been off x 2 months  . Hypertension    takes Atenlol and Triamtere  daily   . Iron deficiency anemia    takes Iron pill daily  . Joint pain   . Joint swelling   . Nocturia   . Peripheral  neuropathy (HCC)    takes Gabapentin daily  . Pneumonia 2010   hx of  . PTSD (post-traumatic stress disorder)    hx of-no meds  . Seasonal allergies    takes Zyrtec daily and uses Flonase daily as needed  . Stress incontinence, female   . Urgency of urination   . Vitamin D deficiency    takes Vit D daily    Past Surgical History:  Procedure Laterality Date  . BILATERAL BENIGN BREAST TUMOR REMOVED  YRS AGO  . BREAST SURGERY     2 cyst removed from right breast and tumor removed from left breast  . CARDIAC CATHETERIZATION  09-09-2008   NORMAL CORONARY ARTERIES/ NORMAL LVSF/ CHRONIC RENAL INSUFF.  Marland Kitchen COLONOSCOPY    . CYSTO WITH HYDRODISTENSION  01/07/2012   Procedure: CYSTOSCOPY/HYDRODISTENSION;  Surgeon: Reece Packer, MD;  Location: [facility];  Service: Urology;  Laterality: N/A;   OF BLADDER WITH INSTILLATION OF MARCAINE AND PRYDIUM  . MUSCLE BIOPSY Left 10/04/2015   Procedure: LEFT Vastus Lateralis Muscle Biopsy;  Surgeon: Ashok Pall, MD;  Location: [place] [facility];  Service: Neurosurgery;  Laterality:  Left;  LEFT vastus lateralis muscle biopsy  . neck cyst    . TUBAL LIGATION  YRS AGO    There were no vitals filed for this visit.      Subjective Assessment - 11/19/16 1601    Currently in Pain? Yes   Pain Score 6    Pain Location Knee   Pain Orientation Right;Left   Pain Descriptors / Indicators Aching   Pain Type Chronic pain   Pain Onset More than a month ago   Pain Frequency Constant   Aggravating Factors  prolonged time on feet   Pain Relieving Factors rest meds                         [facility] Adult PT Treatment/Exercise - 11/19/16 0001      Knee/Hip Exercises: Stretches   Hip Flexor Stretch Limitations thomas position  RT and LT with mild tighness bilaterally 45 sec x 1 RT and LT     Knee/Hip Exercises: Aerobic   Nustep L6 6 min  She reports using nustep incr pain     Knee/Hip Exercises: Standing   Hip Abduction  Right;Left;10 reps  green band short steps     Knee/Hip Exercises: Seated   Long Arc Quad 15 reps;Right;Left   Long Arc Quad Weight 3 lbs.  3 sec hold     Knee/Hip Exercises: Supine   Bridges Limitations ab set+ glut set+ bridge, in PF x15   Straight Leg Raises Right;Left;2 sets;10 reps                  PT Short Term Goals - 11/19/16 1637      PT SHORT TERM GOAL #1   Title Pt will verbalize knowledge of use of aquatic exercises to improve LE strength and full body well being by 11/17   Baseline Reports she is going to [facility] today   Status Partially Met     PT SHORT TERM GOAL #2   Title Pt will be able to stand from a chair with minimal discomfort presented from knee   Baseline 50% improved   Status On-going     PT SHORT TERM GOAL #3   Title Pt will verbalize at least 30% improvement in ambulation ability during functional daily activies   Baseline 40% improved, still some bad days   Status Achieved           PT Long Term Goals - 11/19/16 1638      PT LONG TERM GOAL #1   Title Pt will be able to climb stairs at home step over step and one hand use with minimal disruption from knee to improve safety and household mobility by 12/2   Status Achieved     PT LONG TERM GOAL #2   Title FOTO score to 40% ability to indicate significant improvement in functional ability   Status Unable to assess     PT LONG TERM GOAL #3   Title Pt will be able to stand to cook meals for her family R leg pain <=5/10 to decrease pain impacts on daily activities   Baseline she has been cooking more often, still takes breaks. Some days pain, some not.    Status Partially Met     PT LONG TERM GOAL #4   Title Pt will be able to perform household chores and ambulate community distances without sensation of knee "giving out" to improve safety and decrease fall risk   Baseline no  episodes this week, not using cane   Status Achieved               Plan - 11/19/16 1601    Clinical  Impression Statement Short treatment due to pt late. so full treatment not able to be done.  She was able to do all exercise correctly. Pain levels really not changed. Extended sessions per plan and advised pt probable discharge at that time.   PT Treatment/Interventions ADLs/Self Care Home Management;Cryotherapy;Stair training;Gait training;Functional mobility training;Traction;Moist Heat;Therapeutic activities;Therapeutic exercise;Neuromuscular re-education;Patient/family education;Passive range of motion;Manual techniques;Dry needling;Taping;Vasopneumatic Device   PT Next Visit Plan bike/nustep, glut strength, hamstring & gastroc/soleus stretching  ITB stretching RT more than LT; hip extension stretch and strengthen,  MMT   PT Home Exercise Plan active hamstring stretch seated and supine, heel-toe gait; bridges, clams  Glute bridge, hip flexor stretch supine   Consulted and Agree with Plan of Care Patient      Patient will benefit from skilled therapeutic intervention in order to improve the following deficits and impairments:  Abnormal gait, Difficulty walking, Increased muscle spasms, Decreased activity tolerance, Pain, Improper body mechanics, Decreased strength, Postural dysfunction  Visit Diagnosis: Chronic pain of right knee  Difficulty in walking, not elsewhere classified  Muscle weakness (generalized)     Problem List Patient Active Problem List   Diagnosis Date Noted  . Chest pain 01/18/2014  . Sarcoidosis ([place])   . Positive TB test   . History of Bell's palsy   . Pelvic pain in female   . Stress incontinence, female   . Nocturia   . History of glaucoma   . Uterine fibroid   . Hyperlipidemia   . COPD (chronic obstructive pulmonary disease) ([place])   . Iron deficiency anemia   . H/O hiatal hernia   . Fibromyalgia   . Arthritis   . Chronic renal insufficiency   . Diabetes mellitus without complication [facility])     Darrel Hoover   PT 11/19/2016, 4:41 PM  [facility] [address] [place], [place], [address] Phone: [phone]   Fax:  [phone]  Name: ARGIE LOBER MRN: [id number] Date of Birth: 03-Oct-1951

## 2016-11-20 ENCOUNTER — Ambulatory Visit: Payer: Medicare Other | Admitting: Physical Therapy

## 2016-11-25 ENCOUNTER — Ambulatory Visit: Payer: Medicare HMO | Admitting: Physical Therapy

## 2016-12-02 ENCOUNTER — Ambulatory Visit: Payer: Medicare HMO | Admitting: Physical Therapy

## 2016-12-09 ENCOUNTER — Ambulatory Visit: Payer: Medicare HMO | Attending: Internal Medicine

## 2016-12-09 DIAGNOSIS — G8929 Other chronic pain: Secondary | ICD-10-CM | POA: Diagnosis present

## 2016-12-09 DIAGNOSIS — R262 Difficulty in walking, not elsewhere classified: Secondary | ICD-10-CM | POA: Diagnosis present

## 2016-12-09 DIAGNOSIS — M6281 Muscle weakness (generalized): Secondary | ICD-10-CM | POA: Diagnosis present

## 2016-12-09 DIAGNOSIS — M25561 Pain in right knee: Secondary | ICD-10-CM | POA: Diagnosis present

## 2016-12-09 NOTE — Patient Instructions (Signed)
From cabinet LAQ , 10-20 reps hld 5-10 sec  And Prone knee flexion 10-25 reps  Both 2x/day

## 2016-12-09 NOTE — Therapy (Addendum)
[name], [place], [address] Phone: [phone]   Fax:  [phone]  Physical Therapy Treatment/Discharge  Patient Details  Name: [name] MRN: [id number] Date of Birth: 04-12-1951 Referring Provider: Elwyn Reach, MD  Encounter Date: 12/09/2016      PT End of Session - 12/09/16 1308    Visit Number 9   Number of Visits 13   Date for PT Re-Evaluation 12/06/16   Authorization Type MCR- KX at visit 15   PT Start Time 9191   PT Stop Time 1312   PT Time Calculation (min) 31 min   Activity Tolerance Patient tolerated treatment well;No increased pain   Behavior During Therapy WFL for tasks assessed/performed      Past Medical History:  Diagnosis Date  . Ankle edema BILATERAL   takes Lasix daily  . Arthritis    BACK, KNEE, HIPS  . Asthma   . Cancer ([place])   . Cataracts, bilateral    immature  . Chronic back pain    d/t injury in 1995  . Chronic bronchitis   . Chronic pain syndrome   . COPD (chronic obstructive pulmonary disease) (HCC) CHRONIC BRONCHITIS --  LAST BOUT  BILATERAL PNEUMONIA 2009   uses QVAR daily and Albuterol daily as needed  . Diabetes mellitus without complication ([place])    takes Glipizide and Metformin daily  . Diverticulosis   . Fibromyalgia   . Frequency of urination   . GERD (gastroesophageal reflux disease)    takes Nexium daily   . H/O hiatal hernia   . Headache    frequently but related to sinus  . Hepatitis    hx of.Not sure if A,B,C  . History of blood transfusion    no abnormal reaction noted  . History of colon polyps    benign  . History of gastric ulcer   . History of glaucoma STATES TREATED FOR ABOUT 5 YRS  AGO  --- NO ISSUES SINCE  . Hyperlipidemia    was on meds but has been off x 2 months  . Hypertension    takes Atenlol and Triamtere  daily   . Iron deficiency anemia    takes Iron pill daily  . Joint pain   . Joint swelling   . Nocturia    . Peripheral neuropathy (HCC)    takes Gabapentin daily  . Pneumonia 2010   hx of  . PTSD (post-traumatic stress disorder)    hx of-no meds  . Seasonal allergies    takes Zyrtec daily and uses Flonase daily as needed  . Stress incontinence, female   . Urgency of urination   . Vitamin D deficiency    takes Vit D daily    Past Surgical History:  Procedure Laterality Date  . BILATERAL BENIGN BREAST TUMOR REMOVED  YRS AGO  . BREAST SURGERY     2 cyst removed from right breast and tumor removed from left breast  . CARDIAC CATHETERIZATION  09-09-2008   NORMAL CORONARY ARTERIES/ NORMAL LVSF/ CHRONIC RENAL INSUFF.  Marland Kitchen COLONOSCOPY    . CYSTO WITH HYDRODISTENSION  01/07/2012   Procedure: CYSTOSCOPY/HYDRODISTENSION;  Surgeon: Reece Packer, MD;  Location: [facility];  Service: Urology;  Laterality: N/A;   OF BLADDER WITH INSTILLATION OF MARCAINE AND PRYDIUM  . MUSCLE BIOPSY Left 10/04/2015   Procedure: LEFT Vastus Lateralis Muscle Biopsy;  Surgeon: Ashok Pall, MD;  Location: [facility];  Service: Neurosurgery;  Laterality: Left;  LEFT vastus lateralis muscle biopsy  . neck cyst    . TUBAL LIGATION  YRS AGO    There were no vitals filed for this visit.      Subjective Assessment - 12/09/16 1243    Subjective Pain today , expect will have pain. . Leg was swollen last week for a few days but better now.  she feels she is doing better but pain level no better   Patient is accompained by: Family member   Currently in Pain? Yes   Pain Score 6    Pain Location Knee   Pain Orientation Right;Left   Pain Descriptors / Indicators Aching   Pain Type Chronic pain   Pain Onset More than a month ago   Pain Frequency Constant   Aggravating Factors  weight bearing   Pain Relieving Factors rest , meds   Multiple Pain Sites No            [facility] PT Assessment - 12/09/16 0001      Observation/Other Assessments-Edema    Edema Circumferential     Circumferential Edema    Circumferential - Right 47  cm at mid patella   Circumferential - Left  46 cm at mid- patella      ROM / Strength   AROM / PROM / Strength AROM     AROM   Overall AROM Comments prone LT knee flexion 75  RT 82,   supine knee flexion LT 110  RT 105     Strength   Right Hip Flexion 4/5   Right Hip Extension 4-/5   Right Hip ABduction 4/5   Left Hip Extension 4/5   Right Knee Flexion 4+/5   Right Knee Extension 4+/5                             PT Education - 12/09/16 1301    Education provided Yes   Education Details LAQ and prone knee flexion, suggested continue elevation with ice if swelling occurs and generlally 1-2x/day if able   Person(s) Educated Patient   Methods Explanation;Handout;Verbal cues   Comprehension Verbalized understanding;Returned demonstration          PT Short Term Goals - 11/19/16 1637      PT SHORT TERM GOAL #1   Title Pt will verbalize knowledge of use of aquatic exercises to improve LE strength and full body well being by 11/17   Baseline Reports she is going to [employer] today   Status Partially Met     PT SHORT TERM GOAL #2   Title Pt will be able to stand from a chair with minimal discomfort presented from knee   Baseline 50% improved   Status On-going     PT SHORT TERM GOAL #3   Title Pt will verbalize at least 30% improvement in ambulation ability during functional daily activies   Baseline 40% improved, still some bad days   Status Achieved           PT Long Term Goals - 12/09/16 1311      PT LONG TERM GOAL #1   Title Pt will be able to climb stairs at home step over step and one hand use with minimal disruption from knee to improve safety and household mobility by 12/2   Status Achieved     PT LONG TERM GOAL #2   Title FOTO score to 40% ability to indicate significant improvement in functional ability  Baseline She had to leave    Status Unable to assess     PT LONG TERM GOAL #3   Title Pt will be able to  stand to cook meals for her family R leg pain <=5/10 to decrease pain impacts on daily activities   Baseline she has been cooking more often, still takes breaks. Some days pain, some not.    Status Partially Met     PT LONG TERM GOAL #4   Title Pt will be able to perform household chores and ambulate community distances without sensation of knee "giving out" to improve safety and decrease fall risk   Status Achieved               Plan - 12/12/16 1308    Clinical Impression Statement Pt late today . Her pain levels are essentially the same . She is able to do her HEP correctly and she was encouraged to do them regularly 1x/day minimally.  She stated she would.   As she still had some swelling I encouraged her to elevate during day if able   PT Treatment/Interventions ADLs/Self Care Home Management;Cryotherapy;Stair training;Gait training;Functional mobility training;Traction;Moist Heat;Therapeutic activities;Therapeutic exercise;Neuromuscular re-education;Patient/family education;Passive range of motion;Manual techniques;Dry needling;Taping;Vasopneumatic Device   PT Next Visit Plan Discharge with HEP   PT Home Exercise Plan active hamstring stretch seated and supine, heel-toe gait; bridges, clams  Glute bridge, hip flexor stretch supine, LAQ PKF   Consulted and Agree with Plan of Care Patient      Patient will benefit from skilled therapeutic intervention in order to improve the following deficits and impairments:  Abnormal gait, Difficulty walking, Increased muscle spasms, Decreased activity tolerance, Pain, Improper body mechanics, Decreased strength, Postural dysfunction  Visit Diagnosis: Chronic pain of right knee  Difficulty in walking, not elsewhere classified  Muscle weakness (generalized)       G-Codes - 12-Dec-2016 1312    Functional Assessment Tool Used clinical judgement   Functional Limitation Mobility: Walking and moving around   Mobility: Walking and Moving Around  Goal Status [phone]) At least 40 percent but less than 60 percent impaired, limited or restricted   Mobility: Walking and Moving Around Discharge Status [phone]) At least 40 percent but less than 60 percent impaired, limited or restricted      Problem List Patient Active Problem List   Diagnosis Date Noted  . Chest pain 01/18/2014  . Sarcoidosis ([place])   . Positive TB test   . History of Bell's palsy   . Pelvic pain in female   . Stress incontinence, female   . Nocturia   . History of glaucoma   . Uterine fibroid   . Hyperlipidemia   . COPD (chronic obstructive pulmonary disease) ([place])   . Iron deficiency anemia   . H/O hiatal hernia   . Fibromyalgia   . Arthritis   . Chronic renal insufficiency   . Diabetes mellitus without complication [facility])     Darrel Hoover  PT 2016-12-12, 1:15 PM  [facility] [address] [place], [place], [address] Phone: [phone]   Fax:  [phone]  Name: GENIVA LOHNES MRN: [id number] Date of Birth: [birth year]  PHYSICAL THERAPY DISCHARGE SUMMARY  Visits from Start of Care: 9  Current functional level related to goals / functional outcomes: See above. She continues to have weakness and pain/swelling but she should continue to improve function if she is consistent with HEP .   Remaining deficits: See above   Education /  Equipment: HEP for hip and knee strength Plan: Patient agrees to discharge.  Patient goals were partially met. Patient is being discharged due to                                                     ?????MAX  Benefit from skilled PT at this time.  She was consistently late for appointments and only made 9 sessions over 2 months. I knee flares again may need to see Ortho MD

## 2017-08-08 ENCOUNTER — Emergency Department (HOSPITAL_COMMUNITY)
Admission: EM | Admit: 2017-08-08 | Discharge: 2017-08-08 | Disposition: A | Discharge status: Home / Self Care | Payer: Medicare Other | Attending: Emergency Medicine | Admitting: Emergency Medicine

## 2017-08-08 ENCOUNTER — Encounter (HOSPITAL_COMMUNITY): Payer: Self-pay | Admitting: Emergency Medicine

## 2017-08-08 DIAGNOSIS — Z91040 Latex allergy status: Secondary | ICD-10-CM | POA: Insufficient documentation

## 2017-08-08 DIAGNOSIS — I10 Essential (primary) hypertension: Secondary | ICD-10-CM | POA: Diagnosis not present

## 2017-08-08 DIAGNOSIS — M791 Myalgia, unspecified site: Secondary | ICD-10-CM

## 2017-08-08 DIAGNOSIS — Z7984 Long term (current) use of oral hypoglycemic drugs: Secondary | ICD-10-CM | POA: Diagnosis not present

## 2017-08-08 DIAGNOSIS — J449 Chronic obstructive pulmonary disease, unspecified: Secondary | ICD-10-CM | POA: Diagnosis not present

## 2017-08-08 DIAGNOSIS — Z87891 Personal history of nicotine dependence: Secondary | ICD-10-CM | POA: Diagnosis not present

## 2017-08-08 DIAGNOSIS — E785 Hyperlipidemia, unspecified: Secondary | ICD-10-CM | POA: Insufficient documentation

## 2017-08-08 DIAGNOSIS — Z79899 Other long term (current) drug therapy: Secondary | ICD-10-CM | POA: Insufficient documentation

## 2017-08-08 DIAGNOSIS — E119 Type 2 diabetes mellitus without complications: Secondary | ICD-10-CM | POA: Diagnosis not present

## 2017-08-08 LAB — COMPREHENSIVE METABOLIC PANEL
ALBUMIN: 3.5 g/dL (ref 3.5–5.0)
ALK PHOS: 61 U/L (ref 38–126)
ALT: 16 U/L (ref 14–54)
AST: 26 U/L (ref 15–41)
Anion gap: 5 (ref 5–15)
BILIRUBIN TOTAL: 0.2 mg/dL — AB (ref 0.3–1.2)
BUN: 22 mg/dL — AB (ref 6–20)
CALCIUM: 8.9 mg/dL (ref 8.9–10.3)
CO2: 29 mmol/L (ref 22–32)
CREATININE: 1.17 mg/dL — AB (ref 0.44–1.00)
Chloride: 105 mmol/L (ref 101–111)
GFR calc Af Amer: 55 mL/min — ABNORMAL LOW (ref >60)
GFR, EST NON AFRICAN AMERICAN: 48 mL/min — AB (ref >60)
GLUCOSE: 188 mg/dL — AB (ref 65–99)
Potassium: 4 mmol/L (ref 3.5–5.1)
Sodium: 139 mmol/L (ref 135–145)
TOTAL PROTEIN: 6.4 g/dL — AB (ref 6.5–8.1)

## 2017-08-08 LAB — URINALYSIS, ROUTINE W REFLEX MICROSCOPIC
BACTERIA UA: NONE SEEN
Bilirubin Urine: NEGATIVE
GLUCOSE, UA: NEGATIVE mg/dL
Hgb urine dipstick: NEGATIVE
Ketones, ur: NEGATIVE mg/dL
NITRITE: NEGATIVE
PROTEIN: NEGATIVE mg/dL
Specific Gravity, Urine: 1.023 (ref 1.005–1.030)
pH: 5 (ref 5.0–8.0)

## 2017-08-08 LAB — CBC WITH DIFFERENTIAL/PLATELET
BASOS ABS: 0 10*3/uL (ref 0.0–0.1)
Basophils Relative: 1 %
Eosinophils Absolute: 0.2 10*3/uL (ref 0.0–0.7)
Eosinophils Relative: 3 %
HEMATOCRIT: 30.2 % — AB (ref 36.0–46.0)
HEMOGLOBIN: 9.7 g/dL — AB (ref 12.0–15.0)
LYMPHS PCT: 47 %
Lymphs Abs: 2.7 10*3/uL (ref 0.7–4.0)
MCH: 27.9 pg (ref 26.0–34.0)
MCHC: 32.1 g/dL (ref 30.0–36.0)
MCV: 86.8 fL (ref 78.0–100.0)
MONO ABS: 0.3 10*3/uL (ref 0.1–1.0)
Monocytes Relative: 6 %
NEUTROS ABS: 2.5 10*3/uL (ref 1.7–7.7)
NEUTROS PCT: 43 %
Platelets: 202 10*3/uL (ref 150–400)
RBC: 3.48 MIL/uL — AB (ref 3.87–5.11)
RDW: 15.3 % (ref 11.5–15.5)
WBC: 5.7 10*3/uL (ref 4.0–10.5)

## 2017-08-08 LAB — CK: CK TOTAL: 252 U/L — AB (ref 38–234)

## 2017-08-08 NOTE — ED Provider Notes (Signed)
[facility] DEPT Provider Note   CSN: [id number] Arrival date & time: 08/08/17  0010     History   Chief Complaint Chief Complaint  Patient presents with  . Back Pain  . Thigh Pain    HPI [name] is a 66 y.o. female.  HPI 66 year old female with an extensive past medical history listed below including diabetes and fibromyalgia presents to the emergency department with several days of constant myalgia in the setting of feeling like her blood sugar was low. Patient reported that several days ago she felt like her sugar had dropped, causing her to feel lightheaded, sweaty, flushed. This resolved after food intake. However she started noticing myalgia of the lower extremities which quickly spread to the rest of her body. Patient reports that she has had this in the past but has never been this prolonged of duration. Pain is exacerbated with palpation and movement. No real alleviating factors. Myalgias have been constant however fluctuating in nature. She denies any focal weakness, urinary or bowel incontinence. She denies any fevers but does report she was recently treated for sinus infection. Denies any alleviating or aggravating factors. Denies any other physical complaints.  Past Medical History:  Diagnosis Date  . Ankle edema BILATERAL   takes Lasix daily  . Arthritis    BACK, KNEE, HIPS  . Asthma   . Cancer ([place])   . Cataracts, bilateral    immature  . Chronic back pain    d/t injury in 1995  . Chronic bronchitis   . Chronic pain syndrome   . COPD (chronic obstructive pulmonary disease) (HCC) CHRONIC BRONCHITIS --  LAST BOUT  BILATERAL PNEUMONIA 2009   uses QVAR daily and Albuterol daily as needed  . Diabetes mellitus without complication ([place])    takes Glipizide and Metformin daily  . Diverticulosis   . Fibromyalgia   . Frequency of urination   . GERD (gastroesophageal reflux disease)    takes Nexium daily   . H/O hiatal hernia   . Headache    frequently but  related to sinus  . Hepatitis    hx of.Not sure if A,B,C  . History of blood transfusion    no abnormal reaction noted  . History of colon polyps    benign  . History of gastric ulcer   . History of glaucoma STATES TREATED FOR ABOUT 5 YRS  AGO  --- NO ISSUES SINCE  . Hyperlipidemia    was on meds but has been off x 2 months  . Hypertension    takes Atenlol and Triamtere  daily   . Iron deficiency anemia    takes Iron pill daily  . Joint pain   . Joint swelling   . Nocturia   . Peripheral neuropathy    takes Gabapentin daily  . Pneumonia 2010   hx of  . PTSD (post-traumatic stress disorder)    hx of-no meds  . Seasonal allergies    takes Zyrtec daily and uses Flonase daily as needed  . Stress incontinence, female   . Urgency of urination   . Vitamin D deficiency    takes Vit D daily    Patient Active Problem List   Diagnosis Date Noted  . Chest pain 01/18/2014  . Sarcoidosis   . Positive TB test   . History of Bell's palsy   . Pelvic pain in female   . Stress incontinence, female   . Nocturia   . History of glaucoma   . Uterine  fibroid   . Hyperlipidemia   . COPD (chronic obstructive pulmonary disease) ([place])   . Iron deficiency anemia   . H/O hiatal hernia   . Fibromyalgia   . Arthritis   . Chronic renal insufficiency   . Diabetes mellitus without complication [facility])     Past Surgical History:  Procedure Laterality Date  . BILATERAL BENIGN BREAST TUMOR REMOVED  YRS AGO  . BREAST SURGERY     2 cyst removed from right breast and tumor removed from left breast  . CARDIAC CATHETERIZATION  09-09-2008   NORMAL CORONARY ARTERIES/ NORMAL LVSF/ CHRONIC RENAL INSUFF.  Marland Kitchen COLONOSCOPY    . CYSTO WITH HYDRODISTENSION  01/07/2012   Procedure: CYSTOSCOPY/HYDRODISTENSION;  Surgeon: Reece Packer, MD;  Location: [facility];  Service: Urology;  Laterality: N/A;   OF BLADDER WITH INSTILLATION OF MARCAINE AND PRYDIUM  . MUSCLE BIOPSY Left 10/04/2015    Procedure: LEFT Vastus Lateralis Muscle Biopsy;  Surgeon: Ashok Pall, MD;  Location: [facility];  Service: Neurosurgery;  Laterality: Left;  LEFT vastus lateralis muscle biopsy  . neck cyst    . TUBAL LIGATION  YRS AGO    OB History    No data available       Home Medications    Prior to Admission medications   Medication Sig Start Date End Date Taking? Authorizing Provider  albuterol (PROVENTIL HFA;VENTOLIN HFA) 108 (90 BASE) MCG/ACT inhaler Inhale 2 puffs into the lungs every 6 (six) hours as needed for wheezing or shortness of breath. Shortness of breath    [provider]  aspirin-acetaminophen-caffeine (EXCEDRIN EXTRA STRENGTH) [phone] MG per tablet Take 2 tablets by mouth every 6 (six) hours as needed for pain.    [provider]  atenolol (TENORMIN) 50 MG tablet Take 75 mg by mouth daily.    [provider]  beclomethasone (QVAR) 80 MCG/ACT inhaler Inhale 2 puffs into the lungs 2 (two) times daily.    [provider]  cephALEXin (KEFLEX) 500 MG capsule Take 1 capsule (500 mg total) by mouth 2 (two) times daily. Patient not taking: Reported on 10/14/2016 02/07/16   Everlene Balls, MD  cetirizine (ZYRTEC) 10 MG tablet Take 10 mg by mouth daily.    [provider]  Cholecalciferol (VITAMIN D) 2000 UNITS CAPS Take 2,000 Units by mouth daily.    [provider]  doxycycline (VIBRAMYCIN) 100 MG capsule Take 1 capsule (100 mg total) by mouth 2 (two) times daily. One po bid x 5 days Patient not taking: Reported on 10/14/2016 02/07/16   Everlene Balls, MD  esomeprazole (NEXIUM) 40 MG capsule Take 40 mg by mouth daily before breakfast.    [provider]  Ferrous Gluconate (IRON) 240 (27 FE) MG TABS Take 240 mg by mouth daily.     [provider]  fluticasone (FLONASE) 50 MCG/ACT nasal spray Place 2 sprays into the nose as needed for allergies. congestion    [provider]  furosemide (LASIX) 40 MG tablet Take  40 mg by mouth daily.     [provider]  gabapentin (NEURONTIN) 600 MG tablet Take 600 mg by mouth 3 (three) times daily.    [provider]  glipiZIDE (GLUCOTROL XL) 10 MG 24 hr tablet Take 10 mg by mouth daily. Before main meal    [provider]  HYDROcodone-acetaminophen (NORCO/VICODIN) 5-325 MG tablet Take 1 tablet by mouth every 6 (six) hours as needed for moderate pain. Patient not taking: Reported on  10/14/2016 10/04/15   Ashok Pall, MD  ibuprofen (ADVIL,MOTRIN) 600 MG tablet Take 600 mg by mouth every 6 (six) hours as needed for headache.    [provider]  metFORMIN (GLUCOPHAGE) 850 MG tablet Take 850 mg by mouth 2 (two) times daily with a meal.    [provider]  methocarbamol (ROBAXIN) 750 MG tablet Take 750 mg by mouth every 8 (eight) hours.     [provider]  oxycodone (ROXICODONE) 30 MG immediate release tablet Take 30 mg by mouth every 4 (four) hours as needed for pain. For pain    [provider]  triamterene-hydrochlorothiazide (MAXZIDE-25) 37.5-25 MG per tablet Take 1 tablet by mouth daily.  01/13/14   [provider]  VOLTAREN 1 % GEL Apply 1 application topically 3 (three) times daily as needed. Pain. Apply to knees, hands, and ankles. 02/10/15   [provider]    Family History Family History  Problem Relation Age of Onset  . Cancer Mother   . Diabetes Mother   . Hypertension Mother   . Cancer Sister   . Hypertension Sister   . Hypertension Son     Social History Social History  Substance Use Topics  . Smoking status: Former Smoker    Packs/day: 1.00    Years: 27.00    Types: Cigarettes  . Smokeless tobacco: Never Used     Comment: quit smoking in 1993  . Alcohol use No     Comment: quit drinking 25+yrs ago     Allergies   Avapro [irbesartan]; Lisinopril; and Latex   Review of Systems Review of Systems All other systems are reviewed and are negative for acute  change except as noted in the HPI   Physical Exam Updated Vital Signs BP 133/78 (BP Location: Left Arm)   Pulse 67   Temp 98.2 F (36.8 C) (Oral)   Resp 16   SpO2 96%   Physical Exam  Constitutional: She is oriented to person, place, and time. She appears well-developed and well-nourished. No distress.  HENT:  Head: Normocephalic and atraumatic.  Nose: Nose normal.  Eyes: Pupils are equal, round, and reactive to light. Conjunctivae and EOM are normal. Right eye exhibits no discharge. Left eye exhibits no discharge. No scleral icterus.  Neck: Normal range of motion. Neck supple.  Cardiovascular: Normal rate and regular rhythm.  Exam reveals no gallop and no friction rub.   No murmur heard. Pulmonary/Chest: Effort normal and breath sounds normal. No stridor. No respiratory distress. She has no rales.  Abdominal: Soft. She exhibits no distension. There is no tenderness.  Musculoskeletal: She exhibits no edema.       Lumbar back: She exhibits tenderness. She exhibits no bony tenderness.       Right upper arm: She exhibits tenderness.       Left upper arm: She exhibits tenderness.       Right forearm: She exhibits tenderness.       Left forearm: She exhibits tenderness.       Right upper leg: She exhibits tenderness.       Left upper leg: She exhibits tenderness.       Right lower leg: She exhibits tenderness.       Left lower leg: She exhibits tenderness.  Neurological: She is alert and oriented to person, place, and time.  Mental Status: Alert and oriented to person, place, and time. Attention and concentration normal. Speech clear. Recent memory is intact  Cranial Nerves  II  Visual Fields: Intact to confrontation. Visual fields intact. III, IV, VI: Pupils equal and reactive to light and near. Full eye movement without nystagmus  V Facial Sensation: Normal. No weakness of masticatory muscles  VII: No facial weakness or asymmetry  VIII Auditory Acuity: Grossly normal  IX/X: The  uvula is midline; the palate elevates symmetrically  XI: Normal sternocleidomastoid and trapezius strength  XII: The tongue is midline. No atrophy or fasciculations.   Motor System: Muscle Strength: 5/5 and symmetric in the upper and lower extremities. No pronation or drift.  Muscle Tone: Tone and muscle bulk are normal in the upper and lower extremities.   Reflexes: DTRs: 1+ and symmetrical in all four extremities. Plantar responses are flexor bilaterally.  Coordination: Intact finger-to-nose, heel-to-shin. No tremor.  Sensation: Intact to light touch, and pinprick.   Gait: antalgic    Skin: Skin is warm and dry. No rash noted. She is not diaphoretic. No erythema.  Psychiatric: She has a normal mood and affect.  Vitals reviewed.    ED Treatments / Results  Labs (all labs ordered are listed, but only abnormal results are displayed) Labs Reviewed  URINALYSIS, ROUTINE W REFLEX MICROSCOPIC - Abnormal; Notable for the following:       Result Value   APPearance HAZY (*)    Leukocytes, UA TRACE (*)    Squamous Epithelial / LPF 0-5 (*)    All other components within normal limits  CBC WITH DIFFERENTIAL/PLATELET - Abnormal; Notable for the following:    RBC 3.48 (*)    Hemoglobin 9.7 (*)    HCT 30.2 (*)    All other components within normal limits  COMPREHENSIVE METABOLIC PANEL - Abnormal; Notable for the following:    Glucose, Bld 188 (*)    BUN 22 (*)    Creatinine, Ser 1.17 (*)    Total Protein 6.4 (*)    Total Bilirubin 0.2 (*)    GFR calc non Af Amer 48 (*)    GFR calc Af Amer 55 (*)    All other components within normal limits  CK - Abnormal; Notable for the following:    Total CK 252 (*)    All other components within normal limits    EKG  EKG Interpretation None       Radiology No results found.  Procedures Procedures (including critical care time)  Medications Ordered in ED Medications - No data to display   Initial Impression / Assessment and Plan / ED  Course  I have reviewed the triage vital signs and the nursing notes.  Pertinent labs & imaging results that were available during my care of the patient were reviewed by me and considered in my medical decision making (see chart for details).     No evidence of rhabdomyolysis. No electrolyte derangements. No evidence of infectious process. No focal deficits on exam. Possible exacerbation of the patient's fibromyalgia. The patient appears reasonably screened and/or stabilized for discharge and I doubt any other medical condition or other [facility] requiring further screening, evaluation, or treatment in the ED at this time prior to discharge.   Final Clinical Impressions(s) / ED Diagnoses   Final diagnoses:  Myalgia   Disposition: Discharge  Condition: Good  I have discussed the results, Dx and Tx plan with the patient who expressed understanding and agree(s) with the plan. Discharge instructions discussed at great length. The patient was given strict return precautions who verbalized understanding of the instructions. No further questions at time of discharge.  Discharge Medication List as of 08/08/2017  7:59 AM      Follow Up: Elwyn Reach, MD [place] [place] [address] [phone]  Schedule an appointment as soon as possible for a visit  in 5-7 days, If symptoms do not improve or  worsen      Dyllan Hughett, Grayce Sessions, MD 08/08/17 1719

## 2017-08-08 NOTE — ED Triage Notes (Signed)
Patient reports bilateral thigh muscle pain and low back pain onset yesterday unrelieved by OTC pain medications , denies injury , ambulatory , no dysuria or hematuria .

## 2017-08-08 NOTE — Discharge Instructions (Signed)
Please keep close monitor on your blood sugar levels. Initial you are hydrating appropriately.

## 2017-09-21 ENCOUNTER — Emergency Department (HOSPITAL_COMMUNITY)
Admission: EM | Admit: 2017-09-21 | Discharge: 2017-09-21 | Disposition: A | Discharge status: Home / Self Care | Payer: Medicare Other | Attending: Emergency Medicine | Admitting: Emergency Medicine

## 2017-09-21 DIAGNOSIS — Z91040 Latex allergy status: Secondary | ICD-10-CM | POA: Insufficient documentation

## 2017-09-21 DIAGNOSIS — E119 Type 2 diabetes mellitus without complications: Secondary | ICD-10-CM | POA: Insufficient documentation

## 2017-09-21 DIAGNOSIS — Z79899 Other long term (current) drug therapy: Secondary | ICD-10-CM | POA: Insufficient documentation

## 2017-09-21 DIAGNOSIS — Z7984 Long term (current) use of oral hypoglycemic drugs: Secondary | ICD-10-CM | POA: Diagnosis not present

## 2017-09-21 DIAGNOSIS — Z7982 Long term (current) use of aspirin: Secondary | ICD-10-CM | POA: Insufficient documentation

## 2017-09-21 DIAGNOSIS — R220 Localized swelling, mass and lump, head: Secondary | ICD-10-CM

## 2017-09-21 DIAGNOSIS — Z87891 Personal history of nicotine dependence: Secondary | ICD-10-CM | POA: Diagnosis not present

## 2017-09-21 DIAGNOSIS — I10 Essential (primary) hypertension: Secondary | ICD-10-CM | POA: Insufficient documentation

## 2017-09-21 DIAGNOSIS — J449 Chronic obstructive pulmonary disease, unspecified: Secondary | ICD-10-CM | POA: Diagnosis not present

## 2017-09-21 MED ORDER — LIDOCAINE VISCOUS 2 % MT SOLN: 15.0000 mL | OROMUCOSAL | 0 refills | Status: AC | PRN

## 2017-09-21 NOTE — ED Triage Notes (Addendum)
Patient complaining of left facial swelling and both feet swelling. Patient is on diuretic. Patient has no other complaints. Hx of angioedema.

## 2017-09-21 NOTE — Discharge Instructions (Signed)
Please return to the Emergency Department if you develop worsening facial swelling with difficulty breathing, if you develop lip swelling or feel like your throat is closing.   Please continue to take the blood pressure medicine that is prescribed to you. Your blood pressure was elevated in the ER today. Please schedule an appointment with your primary doctor for recheck.   I have written you a prescription for viscous lidocaine which is a medicine that will numb your mouth over the spot that looks like you've bitten down on the inside of your cheek. Please swish and spit.

## 2017-09-21 NOTE — ED Provider Notes (Signed)
[facility] DEPT Provider Note   CSN: [id number] Arrival date & time: 09/21/17  0240     History   Chief Complaint Chief Complaint  Patient presents with  . Foot Swelling  . Facial Swelling    HPI [name] is a 66 y.o. female.  HPI   [name] is a 66yo female with a history of Bells Palsy, hypertension, chronic renal insufficiency, type 2 diabetes mellitus, COPD, GERD who presents to the Emergency Department for evaluation of left sided face and tongue swelling and bilateral ankle swelling. Patient states that upon waking this morning she noticed that her left cheek, tongue and neck was swollen. No shortness of breath or feeling of throat closing. She states that she took benedryl and the swelling has improved dramatically. At this time she states that the left side of her mouth is a little bit sore, no dental pain or fevers. No dysarthria, dysphagia, diplopia, numbness, weakness. She has a history of angioedema about 74yrs ago after taking lisinopril. States that she has been taking atenolol and triamterene-HCTZ since that time. No recent change in her blood pressure medication or in any other medications recently. She denies food allergies, denies consuming any new foods. She does not recall being bitten. States that she feels a little bump inside her right cheek. She also reports bilateral ankle swelling which began yesterday but has improved with elevating her legs. No pain in the ankles or injury reported.   Past Medical History:  Diagnosis Date  . Ankle edema BILATERAL   takes Lasix daily  . Arthritis    BACK, KNEE, HIPS  . Asthma   . Cancer ([place])   . Cataracts, bilateral    immature  . Chronic back pain    d/t injury in 1995  . Chronic bronchitis   . Chronic pain syndrome   . COPD (chronic obstructive pulmonary disease) (HCC) CHRONIC BRONCHITIS --  LAST BOUT  BILATERAL PNEUMONIA 2009   uses QVAR daily and Albuterol daily as needed  . Diabetes mellitus without  complication ([place])    takes Glipizide and Metformin daily  . Diverticulosis   . Fibromyalgia   . Frequency of urination   . GERD (gastroesophageal reflux disease)    takes Nexium daily   . H/O hiatal hernia   . Headache    frequently but related to sinus  . Hepatitis    hx of.Not sure if A,B,C  . History of blood transfusion    no abnormal reaction noted  . History of colon polyps    benign  . History of gastric ulcer   . History of glaucoma STATES TREATED FOR ABOUT 5 YRS  AGO  --- NO ISSUES SINCE  . Hyperlipidemia    was on meds but has been off x 2 months  . Hypertension    takes Atenlol and Triamtere  daily   . Iron deficiency anemia    takes Iron pill daily  . Joint pain   . Joint swelling   . Nocturia   . Peripheral neuropathy    takes Gabapentin daily  . Pneumonia 2010   hx of  . PTSD (post-traumatic stress disorder)    hx of-no meds  . Seasonal allergies    takes Zyrtec daily and uses Flonase daily as needed  . Stress incontinence, female   . Urgency of urination   . Vitamin D deficiency    takes Vit D daily    Patient Active Problem List   Diagnosis Date  Noted  . Chest pain 01/18/2014  . Sarcoidosis   . Positive TB test   . History of Bell's palsy   . Pelvic pain in female   . Stress incontinence, female   . Nocturia   . History of glaucoma   . Uterine fibroid   . Hyperlipidemia   . COPD (chronic obstructive pulmonary disease) ([place])   . Iron deficiency anemia   . H/O hiatal hernia   . Fibromyalgia   . Arthritis   . Chronic renal insufficiency   . Diabetes mellitus without complication [facility])     Past Surgical History:  Procedure Laterality Date  . BILATERAL BENIGN BREAST TUMOR REMOVED  YRS AGO  . BREAST SURGERY     2 cyst removed from right breast and tumor removed from left breast  . CARDIAC CATHETERIZATION  09-09-2008   NORMAL CORONARY ARTERIES/ NORMAL LVSF/ CHRONIC RENAL INSUFF.  Marland Kitchen COLONOSCOPY    . CYSTO WITH HYDRODISTENSION  01/07/2012     Procedure: CYSTOSCOPY/HYDRODISTENSION;  Surgeon: Reece Packer, MD;  [facility];  Service: Urology;  Laterality: N/A;   OF BLADDER WITH INSTILLATION OF MARCAINE AND PRYDIUM  . MUSCLE BIOPSY Left 10/04/2015   Procedure: LEFT Vastus Lateralis Muscle Biopsy;  Surgeon: Ashok Pall, MD;  Location: [facility] NEURO ORS;  Service: Neurosurgery;  Laterality: Left;  LEFT vastus lateralis muscle biopsy  . neck cyst    . TUBAL LIGATION  YRS AGO    OB History    No data available       Home Medications    Prior to Admission medications   Medication Sig Start Date End Date Taking? Authorizing Provider  albuterol (PROVENTIL HFA;VENTOLIN HFA) 108 (90 BASE) MCG/ACT inhaler Inhale 2 puffs into the lungs every 6 (six) hours as needed for wheezing or shortness of breath. Shortness of breath   Yes [provider]  aspirin-acetaminophen-caffeine (EXCEDRIN EXTRA STRENGTH) [phone] MG per tablet Take 2 tablets by mouth every 6 (six) hours as needed for pain.   Yes [provider]  atenolol (TENORMIN) 50 MG tablet Take 75 mg by mouth daily.   Yes [provider]  cetirizine (ZYRTEC) 10 MG tablet Take 10 mg by mouth daily.   Yes [provider]  Cholecalciferol (VITAMIN D) 2000 UNITS CAPS Take 2,000 Units by mouth daily.   Yes [provider]  CYANOCOBALAMIN PO Take 1 tablet by mouth daily.   Yes [provider]  esomeprazole (NEXIUM) 40 MG capsule Take 40 mg by mouth daily before breakfast.   Yes [provider]  Ferrous Gluconate (IRON) 240 (27 FE) MG TABS Take 240 mg by mouth daily.    Yes [provider]  fluticasone (FLONASE) 50 MCG/ACT nasal spray Place 2 sprays into the nose as needed for allergies. congestion   Yes [provider]  gabapentin (NEURONTIN) 600 MG tablet Take 600 mg by mouth 3 (three) times daily.   Yes [provider]  ibuprofen (ADVIL,MOTRIN) 600 MG tablet Take 600 mg by  mouth every 6 (six) hours as needed for headache.   Yes [provider]  metFORMIN (GLUCOPHAGE) 850 MG tablet Take 850 mg by mouth 2 (two) times daily with a meal.   Yes [provider]  Multiple Vitamin (MULTIVITAMIN WITH MINERALS) TABS tablet Take 1 tablet by mouth daily.   Yes [provider]  Omega-3 Fatty Acids (FISH OIL PO) Take 1 tablet by mouth daily.   Yes [provider]  Oxycodone HCl  20 MG TABS Take 20 mg by mouth every 4 (four) hours. 09/12/17  Yes [provider]  triamterene-hydrochlorothiazide (MAXZIDE-25) 37.5-25 MG per tablet Take 1 tablet by mouth daily.  01/13/14  Yes [provider]  VOLTAREN 1 % GEL Apply 1 application topically 3 (three) times daily as needed. Pain. Apply to knees, hands, and ankles. 02/10/15  Yes [provider]  cephALEXin (KEFLEX) 500 MG capsule Take 1 capsule (500 mg total) by mouth 2 (two) times daily. Patient not taking: Reported on 10/14/2016 02/07/16   Everlene Balls, MD  doxycycline (VIBRAMYCIN) 100 MG capsule Take 1 capsule (100 mg total) by mouth 2 (two) times daily. One po bid x 5 days Patient not taking: Reported on 10/14/2016 02/07/16   Everlene Balls, MD  HYDROcodone-acetaminophen (NORCO/VICODIN) 5-325 MG tablet Take 1 tablet by mouth every 6 (six) hours as needed for moderate pain. Patient not taking: Reported on 10/14/2016 10/04/15   Ashok Pall, MD  lidocaine (XYLOCAINE) 2 % solution Use as directed 15 mLs in the mouth or throat as needed for mouth pain. 09/21/17   Glyn Ade, PA-C    Family History Family History  Problem Relation Age of Onset  . Cancer Mother   . Diabetes Mother   . Hypertension Mother   . Cancer Sister   . Hypertension Sister   . Hypertension Son     Social History Social History  Substance Use Topics  . Smoking status: Former Smoker    Packs/day: 1.00    Years: 27.00    Types: Cigarettes  . Smokeless tobacco: Never Used     Comment: quit  smoking in 1993  . Alcohol use No     Comment: quit drinking 25+yrs ago     Allergies   Avapro [irbesartan]; Lisinopril; and Latex   Review of Systems Review of Systems  Constitutional: Negative for chills, diaphoresis and fever.  HENT: Positive for facial swelling (left sided) and mouth sores (she states she feels a lump in her mouth). Negative for dental problem, hearing loss, sinus pain, sinus pressure, sore throat, trouble swallowing and voice change.   Eyes: Negative for visual disturbance.  Respiratory: Negative for chest tightness, shortness of breath and wheezing.   Cardiovascular: Negative for chest pain.  Gastrointestinal: Negative for abdominal pain, diarrhea, nausea and vomiting.  Genitourinary: Negative for dysuria.  Musculoskeletal: Negative for gait problem.  Skin: Negative for rash and wound.  Neurological: Negative for dizziness, speech difficulty, weakness, light-headedness, numbness and headaches.  Psychiatric/Behavioral: Negative for agitation and confusion.     Physical Exam Updated Vital Signs BP (!) 136/91 (BP Location: Left Arm)   Pulse 66   Temp 97.9 F (36.6 C) (Oral)   Resp 18   Ht 5\' 10"  (1.778 m)   Wt 113.4 kg (250 lb)   SpO2 100%   BMI 35.87 kg/m   Physical Exam  Constitutional: She is oriented to person, place, and time. She appears well-developed and well-nourished.  HENT:  Head: Normocephalic and atraumatic.  Mouth/Throat: Oropharynx is clear and moist. No oropharyngeal exudate.  Mild swelling noted in the left cheek and neck. No overlying erythema, warmth. Small vesicular spot noted inside the upper left cheek, non-tender. No tongue swelling. Uvula midline.   Eyes: Pupils are equal, round, and reactive to light. Conjunctivae and EOM are normal. Right eye exhibits no discharge. Left eye exhibits no discharge.  Neck: Normal range of motion. Neck supple.  Cardiovascular: Normal rate and regular rhythm.  Exam reveals no friction  rub.   No  murmur heard. Pulmonary/Chest: Effort normal and breath sounds normal. No respiratory distress. She has no wheezes. She has no rales.  Abdominal: Soft. Bowel sounds are normal.  Musculoskeletal: Normal range of motion.  Mild ankle swelling noted in bilateral ankles, non-pitting. Patient with good active and passive ROM of ankles and knees bilaterally.   Neurological: She is alert and oriented to person, place, and time. Coordination normal.  Motor:  Normal tone. 5/5 strength in upper and lower extremities bilaterally including strong and equal grip strength and dorsiflexion/plantar flexion Sensory: Pinprick and light touch normal in bilateral lower extremities Gait: normal gait and balance  Skin: Skin is warm and dry.  Psychiatric: She has a normal mood and affect. Her behavior is normal.  Nursing note and vitals reviewed.    ED Treatments / Results  Labs (all labs ordered are listed, but only abnormal results are displayed) Labs Reviewed - No data to display  EKG  EKG Interpretation None       Radiology No results found.  Procedures Procedures (including critical care time)  Medications Ordered in ED Medications - No data to display   Initial Impression / Assessment and Plan / ED Course  I have reviewed the triage vital signs and the nursing notes.  Pertinent labs & imaging results that were available during my care of the patient were reviewed by me and considered in my medical decision making (see chart for details).    Patient with left sided tongue, cheek and neck swelling this morning which improved with benadryl. No throat closing, SOB. No anaphylaxis. Vital signs are stable. She has evidence of biting the inside of her mouth, potentially related to this incident today. Sent home with magic mouthwash. No new medications, foods or recent bug bite. Unclear of what caused her episode of swelling today. Have given her return precautions including lip swelling, throat  closing, trouble breathing, worsening of her facial swelling. Patient agrees and voices understanding. Ankle swelling is minimal, no pitting edema to suggest third spacing. Patient instructed to continue her blood pressure medications. Dr. Cleopatra Cedar also saw this patient and agrees with plan to discharge.   Final Clinical Impressions(s) / ED Diagnoses   Final diagnoses:  Facial swelling    New Prescriptions Discharge Medication List as of 09/21/2017  4:00 PM    START taking these medications   Details  lidocaine (XYLOCAINE) 2 % solution Use as directed 15 mLs in the mouth or throat as needed for mouth pain., Starting Sun 09/21/2017, Print         Glyn Ade, PA-C 09/21/17 2237    Isla Pence, MD 09/21/17 2358

## 2017-11-07 ENCOUNTER — Other Ambulatory Visit: Payer: Self-pay

## 2017-11-07 ENCOUNTER — Encounter (HOSPITAL_COMMUNITY): Payer: Self-pay | Admitting: Emergency Medicine

## 2017-11-07 ENCOUNTER — Ambulatory Visit (HOSPITAL_COMMUNITY)
Admission: EM | Admit: 2017-11-07 | Discharge: 2017-11-07 | Disposition: A | Discharge status: Home / Self Care | Payer: Medicare Other | Attending: Internal Medicine | Admitting: Internal Medicine

## 2017-11-07 DIAGNOSIS — M791 Myalgia, unspecified site: Secondary | ICD-10-CM

## 2017-11-07 DIAGNOSIS — R102 Pelvic and perineal pain: Secondary | ICD-10-CM | POA: Diagnosis not present

## 2017-11-07 LAB — POCT URINALYSIS DIP (DEVICE)
Glucose, UA: NEGATIVE mg/dL
HGB URINE DIPSTICK: NEGATIVE
Leukocytes, UA: NEGATIVE
NITRITE: NEGATIVE
PH: 5.5 (ref 5.0–8.0)
Protein, ur: NEGATIVE mg/dL
Specific Gravity, Urine: >=1.03 (ref 1.005–1.030)
UROBILINOGEN UA: 0.2 mg/dL (ref 0.0–1.0)

## 2017-11-07 MED ORDER — KETOROLAC TROMETHAMINE 60 MG/2ML IM SOLN
60.0000 mg | Freq: Once | INTRAMUSCULAR | Status: AC
  Administered 2017-11-07: 60 mg via INTRAMUSCULAR

## 2017-11-07 MED ORDER — KETOROLAC TROMETHAMINE 60 MG/2ML IM SOLN
INTRAMUSCULAR | Status: AC
  Filled 2017-11-07: qty 2

## 2017-11-07 NOTE — Discharge Instructions (Signed)
Continue current medications.  See your Physician for recheck next week

## 2017-11-07 NOTE — ED Provider Notes (Signed)
[facility]    CSN: [id number] Arrival date & time: 11/07/17  1620     History   Chief Complaint Chief Complaint  Patient presents with  . Pelvic Pain    HPI [name] is a 66 y.o. female.   The history is provided by the patient. No language interpreter was used.  Pelvic Pain  This is a recurrent problem. The problem occurs constantly. The problem has not changed since onset.Nothing aggravates the symptoms. Nothing relieves the symptoms. She has tried nothing for the symptoms.  Pt reports she has chronic pain.  Pt reports she is sore in pelvic area like when she used to have endometriosis.  Pt reports she wanted to come in to make sure she does not have a uti,  Past Medical History:  Diagnosis Date  . Ankle edema BILATERAL   takes Lasix daily  . Arthritis    BACK, KNEE, HIPS  . Asthma   . Cancer ([place])   . Cataracts, bilateral    immature  . Chronic back pain    d/t injury in 1995  . Chronic bronchitis   . Chronic pain syndrome   . COPD (chronic obstructive pulmonary disease) (HCC) CHRONIC BRONCHITIS --  LAST BOUT  BILATERAL PNEUMONIA 2009   uses QVAR daily and Albuterol daily as needed  . Diabetes mellitus without complication ([place])    takes Glipizide and Metformin daily  . Diverticulosis   . Fibromyalgia   . Frequency of urination   . GERD (gastroesophageal reflux disease)    takes Nexium daily   . H/O hiatal hernia   . Headache    frequently but related to sinus  . Hepatitis    hx of.Not sure if A,B,C  . History of blood transfusion    no abnormal reaction noted  . History of colon polyps    benign  . History of gastric ulcer   . History of glaucoma STATES TREATED FOR ABOUT 5 YRS  AGO  --- NO ISSUES SINCE  . Hyperlipidemia    was on meds but has been off x 2 months  . Hypertension    takes Atenlol and Triamtere  daily   . Iron deficiency anemia    takes Iron pill daily  . Joint pain   . Joint swelling   . Nocturia   . Peripheral  neuropathy    takes Gabapentin daily  . Pneumonia 2010   hx of  . PTSD (post-traumatic stress disorder)    hx of-no meds  . Seasonal allergies    takes Zyrtec daily and uses Flonase daily as needed  . Stress incontinence, female   . Urgency of urination   . Vitamin D deficiency    takes Vit D daily    Patient Active Problem List   Diagnosis Date Noted  . Chest pain 01/18/2014  . Sarcoidosis   . Positive TB test   . History of Bell's palsy   . Pelvic pain in female   . Stress incontinence, female   . Nocturia   . History of glaucoma   . Uterine fibroid   . Hyperlipidemia   . COPD (chronic obstructive pulmonary disease) ([place])   . Iron deficiency anemia   . H/O hiatal hernia   . Fibromyalgia   . Arthritis   . Chronic renal insufficiency   . Diabetes mellitus without complication [facility])     Past Surgical History:  Procedure Laterality Date  . BILATERAL BENIGN BREAST TUMOR REMOVED  YRS  AGO  . BREAST SURGERY     2 cyst removed from right breast and tumor removed from left breast  . CARDIAC CATHETERIZATION  09-09-2008   NORMAL CORONARY ARTERIES/ NORMAL LVSF/ CHRONIC RENAL INSUFF.  Marland Kitchen COLONOSCOPY    . CYSTOSCOPY/HYDRODISTENSION N/A 01/07/2012   Performed by Reece Packer[facility] at [facility]  . LEFT Vastus Lateralis Muscle Biopsy Left 10/04/2015   Performed by Ashok Pall, MD at [facility] NEURO ORS  . neck cyst    . ROBOTIC ASSISTED TOTAL HYSTERECTOMY WITH BILATERAL SALPINGO OOPHORECTOMY Bilateral 06/22/2012   Performed by Catha Brow., MD at [facility] ORS  . TUBAL LIGATION  YRS AGO    OB History    No data available       Home Medications    Prior to Admission medications   Medication Sig Start Date End Date Taking? Authorizing Provider  albuterol (PROVENTIL HFA;VENTOLIN HFA) 108 (90 BASE) MCG/ACT inhaler Inhale 2 puffs into the lungs every 6 (six) hours as needed for wheezing or shortness of breath. Shortness of breath    [provider]    aspirin-acetaminophen-caffeine (EXCEDRIN EXTRA STRENGTH) [id number] MG per tablet Take 2 tablets by mouth every 6 (six) hours as needed for pain.    [provider]  atenolol (TENORMIN) 50 MG tablet Take 75 mg by mouth daily.    [provider]  cephALEXin (KEFLEX) 500 MG capsule Take 1 capsule (500 mg total) by mouth 2 (two) times daily. Patient not taking: Reported on 10/14/2016 02/07/16   Everlene Balls, MD  cetirizine (ZYRTEC) 10 MG tablet Take 10 mg by mouth daily.    [provider]  Cholecalciferol (VITAMIN D) 2000 UNITS CAPS Take 2,000 Units by mouth daily.    [provider]  CYANOCOBALAMIN PO Take 1 tablet by mouth daily.    [provider]  doxycycline (VIBRAMYCIN) 100 MG capsule Take 1 capsule (100 mg total) by mouth 2 (two) times daily. One po bid x 5 days Patient not taking: Reported on 10/14/2016 02/07/16   Everlene Balls, MD  esomeprazole (NEXIUM) 40 MG capsule Take 40 mg by mouth daily before breakfast.    [provider]  Ferrous Gluconate (IRON) 240 (27 FE) MG TABS Take 240 mg by mouth daily.     [provider]  fluticasone (FLONASE) 50 MCG/ACT nasal spray Place 2 sprays into the nose as needed for allergies. congestion    [provider]  gabapentin (NEURONTIN) 600 MG tablet Take 600 mg by mouth 3 (three) times daily.    [provider]  HYDROcodone-acetaminophen (NORCO/VICODIN) 5-325 MG tablet Take 1 tablet by mouth every 6 (six) hours as needed for moderate pain. Patient not taking: Reported on 10/14/2016 10/04/15   Ashok Pall, MD  ibuprofen (ADVIL,MOTRIN) 600 MG tablet Take 600 mg by mouth every 6 (six) hours as needed for headache.    [provider]  lidocaine (XYLOCAINE) 2 % solution Use as directed 15 mLs in the mouth or throat as needed for mouth pain. 09/21/17   Glyn Ade, PA-C  metFORMIN (GLUCOPHAGE) 850 MG tablet Take 850 mg by mouth 2 (two) times daily with a meal.     [provider]  Multiple Vitamin (MULTIVITAMIN WITH MINERALS) TABS tablet Take 1 tablet by mouth daily.    [provider]  Omega-3 Fatty Acids (FISH OIL PO) Take 1 tablet by mouth daily.    [provider]  Oxycodone HCl 20 MG TABS Take 20  mg by mouth every 4 (four) hours. 09/12/17   [provider]  triamterene-hydrochlorothiazide (MAXZIDE-25) 37.5-25 MG per tablet Take 1 tablet by mouth daily.  01/13/14   [provider]  VOLTAREN 1 % GEL Apply 1 application topically 3 (three) times daily as needed. Pain. Apply to knees, hands, and ankles. 02/10/15   [provider]    Family History Family History  Problem Relation Age of Onset  . Cancer Mother   . Diabetes Mother   . Hypertension Mother   . Cancer Sister   . Hypertension Sister   . Hypertension Son     Social History Social History   Tobacco Use  . Smoking status: Former Smoker    Packs/day: 1.00    Years: 27.00    Pack years: 27.00    Types: Cigarettes  . Smokeless tobacco: Never Used  . Tobacco comment: quit smoking in 1993  Substance Use Topics  . Alcohol use: No    Comment: quit drinking 25+yrs ago  . Drug use: No    Comment: nothing in 25+yrs      Allergies   Avapro [irbesartan]; Lisinopril; and Latex   Review of Systems Review of Systems  Genitourinary: Positive for pelvic pain.  All other systems reviewed and are negative.    Physical Exam Triage Vital Signs ED Triage Vitals  Enc Vitals Group     BP 11/07/17 1639 (!) 150/86     Pulse Rate 11/07/17 1639 70     Resp 11/07/17 1639 16     Temp 11/07/17 1639 98 F (36.7 C)     Temp src --      SpO2 11/07/17 1639 100 %     Weight --      Height --      Head Circumference --      Peak Flow --      Pain Score 11/07/17 1640 9     Pain Loc --      Pain Edu? --      Excl. in [place]? --    No data found.  Updated Vital Signs BP (!) 150/86   Pulse 70   Temp 98 F (36.7 C)   Resp 16   SpO2 100%    Visual Acuity Right Eye Distance:   Left Eye Distance:   Bilateral Distance:    Right Eye Near:   Left Eye Near:    Bilateral Near:     Physical Exam  Constitutional: She is oriented to person, place, and time. She appears well-developed and well-nourished.  HENT:  Head: Normocephalic.  Eyes: EOM are normal.  Neck: Normal range of motion.  Cardiovascular: Normal rate and regular rhythm.  Pulmonary/Chest: Effort normal.  Abdominal: She exhibits no distension.  Musculoskeletal: Normal range of motion.  Neurological: She is alert and oriented to person, place, and time.  Skin: Skin is warm.  Psychiatric: She has a normal mood and affect.  Nursing note and vitals reviewed.    UC Treatments / Results  Labs (all labs ordered are listed, but only abnormal results are displayed) Labs Reviewed  POCT URINALYSIS DIP (DEVICE) - Abnormal; Notable for the following components:      Result Value   Bilirubin Urine SMALL (*)    Ketones, ur TRACE (*)    All other components within normal limits    EKG  EKG Interpretation None       Radiology No results found.  Procedures Procedures (including critical care time)  Medications Ordered  in UC Medications  ketorolac (TORADOL) injection 60 mg (60 mg Intramuscular Given 11/07/17 1713)     Initial Impression / Assessment and Plan / UC Course  I have reviewed the triage vital signs and the nursing notes.  Pertinent labs & imaging results that were available during my care of the patient were reviewed by me and considered in my medical decision making (see chart for details).      Final Clinical Impressions(s) / UC Diagnoses   Final diagnoses:  Muscle ache  Pelvic pain in female    ED Discharge Orders    None       Controlled Substance Prescriptions [place] Controlled Substance Registry consulted? Not Applicable  An After Visit Summary was printed and given to the patient.  Pt given injection of torodol.  Pt advised  to continue her home medications.   Fransico Meadow, [place] 11/07/17 1756

## 2017-11-07 NOTE — ED Triage Notes (Signed)
Pt c/o pain in her pelvic area x1 week, pt thinks she has a UTI.

## 2018-05-16 ENCOUNTER — Other Ambulatory Visit: Payer: Self-pay

## 2018-05-16 ENCOUNTER — Encounter (HOSPITAL_COMMUNITY): Payer: Self-pay

## 2018-05-16 ENCOUNTER — Inpatient Hospital Stay (HOSPITAL_COMMUNITY)
Admission: AD | Admit: 2018-05-16 | Discharge: 2018-05-16 | Disposition: A | Discharge status: Home / Self Care | Payer: Medicare Other | Source: Ambulatory Visit | Attending: Obstetrics and Gynecology | Admitting: Obstetrics and Gynecology

## 2018-05-16 DIAGNOSIS — H409 Unspecified glaucoma: Secondary | ICD-10-CM | POA: Insufficient documentation

## 2018-05-16 DIAGNOSIS — K589 Irritable bowel syndrome without diarrhea: Secondary | ICD-10-CM | POA: Insufficient documentation

## 2018-05-16 DIAGNOSIS — J449 Chronic obstructive pulmonary disease, unspecified: Secondary | ICD-10-CM | POA: Insufficient documentation

## 2018-05-16 DIAGNOSIS — Z79899 Other long term (current) drug therapy: Secondary | ICD-10-CM | POA: Diagnosis not present

## 2018-05-16 DIAGNOSIS — R102 Pelvic and perineal pain: Secondary | ICD-10-CM | POA: Diagnosis present

## 2018-05-16 DIAGNOSIS — K219 Gastro-esophageal reflux disease without esophagitis: Secondary | ICD-10-CM | POA: Insufficient documentation

## 2018-05-16 DIAGNOSIS — Z86010 Personal history of colonic polyps: Secondary | ICD-10-CM | POA: Diagnosis not present

## 2018-05-16 DIAGNOSIS — G894 Chronic pain syndrome: Secondary | ICD-10-CM | POA: Diagnosis not present

## 2018-05-16 DIAGNOSIS — I10 Essential (primary) hypertension: Secondary | ICD-10-CM | POA: Insufficient documentation

## 2018-05-16 DIAGNOSIS — E559 Vitamin D deficiency, unspecified: Secondary | ICD-10-CM | POA: Insufficient documentation

## 2018-05-16 DIAGNOSIS — Z87891 Personal history of nicotine dependence: Secondary | ICD-10-CM | POA: Insufficient documentation

## 2018-05-16 DIAGNOSIS — Z7984 Long term (current) use of oral hypoglycemic drugs: Secondary | ICD-10-CM | POA: Insufficient documentation

## 2018-05-16 DIAGNOSIS — R1030 Lower abdominal pain, unspecified: Secondary | ICD-10-CM

## 2018-05-16 DIAGNOSIS — G629 Polyneuropathy, unspecified: Secondary | ICD-10-CM | POA: Diagnosis not present

## 2018-05-16 DIAGNOSIS — F4310 Post-traumatic stress disorder, unspecified: Secondary | ICD-10-CM | POA: Diagnosis not present

## 2018-05-16 DIAGNOSIS — M797 Fibromyalgia: Secondary | ICD-10-CM | POA: Insufficient documentation

## 2018-05-16 DIAGNOSIS — E119 Type 2 diabetes mellitus without complications: Secondary | ICD-10-CM | POA: Diagnosis not present

## 2018-05-16 DIAGNOSIS — Z90710 Acquired absence of both cervix and uterus: Secondary | ICD-10-CM | POA: Insufficient documentation

## 2018-05-16 DIAGNOSIS — Z7982 Long term (current) use of aspirin: Secondary | ICD-10-CM | POA: Insufficient documentation

## 2018-05-16 DIAGNOSIS — E785 Hyperlipidemia, unspecified: Secondary | ICD-10-CM | POA: Insufficient documentation

## 2018-05-16 DIAGNOSIS — D509 Iron deficiency anemia, unspecified: Secondary | ICD-10-CM | POA: Diagnosis not present

## 2018-05-16 LAB — URINALYSIS, ROUTINE W REFLEX MICROSCOPIC
BILIRUBIN URINE: NEGATIVE
Bacteria, UA: NONE SEEN
Glucose, UA: NEGATIVE mg/dL
HGB URINE DIPSTICK: NEGATIVE
Ketones, ur: NEGATIVE mg/dL
Nitrite: NEGATIVE
PROTEIN: NEGATIVE mg/dL
SPECIFIC GRAVITY, URINE: 1.011 (ref 1.005–1.030)
pH: 7 (ref 5.0–8.0)

## 2018-05-16 LAB — CBC WITH DIFFERENTIAL/PLATELET
Basophils Absolute: 0 10*3/uL (ref 0.0–0.1)
Basophils Relative: 1 %
Eosinophils Absolute: 0.1 10*3/uL (ref 0.0–0.7)
Eosinophils Relative: 1 %
HCT: 34.6 % — ABNORMAL LOW (ref 36.0–46.0)
HEMOGLOBIN: 11.2 g/dL — AB (ref 12.0–15.0)
LYMPHS ABS: 2.8 10*3/uL (ref 0.7–4.0)
LYMPHS PCT: 37 %
MCH: 28.4 pg (ref 26.0–34.0)
MCHC: 32.4 g/dL (ref 30.0–36.0)
MCV: 87.8 fL (ref 78.0–100.0)
Monocytes Absolute: 0.3 10*3/uL (ref 0.1–1.0)
Monocytes Relative: 4 %
NEUTROS PCT: 57 %
Neutro Abs: 4.3 10*3/uL (ref 1.7–7.7)
PLATELETS: 202 10*3/uL (ref 150–400)
RBC: 3.94 MIL/uL (ref 3.87–5.11)
RDW: 14.5 % (ref 11.5–15.5)
WBC: 7.6 10*3/uL (ref 4.0–10.5)

## 2018-05-16 NOTE — Discharge Instructions (Signed)

## 2018-05-16 NOTE — Progress Notes (Addendum)
67 y/o here for pelvic pain. Has hx of generalized body pain in which she she's pain specialist "every week".   Denies bleeding.   1420: lab at bs.   1423: provider at bs assessing   1503: provider at bs reassessing   1535: d/c instructions given with pt understanding. Pt left unit via ambulatory.

## 2018-05-16 NOTE — MAU Provider Note (Signed)
History     CSN: [id number]  Arrival date and time: 05/16/18 1350   None     Chief Complaint  Patient presents with  . Pelvic Pain   HPI [name] is 67 y.o.presents for evaluation of chronic abdominal/pelvic pain. Reports pain is in the mid lower abdomen beginning at the umbilicus and going straight down to pubis.  Thinks it may be UTI.  Denies hematuria, frequency, and dysuria.  There is diffuse mild tenderness "all over". Rates pain as a 9/10.  States she has a high tolerance for pain. She appears comfortable.  States she began with looser than normal stools 2 days ago, now back to normal.  Neg for fever, chills, N&V. Hx of hysterectomy.  Hx of IBS and multiple chronic medical diagnosis.  She is seen weekly at the pain clinic for pain. She came in today with her Granddaughter, who is being seen for vaginal bleeding in pregnancy.    Past Medical History:  Diagnosis Date  . Ankle edema BILATERAL   takes Lasix daily  . Arthritis    BACK, KNEE, HIPS  . Asthma   . Cancer ([place])   . Cataracts, bilateral    immature  . Chronic back pain    d/t injury in 1995  . Chronic bronchitis   . Chronic pain syndrome   . COPD (chronic obstructive pulmonary disease) (HCC) CHRONIC BRONCHITIS --  LAST BOUT  BILATERAL PNEUMONIA 2009   uses QVAR daily and Albuterol daily as needed  . Diabetes mellitus without complication ([place])    takes Glipizide and Metformin daily  . Diverticulosis   . Fibromyalgia   . Frequency of urination   . GERD (gastroesophageal reflux disease)    takes Nexium daily   . H/O hiatal hernia   . Headache    frequently but related to sinus  . Hepatitis    hx of.Not sure if A,B,C  . History of blood transfusion    no abnormal reaction noted  . History of colon polyps    benign  . History of gastric ulcer   . History of glaucoma STATES TREATED FOR ABOUT 5 YRS  AGO  --- NO ISSUES SINCE  . Hyperlipidemia    was on meds but has been off x 2 months  . Hypertension     takes Atenlol and Triamtere  daily   . Iron deficiency anemia    takes Iron pill daily  . Joint pain   . Joint swelling   . Nocturia   . Peripheral neuropathy    takes Gabapentin daily  . Pneumonia 2010   hx of  . PTSD (post-traumatic stress disorder)    hx of-no meds  . Seasonal allergies    takes Zyrtec daily and uses Flonase daily as needed  . Stress incontinence, female   . Urgency of urination   . Vitamin D deficiency    takes Vit D daily    Past Surgical History:  Procedure Laterality Date  . ABDOMINAL HYSTERECTOMY     July 2013  . BILATERAL BENIGN BREAST TUMOR REMOVED  YRS AGO  . BREAST SURGERY     2 cyst removed from right breast and tumor removed from left breast  . CARDIAC CATHETERIZATION  09-09-2008   NORMAL CORONARY ARTERIES/ NORMAL LVSF/ CHRONIC RENAL INSUFF.  Marland Kitchen COLONOSCOPY    . CYSTO WITH HYDRODISTENSION  01/07/2012   Procedure: CYSTOSCOPY/HYDRODISTENSION;  Surgeon: Reece Packer, MD;  Location: [facility];  Service: Urology;  Laterality: N/A;   OF BLADDER WITH INSTILLATION OF MARCAINE AND PRYDIUM  . MUSCLE BIOPSY Left 10/04/2015   Procedure: LEFT Vastus Lateralis Muscle Biopsy;  Surgeon: Ashok Pall, MD;  Location: [place] [facility];  Service: Neurosurgery;  Laterality: Left;  LEFT vastus lateralis muscle biopsy  . neck cyst    . TUBAL LIGATION  YRS AGO    Family History  Problem Relation Age of Onset  . Cancer Mother   . Diabetes Mother   . Hypertension Mother   . Cancer Sister   . Hypertension Sister   . Hypertension Son     Social History   Tobacco Use  . Smoking status: Former Smoker    Packs/day: 1.00    Years: 27.00    Pack years: 27.00    Types: Cigarettes  . Smokeless tobacco: Never Used  . Tobacco comment: quit smoking in 1993  Substance Use Topics  . Alcohol use: No    Comment: quit drinking 25+yrs ago  . Drug use: No    Comment: nothing in 25+yrs     Allergies:  Allergies  Allergen Reactions  . Avapro  [Irbesartan] Other (See Comments)    ANGIOEDEMA  . Lisinopril Other (See Comments)    ANGIOEDEMA  . Latex Swelling and Rash    Medications Prior to Admission  Medication Sig Dispense Refill Last Dose  . albuterol (PROVENTIL HFA;VENTOLIN HFA) 108 (90 BASE) MCG/ACT inhaler Inhale 2 puffs into the lungs every 6 (six) hours as needed for wheezing or shortness of breath. Shortness of breath   09/21/2017 at Unknown time  . aspirin-acetaminophen-caffeine (EXCEDRIN EXTRA STRENGTH) 250-250-65 MG per tablet Take 2 tablets by mouth every 6 (six) hours as needed for pain.   09/21/2017 at Unknown time  . atenolol (TENORMIN) 50 MG tablet Take 75 mg by mouth daily.   09/21/2017 at 1130  . cephALEXin (KEFLEX) 500 MG capsule Take 1 capsule (500 mg total) by mouth 2 (two) times daily. (Patient not taking: Reported on 10/14/2016) 10 capsule 0 Not Taking  . cetirizine (ZYRTEC) 10 MG tablet Take 10 mg by mouth daily.   09/20/2017 at Unknown time  . Cholecalciferol (VITAMIN D) 2000 UNITS CAPS Take 2,000 Units by mouth daily.   09/20/2017 at Unknown time  . CYANOCOBALAMIN PO Take 1 tablet by mouth daily.   09/20/2017 at Unknown time  . doxycycline (VIBRAMYCIN) 100 MG capsule Take 1 capsule (100 mg total) by mouth 2 (two) times daily. One po bid x 5 days (Patient not taking: Reported on 10/14/2016) 10 capsule 0 Not Taking  . esomeprazole (NEXIUM) 40 MG capsule Take 40 mg by mouth daily before breakfast.   09/21/2017 at Unknown time  . Ferrous Gluconate (IRON) 240 (27 FE) MG TABS Take 240 mg by mouth daily.    09/20/2017 at Unknown time  . fluticasone (FLONASE) 50 MCG/ACT nasal spray Place 2 sprays into the nose as needed for allergies. congestion   Past Week at Unknown time  . gabapentin (NEURONTIN) 600 MG tablet Take 600 mg by mouth 3 (three) times daily.   09/20/2017 at Unknown time  . HYDROcodone-acetaminophen (NORCO/VICODIN) 5-325 MG tablet Take 1 tablet by mouth every 6 (six) hours as needed for moderate pain. (Patient not  taking: Reported on 10/14/2016) 50 tablet 0 Not Taking  . ibuprofen (ADVIL,MOTRIN) 600 MG tablet Take 600 mg by mouth every 6 (six) hours as needed for headache.   Past Week at Unknown time  . lidocaine (XYLOCAINE) 2 % solution Use as  directed 15 mLs in the mouth or throat as needed for mouth pain. 200 mL 0   . metFORMIN (GLUCOPHAGE) 850 MG tablet Take 850 mg by mouth 2 (two) times daily with a meal.   09/20/2017 at Unknown time  . Multiple Vitamin (MULTIVITAMIN WITH MINERALS) TABS tablet Take 1 tablet by mouth daily.   09/20/2017 at Unknown time  . Omega-3 Fatty Acids (FISH OIL PO) Take 1 tablet by mouth daily.   09/20/2017 at Unknown time  . Oxycodone HCl 20 MG TABS Take 20 mg by mouth every 4 (four) hours.  0 09/21/2017 at Unknown time  . triamterene-hydrochlorothiazide (MAXZIDE-25) 37.5-25 MG per tablet Take 1 tablet by mouth daily.    09/21/2017 at Unknown time  . VOLTAREN 1 % GEL Apply 1 application topically 3 (three) times daily as needed. Pain. Apply to knees, hands, and ankles.  0 09/20/2017 at Unknown time    Review of Systems  Constitutional: Positive for appetite change (has not eaten today). Negative for activity change, chills and fever.       She appears very comfortable during visit  Respiratory: Negative for shortness of breath.   Cardiovascular: Negative for chest pain.  Gastrointestinal: Positive for abdominal pain (diffuse "all over" but more from umbilicus to pubis. ) and diarrhea (looser stools 2 days ago). Negative for nausea and vomiting.  Genitourinary: Positive for pelvic pain. Negative for dysuria, flank pain, frequency, hematuria, vaginal bleeding, vaginal discharge and vaginal pain.  Musculoskeletal: Positive for arthralgias (chronic).  Psychiatric/Behavioral: Negative for behavioral problems.   Physical Exam   Temperature (!) 97.5 F (36.4 C), temperature source Axillary, resp. rate 18.  Physical Exam  Nursing note and vitals reviewed. Constitutional: She is  oriented to person, place, and time. She appears well-developed and well-nourished. No distress.  HENT:  Head: Normocephalic.  Neck: Normal range of motion.  Cardiovascular: Normal rate.  Respiratory: Effort normal.  GI: Soft. She exhibits no distension and no mass. There is tenderness (mild suprapubic tenderness.  ). There is no rebound and no guarding.  Genitourinary: There is no rash, tenderness or lesion on the right labia. There is no rash, tenderness or lesion on the left labia. Right adnexum displays no tenderness. Left adnexum displays no tenderness. No erythema, tenderness or bleeding in the vagina. No vaginal discharge found.  Genitourinary Comments: Cervix and uterus surgically absent.  Neurological: She is alert and oriented to person, place, and time.  Skin: Skin is warm and dry.  Psychiatric: She has a normal mood and affect. Her behavior is normal. Judgment and thought content normal.   Results for orders placed or performed during the hospital encounter of 05/16/18 (from the past 24 hour(s))  Urinalysis, Routine w reflex microscopic     Status: Abnormal   Collection Time: 05/16/18  1:56 PM  Result Value Ref Range   Color, Urine YELLOW YELLOW   APPearance CLEAR CLEAR   Specific Gravity, Urine 1.011 1.005 - 1.030   pH 7.0 5.0 - 8.0   Glucose, UA NEGATIVE NEGATIVE mg/dL   Hgb urine dipstick NEGATIVE NEGATIVE   Bilirubin Urine NEGATIVE NEGATIVE   Ketones, ur NEGATIVE NEGATIVE mg/dL   Protein, ur NEGATIVE NEGATIVE mg/dL   Nitrite NEGATIVE NEGATIVE   Leukocytes, UA SMALL (A) NEGATIVE   RBC / HPF 0-5 0 - 5 RBC/hpf   WBC, UA 0-5 0 - 5 WBC/hpf   Bacteria, UA NONE SEEN NONE SEEN  CBC with Differential/Platelet     Status: Abnormal   Collection  Time: 05/16/18  2:19 PM  Result Value Ref Range   WBC 7.6 4.0 - 10.5 K/uL   RBC 3.94 3.87 - 5.11 MIL/uL   Hemoglobin 11.2 (L) 12.0 - 15.0 g/dL   HCT 34.6 (L) 36.0 - 46.0 %   MCV 87.8 78.0 - 100.0 fL   MCH 28.4 26.0 - 34.0 pg   MCHC  32.4 30.0 - 36.0 g/dL   RDW 14.5 11.5 - 15.5 %   Platelets 202 150 - 400 K/uL   Neutrophils Relative % 57 %   Neutro Abs 4.3 1.7 - 7.7 K/uL   Lymphocytes Relative 37 %   Lymphs Abs 2.8 0.7 - 4.0 K/uL   Monocytes Relative 4 %   Monocytes Absolute 0.3 0.1 - 1.0 K/uL   Eosinophils Relative 1 %   Eosinophils Absolute 0.1 0.0 - 0.7 K/uL   Basophils Relative 1 %   Basophils Absolute 0.0 0.0 - 0.1 K/uL   MAU Course  Procedures  MDM MSE EXam Labs Discussed exam and lab findings with the patient.   Assessment and Plan  A:  Lower abdominal pain      She reports hx of IBS      Hx of hysterectomy      Multiple medical diagnosis     P:Suggested she stay well hydrated, add fiber to diet to keep bowels normal     If pain worsens she may need to be seen in the ED over the weekend or PCP next week.   Waynetta Sandy Key 05/16/2018, 3:00 PM

## 2018-09-17 ENCOUNTER — Emergency Department (HOSPITAL_COMMUNITY): Payer: Medicare Other

## 2018-09-17 ENCOUNTER — Encounter (HOSPITAL_COMMUNITY): Payer: Self-pay

## 2018-09-17 ENCOUNTER — Encounter (HOSPITAL_COMMUNITY): Payer: Self-pay | Admitting: Family Medicine

## 2018-09-17 ENCOUNTER — Ambulatory Visit (INDEPENDENT_AMBULATORY_CARE_PROVIDER_SITE_OTHER)
Admission: EM | Admit: 2018-09-17 | Discharge: 2018-09-17 | Disposition: A | Discharge status: Home / Self Care | Payer: Medicare Other | Source: Home / Self Care

## 2018-09-17 ENCOUNTER — Other Ambulatory Visit: Payer: Self-pay

## 2018-09-17 ENCOUNTER — Emergency Department (HOSPITAL_COMMUNITY)
Admission: EM | Admit: 2018-09-17 | Discharge: 2018-09-19 | Disposition: A | Discharge status: Home / Self Care | Payer: Medicare Other | Attending: Emergency Medicine | Admitting: Emergency Medicine

## 2018-09-17 DIAGNOSIS — I10 Essential (primary) hypertension: Secondary | ICD-10-CM | POA: Insufficient documentation

## 2018-09-17 DIAGNOSIS — Z91040 Latex allergy status: Secondary | ICD-10-CM | POA: Diagnosis not present

## 2018-09-17 DIAGNOSIS — Z859 Personal history of malignant neoplasm, unspecified: Secondary | ICD-10-CM | POA: Insufficient documentation

## 2018-09-17 DIAGNOSIS — E119 Type 2 diabetes mellitus without complications: Secondary | ICD-10-CM | POA: Diagnosis not present

## 2018-09-17 DIAGNOSIS — Z7984 Long term (current) use of oral hypoglycemic drugs: Secondary | ICD-10-CM | POA: Insufficient documentation

## 2018-09-17 DIAGNOSIS — Z87891 Personal history of nicotine dependence: Secondary | ICD-10-CM | POA: Insufficient documentation

## 2018-09-17 DIAGNOSIS — J45909 Unspecified asthma, uncomplicated: Secondary | ICD-10-CM | POA: Diagnosis not present

## 2018-09-17 DIAGNOSIS — R0789 Other chest pain: Secondary | ICD-10-CM | POA: Diagnosis not present

## 2018-09-17 DIAGNOSIS — Z79899 Other long term (current) drug therapy: Secondary | ICD-10-CM | POA: Insufficient documentation

## 2018-09-17 DIAGNOSIS — R079 Chest pain, unspecified: Secondary | ICD-10-CM

## 2018-09-17 LAB — BASIC METABOLIC PANEL
ANION GAP: 12 (ref 5–15)
BUN: 12 mg/dL (ref 8–23)
CHLORIDE: 98 mmol/L (ref 98–111)
CO2: 30 mmol/L (ref 22–32)
Calcium: 10.1 mg/dL (ref 8.9–10.3)
Creatinine, Ser: 0.72 mg/dL (ref 0.44–1.00)
GFR calc Af Amer: >60 mL/min (ref >60)
GFR calc non Af Amer: >60 mL/min (ref >60)
GLUCOSE: 105 mg/dL — AB (ref 70–99)
POTASSIUM: 3.9 mmol/L (ref 3.5–5.1)
Sodium: 140 mmol/L (ref 135–145)

## 2018-09-17 LAB — I-STAT TROPONIN, ED: Troponin i, poc: 0 ng/mL (ref 0.00–0.08)

## 2018-09-17 LAB — CBC
HCT: 37.3 % (ref 36.0–46.0)
HEMOGLOBIN: 11.8 g/dL — AB (ref 12.0–15.0)
MCH: 28.3 pg (ref 26.0–34.0)
MCHC: 31.6 g/dL (ref 30.0–36.0)
MCV: 89.4 fL (ref 78.0–100.0)
Platelets: 235 10*3/uL (ref 150–400)
RBC: 4.17 MIL/uL (ref 3.87–5.11)
RDW: 14.6 % (ref 11.5–15.5)
WBC: 5.6 10*3/uL (ref 4.0–10.5)

## 2018-09-17 NOTE — ED Notes (Signed)
PT exhibits cardiac symptoms in lobby. Levine sign, states continues chest pain. When approached she states she does not want any more tests and just wants to see doctor. Is asking who her doctor will be.

## 2018-09-17 NOTE — Discharge Instructions (Addendum)
Please go to the ER for further evaluation of your chest pain 

## 2018-09-17 NOTE — ED Triage Notes (Signed)
Pt c/o CP X3 days. Was seen at PCP and then UC today. Pt walked down from UC. C/O central CP.

## 2018-09-17 NOTE — ED Triage Notes (Signed)
PT reports chest pain and left arm discomfort for 3 days. PT does have a cardiac history.

## 2018-09-17 NOTE — ED Notes (Signed)
Patient seeing trying to enter Triage area, when asked how this NT could be of assistance, patient stated that she was having severe chest pain and she wanted to know how long she would have to continue waiting. Upon checking the wait time it was brought to my attention that the patient still had multiple patients ahead of her. This NT offered to take the patient back and get another EKG but the patient refused multiple times. Patient stated that she only wanted her results, This NT explained to the patient that her results would have to be given to her by one of our medical providers once she was back in a room. This NT offered once more to take her to triage and get another EKG, however patient once again refused. Charge Nurse has been notified.

## 2018-09-17 NOTE — ED Provider Notes (Signed)
[place]    CSN: [id number] Arrival date & time: 09/17/18  1545     History   Chief Complaint Chief Complaint  Patient presents with  . Chest Pain    HPI [name] is a 67 y.o. female.   Barrett presents with complaints of central chest pain which has been constant for the past three days. Saw her PCP yesterday and was told her lungs are clear. She feels the pain has worsened since last night. Certain activities worsen it, or laying flat worsens the pain. Has left forearm pain which she is uncertain if is related to her carpal tunnel or not. States her left arm "Feels weak."  States has had chest pain  In the past but this feels more severe. Tried using her inhalers which did not help. No shortness of breath . No nausea or diaphoresis. States also had masses to her right breast and chest but this is not reproducible with palpation as those were. She is a previous smoker. Hx of DM. Hx of MI and htn. States she has been prescribed nitro in the past but does not have any currently. Takes oxycodone regularly for chronic pain. Hx of COPD, gerd, hiatal hernia, pneumonia.      ROS per HPI.      Past Medical History:  Diagnosis Date  . Ankle edema BILATERAL   takes Lasix daily  . Arthritis    BACK, KNEE, HIPS  . Asthma   . Cancer ([place])   . Cataracts, bilateral    immature  . Chronic back pain    d/t injury in 1995  . Chronic bronchitis   . Chronic pain syndrome   . COPD (chronic obstructive pulmonary disease) (HCC) CHRONIC BRONCHITIS --  LAST BOUT  BILATERAL PNEUMONIA 2009   uses QVAR daily and Albuterol daily as needed  . Diabetes mellitus without complication ([place])    takes Glipizide and Metformin daily  . Diverticulosis   . Fibromyalgia   . Frequency of urination   . GERD (gastroesophageal reflux disease)    takes Nexium daily   . H/O hiatal hernia   . Headache    frequently but related to sinus  . Hepatitis    hx of.Not sure if A,B,C  . History  of blood transfusion    no abnormal reaction noted  . History of colon polyps    benign  . History of gastric ulcer   . History of glaucoma STATES TREATED FOR ABOUT 5 YRS  AGO  --- NO ISSUES SINCE  . Hyperlipidemia    was on meds but has been off x 2 months  . Hypertension    takes Atenlol and Triamtere  daily   . Iron deficiency anemia    takes Iron pill daily  . Joint pain   . Joint swelling   . Nocturia   . Peripheral neuropathy    takes Gabapentin daily  . Pneumonia 2010   hx of  . PTSD (post-traumatic stress disorder)    hx of-no meds  . Seasonal allergies    takes Zyrtec daily and uses Flonase daily as needed  . Stress incontinence, female   . Urgency of urination   . Vitamin D deficiency    takes Vit D daily    Patient Active Problem List   Diagnosis Date Noted  . Chest pain 01/18/2014  . Sarcoidosis   . Positive TB test   . History of Bell's palsy   . Pelvic pain in  female   . Stress incontinence, female   . Nocturia   . History of glaucoma   . Uterine fibroid   . Hyperlipidemia   . COPD (chronic obstructive pulmonary disease) ([place])   . Iron deficiency anemia   . H/O hiatal hernia   . Fibromyalgia   . Arthritis   . Chronic renal insufficiency   . Diabetes mellitus without complication [facility])     Past Surgical History:  Procedure Laterality Date  . ABDOMINAL HYSTERECTOMY     July 2013  . BILATERAL BENIGN BREAST TUMOR REMOVED  YRS AGO  . BREAST SURGERY     2 cyst removed from right breast and tumor removed from left breast  . CARDIAC CATHETERIZATION  09-09-2008   NORMAL CORONARY ARTERIES/ NORMAL LVSF/ CHRONIC RENAL INSUFF.  Marland Kitchen COLONOSCOPY    . CYSTO WITH HYDRODISTENSION  01/07/2012   Procedure: CYSTOSCOPY/HYDRODISTENSION;  Surgeon: Reece Packer, MD;  [facility];  Service: Urology;  Laterality: N/A;   OF BLADDER WITH INSTILLATION OF MARCAINE AND PRYDIUM  . MUSCLE BIOPSY Left 10/04/2015   Procedure: LEFT Vastus Lateralis  Muscle Biopsy;  Surgeon: Ashok Pall, MD;  Location: [place] [facility];  Service: Neurosurgery;  Laterality: Left;  LEFT vastus lateralis muscle biopsy  . neck cyst    . TUBAL LIGATION  YRS AGO    OB History    Gravida  5   Para  5   Term  5   Preterm      AB      Living  5     SAB      TAB      Ectopic      Multiple      Live Births               Home Medications    Prior to Admission medications   Medication Sig Start Date End Date Taking? Authorizing Provider  albuterol (PROVENTIL HFA;VENTOLIN HFA) 108 (90 BASE) MCG/ACT inhaler Inhale 2 puffs into the lungs every 6 (six) hours as needed for wheezing or shortness of breath. Shortness of breath    [provider]  aspirin-acetaminophen-caffeine (EXCEDRIN EXTRA STRENGTH) [phone] MG per tablet Take 2 tablets by mouth every 6 (six) hours as needed for pain.    [provider]  atenolol (TENORMIN) 50 MG tablet Take 75 mg by mouth daily.    [provider]  cetirizine (ZYRTEC) 10 MG tablet Take 10 mg by mouth daily.    [provider]  Cholecalciferol (VITAMIN D) 2000 UNITS CAPS Take 2,000 Units by mouth daily.    [provider]  CYANOCOBALAMIN PO Take 1 tablet by mouth daily.    [provider]  esomeprazole (NEXIUM) 40 MG capsule Take 40 mg by mouth daily before breakfast.    [provider]  Ferrous Gluconate (IRON) 240 (27 FE) MG TABS Take 240 mg by mouth daily.     [provider]  fluticasone (FLONASE) 50 MCG/ACT nasal spray Place 2 sprays into the nose as needed for allergies. congestion    [provider]  gabapentin (NEURONTIN) 600 MG tablet Take 600 mg by mouth 3 (three) times daily.    [provider]  HYDROcodone-acetaminophen (NORCO/VICODIN) 5-325 MG tablet Take 1 tablet by mouth every 6 (six) hours as needed for moderate pain. Patient not taking: Reported on 10/14/2016 10/04/15   Ashok Pall, MD  ibuprofen  (ADVIL,MOTRIN) 600 MG tablet Take 600 mg by mouth every 6 (  six) hours as needed for headache.    [provider]  lidocaine (XYLOCAINE) 2 % solution Use as directed 15 mLs in the mouth or throat as needed for mouth pain. 09/21/17   Glyn Ade, PA-C  metFORMIN (GLUCOPHAGE) 850 MG tablet Take 850 mg by mouth 2 (two) times daily with a meal.    [provider]  Multiple Vitamin (MULTIVITAMIN WITH MINERALS) TABS tablet Take 1 tablet by mouth daily.    [provider]  Omega-3 Fatty Acids (FISH OIL PO) Take 1 tablet by mouth daily.    [provider]  Oxycodone HCl 20 MG TABS Take 20 mg by mouth every 4 (four) hours. 09/12/17   [provider]  triamterene-hydrochlorothiazide (MAXZIDE-25) 37.5-25 MG per tablet Take 1 tablet by mouth daily.  01/13/14   [provider]  VOLTAREN 1 % GEL Apply 1 application topically 3 (three) times daily as needed. Pain. Apply to knees, hands, and ankles. 02/10/15   [provider]    Family History Family History  Problem Relation Age of Onset  . Cancer Mother   . Diabetes Mother   . Hypertension Mother   . Cancer Sister   . Hypertension Sister   . Hypertension Son     Social History Social History   Tobacco Use  . Smoking status: Former Smoker    Packs/day: 1.00    Years: 27.00    Pack years: 27.00    Types: Cigarettes  . Smokeless tobacco: Never Used  . Tobacco comment: quit smoking in 1993  Substance Use Topics  . Alcohol use: No    Comment: quit drinking 25+yrs ago  . Drug use: No    Comment: nothing in 25+yrs      Allergies   Avapro [irbesartan]; Lisinopril; and Latex   Review of Systems Review of Systems   Physical Exam Triage Vital Signs ED Triage Vitals [09/17/18 1616]  Enc Vitals Group     BP 138/81     Pulse Rate 61     Resp (!) 6     Temp 98 F (36.7 C)     Temp src      SpO2 98 %     Weight      Height      Head Circumference      Peak Flow       Pain Score 6     Pain Loc      Pain Edu?      Excl. in [place]?    No data found.  Updated Vital Signs BP 138/81   Pulse 61   Temp 98 F (36.7 C)   Resp (!) 6   SpO2 98%    Physical Exam  Constitutional: She is oriented to person, place, and time. She appears well-developed and well-nourished. No distress.  Cardiovascular: Normal rate, regular rhythm and normal heart sounds.  Pulmonary/Chest: Effort normal and breath sounds normal.  Neurological: She is alert and oriented to person, place, and time.  Skin: Skin is warm and dry.   NSR with noted stdepression of uncertain chronicity   UC Treatments / Results  Labs (all labs ordered are listed, but only abnormal results are displayed) Labs Reviewed - No data to display  EKG None  Radiology No results found.  Procedures Procedures (including critical care time)  Medications Ordered in UC Medications - No data to display  Initial Impression / Assessment and Plan / UC Course  I have reviewed the triage vital  signs and the nursing notes.  Pertinent labs & imaging results that were available during my care of the patient were reviewed by me and considered in my medical decision making (see chart for details).     Active chest pain. Non toxic appearing. Cardiac risk factors with some stdepression noted. Recommend Er evaluation for this chest pain with transport provided by nursing staff. Patient verbalized understanding and agreeable to plan.    Final Clinical Impressions(s) / UC Diagnoses   Final diagnoses:  Chest pain, unspecified type     Discharge Instructions     Please go to the ER for further evaluation of your chest pain.    ED Prescriptions    None     Controlled Substance Prescriptions [place] Controlled Substance Registry consulted? Not Applicable   Zigmund Gottron, NP 09/17/18 1635

## 2018-09-18 NOTE — Discharge Instructions (Addendum)
Ibuprofen 600 mg 3 times daily for the next 3 to 4 days.  Return to the emergency department if you develop worsening pain, high fever, difficulty breathing, or other new and concerning symptoms.

## 2018-09-18 NOTE — ED Provider Notes (Signed)
[facility] EMERGENCY DEPARTMENT Provider Note   CSN: [id number] Arrival date & time: 09/17/18  [place]     History   Chief Complaint Chief Complaint  Patient presents with  . Chest Pain    HPI [name] is a 67 y.o. female.  Patient is a 67 year old female with past medical history of asthma, chronic back pain, and COPD.  She presents with complaints of chest pain.  This is been present for the past 3 days.  She describes it as sharp and located in her upper chest.  It is worse with movement and palpation.  She denies any shortness of breath, nausea, diaphoresis, or radiation to the arm or jaw.  She denies any exertional symptoms.  The history is provided by the patient.  Chest Pain   This is a new problem. Episode onset: 3 days ago. The problem occurs constantly. The problem has not changed since onset.The pain is moderate. The quality of the pain is described as sharp. The pain does not radiate.    Past Medical History:  Diagnosis Date  . Ankle edema BILATERAL   takes Lasix daily  . Arthritis    BACK, KNEE, HIPS  . Asthma   . Cancer ([place])   . Cataracts, bilateral    immature  . Chronic back pain    d/t injury in 1995  . Chronic bronchitis   . Chronic pain syndrome   . COPD (chronic obstructive pulmonary disease) (HCC) CHRONIC BRONCHITIS --  LAST BOUT  BILATERAL PNEUMONIA 2009   uses QVAR daily and Albuterol daily as needed  . Diabetes mellitus without complication ([place])    takes Glipizide and Metformin daily  . Diverticulosis   . Fibromyalgia   . Frequency of urination   . GERD (gastroesophageal reflux disease)    takes Nexium daily   . H/O hiatal hernia   . Headache    frequently but related to sinus  . Hepatitis    hx of.Not sure if A,B,C  . History of blood transfusion    no abnormal reaction noted  . History of colon polyps    benign  . History of gastric ulcer   . History of glaucoma STATES TREATED FOR ABOUT 5 YRS  AGO  --- NO  ISSUES SINCE  . Hyperlipidemia    was on meds but has been off x 2 months  . Hypertension    takes Atenlol and Triamtere  daily   . Iron deficiency anemia    takes Iron pill daily  . Joint pain   . Joint swelling   . Nocturia   . Peripheral neuropathy    takes Gabapentin daily  . Pneumonia 2010   hx of  . PTSD (post-traumatic stress disorder)    hx of-no meds  . Seasonal allergies    takes Zyrtec daily and uses Flonase daily as needed  . Stress incontinence, female   . Urgency of urination   . Vitamin D deficiency    takes Vit D daily    Patient Active Problem List   Diagnosis Date Noted  . Chest pain 01/18/2014  . Sarcoidosis   . Positive TB test   . History of Bell's palsy   . Pelvic pain in female   . Stress incontinence, female   . Nocturia   . History of glaucoma   . Uterine fibroid   . Hyperlipidemia   . COPD (chronic obstructive pulmonary disease) ([place])   . Iron deficiency anemia   .  H/O hiatal hernia   . Fibromyalgia   . Arthritis   . Chronic renal insufficiency   . Diabetes mellitus without complication [facility])     Past Surgical History:  Procedure Laterality Date  . ABDOMINAL HYSTERECTOMY     July 2013  . BILATERAL BENIGN BREAST TUMOR REMOVED  YRS AGO  . BREAST SURGERY     2 cyst removed from right breast and tumor removed from left breast  . CARDIAC CATHETERIZATION  09-09-2008   NORMAL CORONARY ARTERIES/ NORMAL LVSF/ CHRONIC RENAL INSUFF.  Marland Kitchen COLONOSCOPY    . CYSTO WITH HYDRODISTENSION  01/07/2012   Procedure: CYSTOSCOPY/HYDRODISTENSION;  Surgeon: Reece Packer, MD;  Location: [facility];  Service: Urology;  Laterality: N/A;   OF BLADDER WITH INSTILLATION OF MARCAINE AND PRYDIUM  . MUSCLE BIOPSY Left 10/04/2015   Procedure: LEFT Vastus Lateralis Muscle Biopsy;  Surgeon: Ashok Pall, MD;  Location: [place] [facility];  Service: Neurosurgery;  Laterality: Left;  LEFT vastus lateralis muscle biopsy  . neck cyst    . TUBAL LIGATION  YRS  AGO     OB History    Gravida  5   Para  5   Term  5   Preterm      AB      Living  5     SAB      TAB      Ectopic      Multiple      Live Births               Home Medications    Prior to Admission medications   Medication Sig Start Date End Date Taking? Authorizing Provider  albuterol (PROVENTIL HFA;VENTOLIN HFA) 108 (90 BASE) MCG/ACT inhaler Inhale 2 puffs into the lungs every 6 (six) hours as needed for wheezing or shortness of breath. Shortness of breath    [provider]  aspirin-acetaminophen-caffeine (EXCEDRIN EXTRA STRENGTH) [phone] MG per tablet Take 2 tablets by mouth every 6 (six) hours as needed for pain.    [provider]  atenolol (TENORMIN) 50 MG tablet Take 75 mg by mouth daily.    [provider]  cetirizine (ZYRTEC) 10 MG tablet Take 10 mg by mouth daily.    [provider]  Cholecalciferol (VITAMIN D) 2000 UNITS CAPS Take 2,000 Units by mouth daily.    [provider]  CYANOCOBALAMIN PO Take 1 tablet by mouth daily.    [provider]  esomeprazole (NEXIUM) 40 MG capsule Take 40 mg by mouth daily before breakfast.    [provider]  Ferrous Gluconate (IRON) 240 (27 FE) MG TABS Take 240 mg by mouth daily.     [provider]  fluticasone (FLONASE) 50 MCG/ACT nasal spray Place 2 sprays into the nose as needed for allergies. congestion    [provider]  gabapentin (NEURONTIN) 600 MG tablet Take 600 mg by mouth 3 (three) times daily.    [provider]  HYDROcodone-acetaminophen (NORCO/VICODIN) 5-325 MG tablet Take 1 tablet by mouth every 6 (six) hours as needed for moderate pain. Patient not taking: Reported on 10/14/2016 10/04/15   Ashok Pall, MD  ibuprofen (ADVIL,MOTRIN) 600 MG tablet Take 600 mg by mouth every 6 (six) hours as needed for headache.    [provider]  lidocaine (XYLOCAINE) 2 % solution Use as directed 15 mLs in the mouth  or throat as needed for mouth pain. 09/21/17   Glyn Ade, PA-C  metFORMIN (GLUCOPHAGE)  850 MG tablet Take 850 mg by mouth 2 (two) times daily with a meal.    [provider]  Multiple Vitamin (MULTIVITAMIN WITH MINERALS) TABS tablet Take 1 tablet by mouth daily.    [provider]  Omega-3 Fatty Acids (FISH OIL PO) Take 1 tablet by mouth daily.    [provider]  Oxycodone HCl 20 MG TABS Take 20 mg by mouth every 4 (four) hours. 09/12/17   [provider]  triamterene-hydrochlorothiazide (MAXZIDE-25) 37.5-25 MG per tablet Take 1 tablet by mouth daily.  01/13/14   [provider]  VOLTAREN 1 % GEL Apply 1 application topically 3 (three) times daily as needed. Pain. Apply to knees, hands, and ankles. 02/10/15   [provider]    Family History Family History  Problem Relation Age of Onset  . Cancer Mother   . Diabetes Mother   . Hypertension Mother   . Cancer Sister   . Hypertension Sister   . Hypertension Son     Social History Social History   Tobacco Use  . Smoking status: Former Smoker    Packs/day: 1.00    Years: 27.00    Pack years: 27.00    Types: Cigarettes  . Smokeless tobacco: Never Used  . Tobacco comment: quit smoking in 1993  Substance Use Topics  . Alcohol use: No    Comment: quit drinking 25+yrs ago  . Drug use: No    Comment: nothing in 25+yrs      Allergies   Avapro [irbesartan]; Lisinopril; and Latex   Review of Systems Review of Systems  Cardiovascular: Positive for chest pain.  All other systems reviewed and are negative.    Physical Exam Updated Vital Signs BP (!) 145/96 (BP Location: Right Arm)   Pulse 90   Temp 97.8 F (36.6 C) (Oral)   Resp 17   Ht 5\' 10"  (1.778 m)   Wt 112.5 kg   SpO2 100%   BMI 35.58 kg/m   Physical Exam  Constitutional: She is oriented to person, place, and time. She appears well-developed and well-nourished. No distress.  HENT:  Head: Normocephalic  and atraumatic.  Neck: Normal range of motion. Neck supple.  Cardiovascular: Normal rate and regular rhythm. Exam reveals no gallop and no friction rub.  No murmur heard. Pulmonary/Chest: Effort normal and breath sounds normal. No respiratory distress. She has no wheezes.  There is tenderness to palpation of the anterior upper chest wall.  Abdominal: Soft. Bowel sounds are normal. She exhibits no distension. There is no tenderness.  Musculoskeletal: Normal range of motion.  Neurological: She is alert and oriented to person, place, and time.  Skin: Skin is warm and dry. She is not diaphoretic.  Nursing note and vitals reviewed.    ED Treatments / Results  Labs (all labs ordered are listed, but only abnormal results are displayed) Labs Reviewed  BASIC METABOLIC PANEL - Abnormal; Notable for the following components:      Result Value   Glucose, Bld 105 (*)    All other components within normal limits  CBC - Abnormal; Notable for the following components:   Hemoglobin 11.8 (*)    All other components within normal limits  I-STAT TROPONIN, ED    EKG EKG Interpretation  Date/Time:  Thursday September 17 2018 17:14:35 EDT Ventricular Rate:  61 PR Interval:  146 QRS Duration: 82 QT Interval:  422 QTC Calculation: 424 R Axis:   41 Text Interpretation:  Normal sinus rhythm Confirmed by  Veryl Speak ([id number]) on 09/17/2018 11:50:37 PM   Radiology Dg Chest 2 View  Result Date: 09/17/2018 CLINICAL DATA:  Left-sided chest pain radiating to left arm 3 days. EXAM: CHEST - 2 VIEW COMPARISON:  03/09/2015 FINDINGS: Lungs are adequately inflated without lobar consolidation or effusion. Nodule opacity over the left lung base projects over the anterior sixth rib likely representing the left nipple. Cardiomediastinal silhouette is within normal. There are degenerative changes of the spine. Flattened hemidiaphragms on the lateral film. IMPRESSION: No acute findings. Nodular density over the left  lung base projecting over the anterior left sixth rib end. This likely represents the left nipple. Recommend follow-up PA chest radiograph with nipple markers on an elective basis. Electronically Signed   By: Marin Olp M.D.   On: 09/17/2018 18:22    Procedures Procedures (including critical care time)  Medications Ordered in ED Medications - No data to display   Initial Impression / Assessment and Plan / ED Course  I have reviewed the triage vital signs and the nursing notes.  Pertinent labs & imaging results that were available during my care of the patient were reviewed by me and considered in my medical decision making (see chart for details).  Patient presents with complaints of chest pain that I suspect is noncardiac.  Her EKG is unchanged and troponin is negative with symptoms ongoing for the past 3 days.  It is reproducible with palpation and work-up reveals no other obvious abnormality.  At this point, I feel as though she is appropriate for discharge.  To follow-up as needed.  She will be started on anti-inflammatory.  Final Clinical Impressions(s) / ED Diagnoses   Final diagnoses:  None    ED Discharge Orders    None       Veryl Speak, MD 09/18/18 0003

## 2019-08-27 ENCOUNTER — Other Ambulatory Visit: Payer: Self-pay | Admitting: Internal Medicine

## 2019-08-27 DIAGNOSIS — Z1231 Encounter for screening mammogram for malignant neoplasm of breast: Secondary | ICD-10-CM

## 2019-08-27 DIAGNOSIS — R5381 Other malaise: Secondary | ICD-10-CM

## 2019-12-01 ENCOUNTER — Other Ambulatory Visit: Payer: Self-pay | Admitting: Internal Medicine

## 2019-12-01 DIAGNOSIS — E2839 Other primary ovarian failure: Secondary | ICD-10-CM

## 2020-02-23 ENCOUNTER — Other Ambulatory Visit: Payer: Self-pay

## 2020-02-23 ENCOUNTER — Ambulatory Visit
Admission: RE | Admit: 2020-02-23 | Discharge: 2020-02-23 | Disposition: A | Discharge status: Home / Self Care | Payer: Medicare Other | Source: Ambulatory Visit | Attending: Internal Medicine | Admitting: Internal Medicine

## 2020-02-23 DIAGNOSIS — E2839 Other primary ovarian failure: Secondary | ICD-10-CM

## 2020-02-23 DIAGNOSIS — Z1231 Encounter for screening mammogram for malignant neoplasm of breast: Secondary | ICD-10-CM

## 2020-05-05 ENCOUNTER — Emergency Department (HOSPITAL_COMMUNITY)
Admission: EM | Admit: 2020-05-05 | Discharge: 2020-05-06 | Disposition: A | Discharge status: Home / Self Care | Payer: Medicare Other | Attending: Emergency Medicine | Admitting: Emergency Medicine

## 2020-05-05 ENCOUNTER — Encounter (HOSPITAL_COMMUNITY): Payer: Self-pay

## 2020-05-05 DIAGNOSIS — E119 Type 2 diabetes mellitus without complications: Secondary | ICD-10-CM | POA: Diagnosis not present

## 2020-05-05 DIAGNOSIS — M797 Fibromyalgia: Secondary | ICD-10-CM | POA: Insufficient documentation

## 2020-05-05 DIAGNOSIS — Z79899 Other long term (current) drug therapy: Secondary | ICD-10-CM | POA: Insufficient documentation

## 2020-05-05 DIAGNOSIS — J449 Chronic obstructive pulmonary disease, unspecified: Secondary | ICD-10-CM | POA: Diagnosis not present

## 2020-05-05 DIAGNOSIS — Y92481 Parking lot as the place of occurrence of the external cause: Secondary | ICD-10-CM | POA: Diagnosis not present

## 2020-05-05 DIAGNOSIS — Y999 Unspecified external cause status: Secondary | ICD-10-CM | POA: Diagnosis not present

## 2020-05-05 DIAGNOSIS — R0789 Other chest pain: Secondary | ICD-10-CM

## 2020-05-05 DIAGNOSIS — Y9389 Activity, other specified: Secondary | ICD-10-CM | POA: Insufficient documentation

## 2020-05-05 DIAGNOSIS — I10 Essential (primary) hypertension: Secondary | ICD-10-CM | POA: Insufficient documentation
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
## 2020-05-06 ENCOUNTER — Other Ambulatory Visit: Payer: Self-pay

## 2020-05-06 ENCOUNTER — Emergency Department (HOSPITAL_COMMUNITY): Payer: Medicare Other
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
## 2020-05-06 NOTE — ED Notes (Signed)
Pt transported to XR.  

## 2020-06-26 ENCOUNTER — Ambulatory Visit (INDEPENDENT_AMBULATORY_CARE_PROVIDER_SITE_OTHER): Payer: Medicare Other

## 2020-06-26 ENCOUNTER — Other Ambulatory Visit: Payer: Self-pay

## 2020-06-26 ENCOUNTER — Ambulatory Visit (HOSPITAL_COMMUNITY)
Admission: EM | Admit: 2020-06-26 | Discharge: 2020-06-26 | Disposition: A | Discharge status: Home / Self Care | Payer: Medicare Other | Attending: Family Medicine | Admitting: Family Medicine

## 2020-06-26 ENCOUNTER — Encounter (HOSPITAL_COMMUNITY): Payer: Self-pay

## 2020-06-26 DIAGNOSIS — W108XXA Fall (on) (from) other stairs and steps, initial encounter: Secondary | ICD-10-CM

## 2020-06-26 DIAGNOSIS — M79671 Pain in right foot: Secondary | ICD-10-CM

## 2020-06-26 DIAGNOSIS — S9031XA Contusion of right foot, initial encounter: Secondary | ICD-10-CM

## 2020-06-26 DIAGNOSIS — S9032XA Contusion of left foot, initial encounter: Secondary | ICD-10-CM | POA: Diagnosis not present

## 2020-06-26 DIAGNOSIS — M79672 Pain in left foot: Secondary | ICD-10-CM

## 2020-06-26 NOTE — ED Provider Notes (Signed)
[place]    CSN: [id number] Arrival date & time: 06/26/20  1437      History   Chief Complaint Chief Complaint  Patient presents with  . Foot Injury    HPI [name] is a 68 y.o. female.   HPI  Patient states she tripped going up stairs yesterday.  Both of her feet are painful.  Her right foot is painful over the fifth metatarsal laterally.  Her left foot is painful over the big toe MTP joint area.  She states that she had trouble with weightbearing on each foot secondary to pain.  She takes oxycodone 20 mg 5-6 times a day every day for chronic pain.  She states this has helped with her pain.  Past Medical History:  Diagnosis Date  . Ankle edema BILATERAL   takes Lasix daily  . Arthritis    BACK, KNEE, HIPS  . Asthma   . Cancer ([place])   . Cataracts, bilateral    immature  . Chronic back pain    d/t injury in 1995  . Chronic bronchitis   . Chronic pain syndrome   . COPD (chronic obstructive pulmonary disease) (HCC) CHRONIC BRONCHITIS --  LAST BOUT  BILATERAL PNEUMONIA 2009   uses QVAR daily and Albuterol daily as needed  . Diabetes mellitus without complication ([place])    takes Glipizide and Metformin daily  . Diverticulosis   . Fibromyalgia   . Frequency of urination   . GERD (gastroesophageal reflux disease)    takes Nexium daily   . H/O hiatal hernia   . Headache    frequently but related to sinus  . Hepatitis    hx of.Not sure if A,B,C  . History of blood transfusion    no abnormal reaction noted  . History of colon polyps    benign  . History of gastric ulcer   . History of glaucoma STATES TREATED FOR ABOUT 5 YRS  AGO  --- NO ISSUES SINCE  . Hyperlipidemia    was on meds but has been off x 2 months  . Hypertension    takes Atenlol and Triamtere  daily   . Iron deficiency anemia    takes Iron pill daily  . Joint pain   . Joint swelling   . Nocturia   . Peripheral neuropathy    takes Gabapentin daily  . Pneumonia 2010   hx of  .  PTSD (post-traumatic stress disorder)    hx of-no meds  . Seasonal allergies    takes Zyrtec daily and uses Flonase daily as needed  . Stress incontinence, female   . Urgency of urination   . Vitamin D deficiency    takes Vit D daily    Patient Active Problem List   Diagnosis Date Noted  . Chest pain 01/18/2014  . Sarcoidosis   . Positive TB test   . History of Bell's palsy   . Pelvic pain in female   . Stress incontinence, female   . Nocturia   . History of glaucoma   . Uterine fibroid   . Hyperlipidemia   . COPD (chronic obstructive pulmonary disease) ([place])   . Iron deficiency anemia   . H/O hiatal hernia   . Fibromyalgia   . Arthritis   . Chronic renal insufficiency   . Diabetes mellitus without complication [facility])     Past Surgical History:  Procedure Laterality Date  . ABDOMINAL HYSTERECTOMY     July 2013  . BILATERAL BENIGN  BREAST TUMOR REMOVED  YRS AGO  . BREAST SURGERY     2 cyst removed from right breast and tumor removed from left breast  . CARDIAC CATHETERIZATION  09-09-2008   NORMAL CORONARY ARTERIES/ NORMAL LVSF/ CHRONIC RENAL INSUFF.  Marland Kitchen COLONOSCOPY    . CYSTO WITH HYDRODISTENSION  01/07/2012   Procedure: CYSTOSCOPY/HYDRODISTENSION;  Surgeon: Reece Packer, MD;  [facility];  Service: Urology;  Laterality: N/A;   OF BLADDER WITH INSTILLATION OF MARCAINE AND PRYDIUM  . MUSCLE BIOPSY Left 10/04/2015   Procedure: LEFT Vastus Lateralis Muscle Biopsy;  Surgeon: Ashok Pall, MD;  Location: [place] [facility];  Service: Neurosurgery;  Laterality: Left;  LEFT vastus lateralis muscle biopsy  . neck cyst    . TUBAL LIGATION  YRS AGO    OB History    Gravida  5   Para  5   Term  5   Preterm      AB      Living  5     SAB      TAB      Ectopic      Multiple      Live Births               Home Medications    Prior to Admission medications   Medication Sig Start Date End Date Taking? Authorizing Provider  glucose  blood (ACCU-CHEK AVIVA PLUS) test strip  08/30/16  Yes [provider]  albuterol (PROVENTIL HFA;VENTOLIN HFA) 108 (90 BASE) MCG/ACT inhaler Inhale 2 puffs into the lungs every 6 (six) hours as needed for wheezing or shortness of breath. Shortness of breath    [provider]  albuterol (VENTOLIN HFA) 108 (90 Base) MCG/ACT inhaler Inhale into the lungs.    [provider]  aspirin-acetaminophen-caffeine (EXCEDRIN EXTRA STRENGTH) [phone] MG per tablet Take 2 tablets by mouth every 6 (six) hours as needed for pain.    [provider]  atenolol (TENORMIN) 50 MG tablet Take 75 mg by mouth daily.    [provider]  atenolol (TENORMIN) 50 MG tablet Take by mouth.    [provider]  azithromycin (ZITHROMAX) 250 MG tablet Take 250 mg by mouth as directed. 03/08/20   [provider]  beclomethasone (QVAR) 80 MCG/ACT inhaler Inhale into the lungs.    [provider]  cetirizine (ZYRTEC) 10 MG tablet Take 10 mg by mouth daily.    [provider]  cetirizine (ZYRTEC) 10 MG tablet Take by mouth.    [provider]  Cholecalciferol (VITAMIN D) 2000 UNITS CAPS Take 2,000 Units by mouth daily.    [provider]  CYANOCOBALAMIN PO Take 1 tablet by mouth daily.    [provider]  esomeprazole (NEXIUM) 40 MG capsule Take 40 mg by mouth daily before breakfast.    [provider]  esomeprazole (NEXIUM) 40 MG capsule Take by mouth.    [provider]  Ferrous Gluconate (IRON) 240 (27 FE) MG TABS Take 240 mg by mouth daily.     [provider]  fluticasone (FLONASE) 50 MCG/ACT nasal spray Place 2 sprays into the nose as needed for allergies. congestion    [provider]  fluticasone (FLONASE) 50 MCG/ACT nasal spray Place into the nose.    [provider]  gabapentin (NEURONTIN) 600 MG tablet Take 600 mg by mouth 3 (three) times daily.    [provider]    gabapentin (NEURONTIN) 600 MG tablet Take  by mouth.    [provider]  glipiZIDE (GLUCOTROL XL) 10 MG 24 hr tablet Take by mouth.    [provider]  ibuprofen (ADVIL,MOTRIN) 600 MG tablet Take 600 mg by mouth every 6 (six) hours as needed for headache.    [provider]  lidocaine (XYLOCAINE) 2 % solution Use as directed 15 mLs in the mouth or throat as needed for mouth pain. 09/21/17   Glyn Ade, PA-C  metFORMIN (GLUCOPHAGE) 850 MG tablet Take 850 mg by mouth 2 (two) times daily with a meal.    [provider]  metFORMIN (GLUCOPHAGE) 850 MG tablet Take by mouth.    [provider]  methocarbamol (ROBAXIN) 750 MG tablet Take by mouth.    [provider]  methocarbamol (ROBAXIN) 750 MG tablet Take 750 mg by mouth 3 (three) times daily. 06/13/20   [provider]  methylPREDNISolone (MEDROL DOSEPAK) 4 MG TBPK tablet  03/28/20   [provider]  Multiple Vitamin (MULTIVITAMIN WITH MINERALS) TABS tablet Take 1 tablet by mouth daily.    [provider]  Omega-3 Fatty Acids (FISH OIL PO) Take 1 tablet by mouth daily.    [provider]  oxycodone (ROXICODONE) 30 MG immediate release tablet Take by mouth.    [provider]  Oxycodone HCl 20 MG TABS Take 20 mg by mouth every 4 (four) hours. 09/12/17   [provider]  predniSONE (STERAPRED UNI-PAK 21 TAB) 5 MG (21) TBPK tablet  01/03/20   [provider]  [facility] 160-4.5 MCG/ACT inhaler  04/04/20   [provider]  triamterene-hydrochlorothiazide (MAXZIDE-25) 37.5-25 MG per tablet Take 1 tablet by mouth daily.  01/13/14   [provider]  VOLTAREN 1 % GEL Apply 1 application topically 3 (three) times daily as needed. Pain. Apply to knees, hands, and ankles. 02/10/15   [provider]    Family History Family History  Problem Relation Age of Onset  . Cancer Mother   . Diabetes Mother   . Hypertension  Mother   . Cancer Sister   . Hypertension Sister   . Hypertension Son     Social History Social History   Tobacco Use  . Smoking status: Former Smoker    Packs/day: 1.00    Years: 27.00    Pack years: 27.00    Types: Cigarettes  . Smokeless tobacco: Never Used  . Tobacco comment: quit smoking in 1993  Substance Use Topics  . Alcohol use: No    Comment: quit drinking 25+yrs ago  . Drug use: No    Comment: nothing in 25+yrs      Allergies   Avapro [irbesartan], Lisinopril, and Latex   Review of Systems Review of Systems Bilateral foot pain Gait disturbance  Physical Exam Triage Vital Signs ED Triage Vitals  Enc Vitals Group     BP 06/26/20 1605 (!) 149/88     Pulse Rate 06/26/20 1605 69     Resp 06/26/20 1605 18     Temp 06/26/20 1605 98.5 F (36.9 C)     Temp Source 06/26/20 1605 Oral     SpO2 06/26/20 1605 100 %     Weight --      Height --      Head Circumference --      Peak Flow --      Pain Score 06/26/20 1602 8     Pain Loc --      Pain Edu? --  Excl. in [place]? --    No data found.  Updated Vital Signs BP (!) 149/88 (BP Location: Right Arm)   Pulse 69   Temp 98.5 F (36.9 C) (Oral)   Resp 18   SpO2 100%      Physical Exam Constitutional:      General: She is not in acute distress.    Appearance: She is well-developed.     Comments: Overweight  HENT:     Head: Normocephalic and atraumatic.     Nose:     Comments: Mask is in place Eyes:     Conjunctiva/sclera: Conjunctivae normal.     Pupils: Pupils are equal, round, and reactive to light.  Cardiovascular:     Rate and Rhythm: Normal rate.  Pulmonary:     Effort: Pulmonary effort is normal. No respiratory distress.  Musculoskeletal:        General: Normal range of motion.     Cervical back: Normal range of motion.     Right lower leg: Edema present.     Left lower leg: Edema present.       Feet:  Feet:     Comments: No swelling or soft tissue bruising is appreciated Skin:     General: Skin is warm and dry.  Neurological:     Mental Status: She is alert.      UC Treatments / Results  Labs (all labs ordered are listed, but only abnormal results are displayed) Labs Reviewed - No data to display  EKG   Radiology DG Foot Complete Left  Result Date: 06/26/2020 CLINICAL DATA:  [place] on stairs, bilateral foot pain EXAM: LEFT FOOT - COMPLETE 3+ VIEW; RIGHT FOOT COMPLETE - 3+ VIEW COMPARISON:  None. FINDINGS: There is no evidence of fracture or dislocation. There is no evidence of arthropathy or other focal bone abnormality. Diffuse soft tissue edema about the bilateral feet and ankles. IMPRESSION: 1.  No fracture or dislocation of the bilateral feet. 2.  Diffuse soft tissue edema about the bilateral feet and ankles. Electronically Signed   By: Eddie Candle M.D.   On: 06/26/2020 17:29   DG Foot Complete Right  Result Date: 06/26/2020 CLINICAL DATA:  [place] on stairs, bilateral foot pain EXAM: LEFT FOOT - COMPLETE 3+ VIEW; RIGHT FOOT COMPLETE - 3+ VIEW COMPARISON:  None. FINDINGS: There is no evidence of fracture or dislocation. There is no evidence of arthropathy or other focal bone abnormality. Diffuse soft tissue edema about the bilateral feet and ankles. IMPRESSION: 1.  No fracture or dislocation of the bilateral feet. 2.  Diffuse soft tissue edema about the bilateral feet and ankles. Electronically Signed   By: Eddie Candle M.D.   On: 06/26/2020 17:29    Procedures Procedures (including critical care time)  Medications Ordered in UC Medications - No data to display  Initial Impression / Assessment and Plan / UC Course  I have reviewed the triage vital signs and the nursing notes.  Pertinent labs & imaging results that were available during my care of the patient were reviewed by me and considered in my medical decision making (see chart for details).     Conservative management discussed Final Clinical Impressions(s) / UC Diagnoses   Final diagnoses:    Contusion of left foot, initial encounter  Contusion of right foot, initial encounter  Fall (on) (from) other stairs and steps, initial encounter     Discharge Instructions     Elevate feet, consider ice to reduce pain and swelling  Reduce walking while feet are painful Follow-up with your usual physician    ED Prescriptions    None     PDMP not reviewed this encounter.   Raylene Everts, MD 06/26/20 1754

## 2020-06-26 NOTE — ED Triage Notes (Signed)
Pt is here with bilateral foot pain/injury after tripping going up the steps this morning. Pt has taken Oxycodone 20mg  to relieve discomfort.

## 2020-06-26 NOTE — Discharge Instructions (Addendum)
Elevate feet, consider ice to reduce pain and swelling Reduce walking while feet are painful Follow-up with your usual physician

## 2020-07-17 ENCOUNTER — Other Ambulatory Visit: Payer: Self-pay

## 2020-07-17 ENCOUNTER — Ambulatory Visit (HOSPITAL_COMMUNITY)
Admission: EM | Admit: 2020-07-17 | Discharge: 2020-07-17 | Disposition: A | Discharge status: Home / Self Care | Payer: Medicare Other | Attending: Family Medicine | Admitting: Family Medicine

## 2020-07-17 ENCOUNTER — Encounter (HOSPITAL_COMMUNITY): Payer: Self-pay | Admitting: Emergency Medicine

## 2020-07-17 DIAGNOSIS — R0789 Other chest pain: Secondary | ICD-10-CM | POA: Diagnosis not present

## 2020-07-17 MED ORDER — PREDNISONE 20 MG PO TABS: 20.0000 mg | ORAL_TABLET | Freq: Two times a day (BID) | ORAL | 0 refills | Status: DC

## 2020-07-17 MED ORDER — PREDNISONE 20 MG PO TABS
ORAL_TABLET | ORAL | Status: AC
  Filled 2020-07-17: qty 1

## 2020-07-17 MED ORDER — PREDNISONE 20 MG PO TABS
20.0000 mg | ORAL_TABLET | Freq: Once | ORAL | Status: AC
  Administered 2020-07-17: 20 mg via ORAL

## 2020-07-17 NOTE — Discharge Instructions (Addendum)
Drink plenty of fluids Take prednisone 2 times a day Continue with inhalers as needed See your primary care doctor in follow-up

## 2020-07-17 NOTE — ED Provider Notes (Signed)
[place]    CSN: [id number] Arrival date & time: 07/17/20  1841      History   Chief Complaint Chief Complaint  Patient presents with  . URI  . leg cramping    HPI [name] is a 69 y.o. female.   HPI  Patient states she is feels like she is coming down with bronchitis.  She states she has some anterior chest pain.  Worse with deep breath.  It hurts when she presses on her sternum.  She states this is what happens when she gets chest infections.  She has not had a cough.  She has not had sputum.  She has been using her inhalers.  She does have a history of asthma.  Past Medical History:  Diagnosis Date  . Ankle edema BILATERAL   takes Lasix daily  . Arthritis    BACK, KNEE, HIPS  . Asthma   . Cancer ([place])   . Cataracts, bilateral    immature  . Chronic back pain    d/t injury in 1995  . Chronic bronchitis   . Chronic pain syndrome   . COPD (chronic obstructive pulmonary disease) (HCC) CHRONIC BRONCHITIS --  LAST BOUT  BILATERAL PNEUMONIA 2009   uses QVAR daily and Albuterol daily as needed  . Diabetes mellitus without complication ([place])    takes Glipizide and Metformin daily  . Diverticulosis   . Fibromyalgia   . Frequency of urination   . GERD (gastroesophageal reflux disease)    takes Nexium daily   . H/O hiatal hernia   . Headache    frequently but related to sinus  . Hepatitis    hx of.Not sure if A,B,C  . History of blood transfusion    no abnormal reaction noted  . History of colon polyps    benign  . History of gastric ulcer   . History of glaucoma STATES TREATED FOR ABOUT 5 YRS  AGO  --- NO ISSUES SINCE  . Hyperlipidemia    was on meds but has been off x 2 months  . Hypertension    takes Atenlol and Triamtere  daily   . Iron deficiency anemia    takes Iron pill daily  . Joint pain   . Joint swelling   . Nocturia   . Peripheral neuropathy    takes Gabapentin daily  . Pneumonia 2010   hx of  . PTSD (post-traumatic stress  disorder)    hx of-no meds  . Seasonal allergies    takes Zyrtec daily and uses Flonase daily as needed  . Stress incontinence, female   . Urgency of urination   . Vitamin D deficiency    takes Vit D daily    Patient Active Problem List   Diagnosis Date Noted  . Chest pain 01/18/2014  . Sarcoidosis   . Positive TB test   . History of Bell's palsy   . Pelvic pain in female   . Stress incontinence, female   . Nocturia   . History of glaucoma   . Uterine fibroid   . Hyperlipidemia   . COPD (chronic obstructive pulmonary disease) ([place])   . Iron deficiency anemia   . H/O hiatal hernia   . Fibromyalgia   . Arthritis   . Chronic renal insufficiency   . Diabetes mellitus without complication [facility])     Past Surgical History:  Procedure Laterality Date  . ABDOMINAL HYSTERECTOMY     July 2013  . BILATERAL BENIGN  BREAST TUMOR REMOVED  YRS AGO  . BREAST SURGERY     2 cyst removed from right breast and tumor removed from left breast  . CARDIAC CATHETERIZATION  09-09-2008   NORMAL CORONARY ARTERIES/ NORMAL LVSF/ CHRONIC RENAL INSUFF.  Marland Kitchen COLONOSCOPY    . CYSTO WITH HYDRODISTENSION  01/07/2012   Procedure: CYSTOSCOPY/HYDRODISTENSION;  Surgeon: Reece Packer, MD;  [facility];  Service: Urology;  Laterality: N/A;   OF BLADDER WITH INSTILLATION OF MARCAINE AND PRYDIUM  . MUSCLE BIOPSY Left 10/04/2015   Procedure: LEFT Vastus Lateralis Muscle Biopsy;  Surgeon: Ashok Pall, MD;  Location: [place] [facility];  Service: Neurosurgery;  Laterality: Left;  LEFT vastus lateralis muscle biopsy  . neck cyst    . TUBAL LIGATION  YRS AGO    OB History    Gravida  5   Para  5   Term  5   Preterm      AB      Living  5     SAB      TAB      Ectopic      Multiple      Live Births               Home Medications    Prior to Admission medications   Medication Sig Start Date End Date Taking? Authorizing Provider  albuterol (PROVENTIL HFA;VENTOLIN  HFA) 108 (90 BASE) MCG/ACT inhaler Inhale 2 puffs into the lungs every 6 (six) hours as needed for wheezing or shortness of breath. Shortness of breath    [provider]  albuterol (VENTOLIN HFA) 108 (90 Base) MCG/ACT inhaler Inhale into the lungs.    [provider]  aspirin-acetaminophen-caffeine (EXCEDRIN EXTRA STRENGTH) [phone] MG per tablet Take 2 tablets by mouth every 6 (six) hours as needed for pain.    [provider]  atenolol (TENORMIN) 50 MG tablet Take 75 mg by mouth daily.    [provider]  atenolol (TENORMIN) 50 MG tablet Take by mouth.    [provider]  azithromycin (ZITHROMAX) 250 MG tablet Take 250 mg by mouth as directed. 03/08/20   [provider]  beclomethasone (QVAR) 80 MCG/ACT inhaler Inhale into the lungs.    [provider]  cetirizine (ZYRTEC) 10 MG tablet Take 10 mg by mouth daily.    [provider]  cetirizine (ZYRTEC) 10 MG tablet Take by mouth.    [provider]  Cholecalciferol (VITAMIN D) 2000 UNITS CAPS Take 2,000 Units by mouth daily.    [provider]  CYANOCOBALAMIN PO Take 1 tablet by mouth daily.    [provider]  esomeprazole (NEXIUM) 40 MG capsule Take 40 mg by mouth daily before breakfast.    [provider]  esomeprazole (NEXIUM) 40 MG capsule Take by mouth.    [provider]  Ferrous Gluconate (IRON) 240 (27 FE) MG TABS Take 240 mg by mouth daily.     [provider]  fluticasone (FLONASE) 50 MCG/ACT nasal spray Place 2 sprays into the nose as needed for allergies. congestion    [provider]  fluticasone (FLONASE) 50 MCG/ACT nasal spray Place into the nose.    [provider]  gabapentin (NEURONTIN) 600 MG tablet Take 600 mg by mouth 3 (three) times daily.    [provider]  gabapentin (NEURONTIN) 600 MG tablet Take by mouth.    [provider]  glipiZIDE (GLUCOTROL XL) 10 MG 24  hr  tablet Take by mouth.    [provider]  glucose blood (ACCU-CHEK AVIVA PLUS) test strip  08/30/16   [provider]  ibuprofen (ADVIL,MOTRIN) 600 MG tablet Take 600 mg by mouth every 6 (six) hours as needed for headache.    [provider]  lidocaine (XYLOCAINE) 2 % solution Use as directed 15 mLs in the mouth or throat as needed for mouth pain. 09/21/17   Glyn Ade, PA-C  metFORMIN (GLUCOPHAGE) 850 MG tablet Take 850 mg by mouth 2 (two) times daily with a meal.    [provider]  metFORMIN (GLUCOPHAGE) 850 MG tablet Take by mouth.    [provider]  methocarbamol (ROBAXIN) 750 MG tablet Take by mouth.    [provider]  methocarbamol (ROBAXIN) 750 MG tablet Take 750 mg by mouth 3 (three) times daily. 06/13/20   [provider]  methylPREDNISolone (MEDROL DOSEPAK) 4 MG TBPK tablet  03/28/20   [provider]  Multiple Vitamin (MULTIVITAMIN WITH MINERALS) TABS tablet Take 1 tablet by mouth daily.    [provider]  Omega-3 Fatty Acids (FISH OIL PO) Take 1 tablet by mouth daily.    [provider]  oxycodone (ROXICODONE) 30 MG immediate release tablet Take by mouth.    [provider]  Oxycodone HCl 20 MG TABS Take 20 mg by mouth every 4 (four) hours. 09/12/17   [provider]  predniSONE (DELTASONE) 20 MG tablet Take 1 tablet (20 mg total) by mouth 2 (two) times daily with a meal. 07/17/20   Raylene Everts, MD  [facility] 160-4.5 MCG/ACT inhaler  04/04/20   [provider]  triamterene-hydrochlorothiazide (MAXZIDE-25) 37.5-25 MG per tablet Take 1 tablet by mouth daily.  01/13/14   [provider]  VOLTAREN 1 % GEL Apply 1 application topically 3 (three) times daily as needed. Pain. Apply to knees, hands, and ankles. 02/10/15   [provider]    Family History Family History  Problem Relation Age of Onset  . Cancer Mother   . Diabetes Mother   .  Hypertension Mother   . Cancer Sister   . Hypertension Sister   . Hypertension Son     Social History Social History   Tobacco Use  . Smoking status: Former Smoker    Packs/day: 1.00    Years: 27.00    Pack years: 27.00    Types: Cigarettes  . Smokeless tobacco: Never Used  . Tobacco comment: quit smoking in 1993  Substance Use Topics  . Alcohol use: No    Comment: quit drinking 25+yrs ago  . Drug use: No    Comment: nothing in 25+yrs      Allergies   Avapro [irbesartan], Lisinopril, and Latex   Review of Systems Review of Systems See HPI  Physical Exam Triage Vital Signs ED Triage Vitals  Enc Vitals Group     BP 07/17/20 2142 (!) 165/95     Pulse Rate 07/17/20 2142 69     Resp 07/17/20 2142 20     Temp 07/17/20 2142 98.4 F (36.9 C)     Temp Source 07/17/20 2142 Oral     SpO2 07/17/20 2142 97 %     Weight 07/17/20 2144 (!) 240 lb (108.9 kg)     Height 07/17/20 2144 5\' 9"  (1.753 m)     Head Circumference --      Peak Flow --      Pain Score 07/17/20 2147 7  Pain Loc --      Pain Edu? --      Excl. in [place]? --    No data found.  Updated Vital Signs BP (!) 165/95 (BP Location: Right Arm)   Pulse 69   Temp 98.4 F (36.9 C) (Oral)   Resp 20   Ht 5\' 9"  (1.753 m)   Wt (!) 108.9 kg   SpO2 97%   BMI 35.44 kg/m   Visual Acuity Right Eye Distance:   Left Eye Distance:   Bilateral Distance:    Right Eye Near:   Left Eye Near:    Bilateral Near:     Physical Exam Constitutional:      General: She is not in acute distress.    Appearance: She is well-developed.     Comments: No distress  HENT:     Head: Normocephalic and atraumatic.  Eyes:     Conjunctiva/sclera: Conjunctivae normal.     Pupils: Pupils are equal, round, and reactive to light.  Cardiovascular:     Rate and Rhythm: Normal rate and regular rhythm.     Heart sounds: Normal heart sounds.  Pulmonary:     Effort: Pulmonary effort is normal. No respiratory distress.     Breath  sounds: Normal breath sounds.     Comments: Lungs are clear.  Mild tenderness pressure on the upper sternum level 2-3 Chest:     Chest wall: Tenderness present.  Abdominal:     Palpations: Abdomen is soft.  Musculoskeletal:        General: Normal range of motion.     Cervical back: Normal range of motion.  Skin:    General: Skin is warm and dry.  Neurological:     Mental Status: She is alert.  Psychiatric:        Mood and Affect: Mood normal.        Behavior: Behavior normal.      UC Treatments / Results  Labs (all labs ordered are listed, but only abnormal results are displayed) Labs Reviewed - No data to display  EKG   Radiology No results found.  Procedures Procedures (including critical care time)  Medications Ordered in UC Medications  predniSONE (DELTASONE) tablet 20 mg (20 mg Oral Given 07/17/20 2156)    Initial Impression / Assessment and Plan / UC Course  I have reviewed the triage vital signs and the nursing notes.  Pertinent labs & imaging results that were available during my care of the patient were reviewed by me and considered in my medical decision making (see chart for details).     *We will treat her chest wall pain with a couple days of prednisone.  She is on long-term oxycodone.  I do not think she will need additional pain management.  Follow-up as needed Final Clinical Impressions(s) / UC Diagnoses   Final diagnoses:  Chest wall pain     Discharge Instructions     Drink plenty of fluids Take prednisone 2 times a day Continue with inhalers as needed See your primary care doctor in follow-up   ED Prescriptions    Medication Sig Dispense Auth. Provider   predniSONE (DELTASONE) 20 MG tablet Take 1 tablet (20 mg total) by mouth 2 (two) times daily with a meal. 10 tablet Raylene Everts, MD     PDMP not reviewed this encounter.   Raylene Everts, MD 07/17/20 2256

## 2020-07-27 ENCOUNTER — Other Ambulatory Visit: Payer: Self-pay

## 2020-07-27 ENCOUNTER — Ambulatory Visit (HOSPITAL_COMMUNITY)
Admission: EM | Admit: 2020-07-27 | Discharge: 2020-07-27 | Disposition: A | Discharge status: Home / Self Care | Payer: Medicare Other | Attending: Family Medicine | Admitting: Family Medicine

## 2020-07-27 ENCOUNTER — Encounter (HOSPITAL_COMMUNITY): Payer: Self-pay

## 2020-07-27 DIAGNOSIS — M25552 Pain in left hip: Secondary | ICD-10-CM | POA: Diagnosis not present

## 2020-07-27 DIAGNOSIS — M791 Myalgia, unspecified site: Secondary | ICD-10-CM

## 2020-07-27 MED ORDER — DEXAMETHASONE SODIUM PHOSPHATE 10 MG/ML IJ SOLN
10.0000 mg | Freq: Once | INTRAMUSCULAR | Status: AC
  Administered 2020-07-27: 10 mg via INTRAMUSCULAR

## 2020-07-27 MED ORDER — DEXAMETHASONE SODIUM PHOSPHATE 10 MG/ML IJ SOLN
INTRAMUSCULAR | Status: AC
  Filled 2020-07-27: qty 1

## 2020-07-27 NOTE — ED Provider Notes (Signed)
[place]   [id number] 07/27/20 Arrival Time: 1447  KW:IOXBD PAIN  SUBJECTIVE: History from: patient. [name] is a 69 y.o. female complains of left hip pain that began yesterday. Has hx chronic pain, diabetes, sarcoidosis. Takes Oxycodone 20mg  5-6 times per day. Reports that she work at [employer] and has been lifting boxes more than usual. Reports muscle spasms to the L hip as well. Describes the pain as constant and achy in character.  Has tried OTC medications without relief. Symptoms are made worse with activity. Denies similar symptoms in the past.  Denies fever, chills, erythema, ecchymosis, effusion, weakness, numbness and tingling, saddle paresthesias, loss of bowel or bladder function.      ROS: As per HPI.  All other pertinent ROS negative.     Past Medical History:  Diagnosis Date  . Ankle edema BILATERAL   takes Lasix daily  . Arthritis    BACK, KNEE, HIPS  . Asthma   . Cancer ([place])   . Cataracts, bilateral    immature  . Chronic back pain    d/t injury in 1995  . Chronic bronchitis   . Chronic pain syndrome   . COPD (chronic obstructive pulmonary disease) (HCC) CHRONIC BRONCHITIS --  LAST BOUT  BILATERAL PNEUMONIA 2009   uses QVAR daily and Albuterol daily as needed  . Diabetes mellitus without complication ([place])    takes Glipizide and Metformin daily  . Diverticulosis   . Fibromyalgia   . Frequency of urination   . GERD (gastroesophageal reflux disease)    takes Nexium daily   . H/O hiatal hernia   . Headache    frequently but related to sinus  . Hepatitis    hx of.Not sure if A,B,C  . History of blood transfusion    no abnormal reaction noted  . History of colon polyps    benign  . History of gastric ulcer   . History of glaucoma STATES TREATED FOR ABOUT 5 YRS  AGO  --- NO ISSUES SINCE  . Hyperlipidemia    was on meds but has been off x 2 months  . Hypertension    takes Atenlol and Triamtere  daily   . Iron deficiency anemia    takes  Iron pill daily  . Joint pain   . Joint swelling   . Nocturia   . Peripheral neuropathy    takes Gabapentin daily  . Pneumonia 2010   hx of  . PTSD (post-traumatic stress disorder)    hx of-no meds  . Seasonal allergies    takes Zyrtec daily and uses Flonase daily as needed  . Stress incontinence, female   . Urgency of urination   . Vitamin D deficiency    takes Vit D daily   Past Surgical History:  Procedure Laterality Date  . ABDOMINAL HYSTERECTOMY     July 2013  . BILATERAL BENIGN BREAST TUMOR REMOVED  YRS AGO  . BREAST SURGERY     2 cyst removed from right breast and tumor removed from left breast  . CARDIAC CATHETERIZATION  09-09-2008   NORMAL CORONARY ARTERIES/ NORMAL LVSF/ CHRONIC RENAL INSUFF.  Marland Kitchen COLONOSCOPY    . CYSTO WITH HYDRODISTENSION  01/07/2012   Procedure: CYSTOSCOPY/HYDRODISTENSION;  Surgeon: Reece Packer, MD;  Location: [facility];  Service: Urology;  Laterality: N/A;   OF BLADDER WITH INSTILLATION OF MARCAINE AND PRYDIUM  . MUSCLE BIOPSY Left 10/04/2015   Procedure: LEFT Vastus Lateralis Muscle Biopsy;  Surgeon: Ashok Pall,  MD;  Location: [facility];  Service: Neurosurgery;  Laterality: Left;  LEFT vastus lateralis muscle biopsy  . neck cyst    . TUBAL LIGATION  YRS AGO   Allergies  Allergen Reactions  . Avapro [Irbesartan] Other (See Comments)    ANGIOEDEMA  . Lisinopril Other (See Comments)    ANGIOEDEMA  . Latex Swelling and Rash   No current facility-administered medications on file prior to encounter.   Current Outpatient Medications on File Prior to Encounter  Medication Sig Dispense Refill  . albuterol (PROVENTIL HFA;VENTOLIN HFA) 108 (90 BASE) MCG/ACT inhaler Inhale 2 puffs into the lungs every 6 (six) hours as needed for wheezing or shortness of breath. Shortness of breath    . albuterol (VENTOLIN HFA) 108 (90 Base) MCG/ACT inhaler Inhale into the lungs.    Marland Kitchen aspirin-acetaminophen-caffeine (EXCEDRIN EXTRA STRENGTH)  250-250-65 MG per tablet Take 2 tablets by mouth every 6 (six) hours as needed for pain.    Marland Kitchen atenolol (TENORMIN) 50 MG tablet Take 75 mg by mouth daily.    Marland Kitchen atenolol (TENORMIN) 50 MG tablet Take by mouth.    Marland Kitchen azithromycin (ZITHROMAX) 250 MG tablet Take 250 mg by mouth as directed.    . beclomethasone (QVAR) 80 MCG/ACT inhaler Inhale into the lungs.    . cetirizine (ZYRTEC) 10 MG tablet Take 10 mg by mouth daily.    . cetirizine (ZYRTEC) 10 MG tablet Take by mouth.    . Cholecalciferol (VITAMIN D) 2000 UNITS CAPS Take 2,000 Units by mouth daily.    . CYANOCOBALAMIN PO Take 1 tablet by mouth daily.    Marland Kitchen esomeprazole (NEXIUM) 40 MG capsule Take 40 mg by mouth daily before breakfast.    . esomeprazole (NEXIUM) 40 MG capsule Take by mouth.    . Ferrous Gluconate (IRON) 240 (27 FE) MG TABS Take 240 mg by mouth daily.     . fluticasone (FLONASE) 50 MCG/ACT nasal spray Place 2 sprays into the nose as needed for allergies. congestion    . fluticasone (FLONASE) 50 MCG/ACT nasal spray Place into the nose.    . gabapentin (NEURONTIN) 600 MG tablet Take 600 mg by mouth 3 (three) times daily.    Marland Kitchen gabapentin (NEURONTIN) 600 MG tablet Take by mouth.    Marland Kitchen glipiZIDE (GLUCOTROL XL) 10 MG 24 hr tablet Take by mouth.    Marland Kitchen glucose blood (ACCU-CHEK AVIVA PLUS) test strip     . ibuprofen (ADVIL,MOTRIN) 600 MG tablet Take 600 mg by mouth every 6 (six) hours as needed for headache.    . lidocaine (XYLOCAINE) 2 % solution Use as directed 15 mLs in the mouth or throat as needed for mouth pain. 200 mL 0  . metFORMIN (GLUCOPHAGE) 850 MG tablet Take 850 mg by mouth 2 (two) times daily with a meal.    . metFORMIN (GLUCOPHAGE) 850 MG tablet Take by mouth.    . methocarbamol (ROBAXIN) 750 MG tablet Take by mouth.    . methocarbamol (ROBAXIN) 750 MG tablet Take 750 mg by mouth 3 (three) times daily.    . methylPREDNISolone (MEDROL DOSEPAK) 4 MG TBPK tablet     . Multiple Vitamin (MULTIVITAMIN WITH MINERALS) TABS tablet  Take 1 tablet by mouth daily.    . Omega-3 Fatty Acids (FISH OIL PO) Take 1 tablet by mouth daily.    Marland Kitchen oxycodone (ROXICODONE) 30 MG immediate release tablet Take by mouth.    . Oxycodone HCl 20 MG TABS Take 20 mg by mouth every  4 (four) hours.  0  . predniSONE (DELTASONE) 20 MG tablet Take 1 tablet (20 mg total) by mouth 2 (two) times daily with a meal. 10 tablet 0  . SYMBICORT 160-4.5 MCG/ACT inhaler     . triamterene-hydrochlorothiazide (MAXZIDE-25) 37.5-25 MG per tablet Take 1 tablet by mouth daily.     . VOLTAREN 1 % GEL Apply 1 application topically 3 (three) times daily as needed. Pain. Apply to knees, hands, and ankles.  0   Social History   Socioeconomic History  . Marital status: Married    Spouse name: Christia Reading  . Number of children: 5  . Years of education: 13-14  . Highest education level: Not on file  Occupational History    Comment: CME, lab tech  Tobacco Use  . Smoking status: Former Smoker    Packs/day: 1.00    Years: 27.00    Pack years: 27.00    Types: Cigarettes  . Smokeless tobacco: Never Used  . Tobacco comment: quit smoking in 1993  Substance and Sexual Activity  . Alcohol use: No    Comment: quit drinking 25+yrs ago  . Drug use: No    Comment: nothing in 25+yrs   . Sexual activity: Not Currently    Birth control/protection: Surgical, None  Other Topics Concern  . Not on file  Social History Narrative   Lives with 2 grandchildren 1 stepson, husband   caffeine use - coffee, tea 1-2 c daily   Social Determinants of Health   Financial Resource Strain:   . Difficulty of Paying Living Expenses:   Food Insecurity:   . Worried About Charity fundraiser in the Last Year:   . Arboriculturist in the Last Year:   Transportation Needs:   . Film/video editor (Medical):   Marland Kitchen Lack of Transportation (Non-Medical):   Physical Activity:   . Days of Exercise per Week:   . Minutes of Exercise per Session:   Stress:   . Feeling of Stress :   Social  Connections:   . Frequency of Communication with Friends and Family:   . Frequency of Social Gatherings with Friends and Family:   . Attends Religious Services:   . Active Member of Clubs or Organizations:   . Attends Archivist Meetings:   Marland Kitchen Marital Status:   Intimate Partner Violence:   . Fear of Current or Ex-Partner:   . Emotionally Abused:   Marland Kitchen Physically Abused:   . Sexually Abused:    Family History  Problem Relation Age of Onset  . Cancer Mother   . Diabetes Mother   . Hypertension Mother   . Cancer Sister   . Hypertension Sister   . Hypertension Son     OBJECTIVE:  Vitals:   07/27/20 1519 07/27/20 1521  BP:  (!) 146/95  Pulse: 88   Resp: 16   Temp: 98 F (36.7 C)   SpO2: 100%     General appearance: ALERT; in no acute distress, in wheelchair  Head: NCAT Lungs: Normal respiratory effort CV:  pulses 2+ bilaterally. Cap refill < 2 seconds Musculoskeletal:  Inspection: Skin warm, dry, clear and intact without obvious erythema, effusion, or ecchymosis.  Palpation: L hip tender to palpation ROM: limited ROM active and passive to L leg Skin: warm and dry Neurologic: Ambulates without difficulty; Sensation intact about the upper/ lower extremities Psychological: alert and cooperative; normal mood and affect  DIAGNOSTIC STUDIES:  No results found.   ASSESSMENT & PLAN:  1. Left hip pain   2. Muscle pain     Meds ordered this encounter  Medications  . dexamethasone (DECADRON) injection 10 mg   Decadron 10mg  IM in office today Likely bursitis Continue conservative management of rest, ice, and gentle stretches Take ibuprofen as needed for pain relief (may cause abdominal discomfort, ulcers, and GI bleeds avoid taking with other NSAIDs) Continue home pain medication and muscle relaxer Work note provided Follow up with PCP if symptoms persist Return or go to the ER if you have any new or worsening symptoms (fever, chills, chest pain, abdominal  pain, changes in bowel or bladder habits, pain radiating into lower legs)   Reviewed expectations re: course of current medical issues. Questions answered. Outlined signs and symptoms indicating need for more acute intervention. Patient verbalized understanding. After Visit Summary given.       Faustino Congress, NP 07/27/20 1551

## 2020-07-27 NOTE — ED Triage Notes (Signed)
Pt c/o left hip pain and now "muscle spasms all over my body" since 8AM today while working

## 2020-07-27 NOTE — Discharge Instructions (Signed)
Steroid injection given in the office for inflammation  Continue with home medication regimen  If not improving, follow up with primary care  If you are experiencing unrelenting pain, trouble swallowing, trouble breathing, follow up with the ER

## 2020-08-01 ENCOUNTER — Other Ambulatory Visit: Payer: Self-pay

## 2020-08-01 ENCOUNTER — Encounter (HOSPITAL_COMMUNITY): Payer: Self-pay | Admitting: Emergency Medicine

## 2020-08-01 ENCOUNTER — Ambulatory Visit (HOSPITAL_COMMUNITY)
Admission: EM | Admit: 2020-08-01 | Discharge: 2020-08-01 | Disposition: A | Discharge status: Home / Self Care | Payer: Medicare Other | Attending: Family Medicine | Admitting: Family Medicine

## 2020-08-01 DIAGNOSIS — N3946 Mixed incontinence: Secondary | ICD-10-CM | POA: Diagnosis not present

## 2020-08-01 DIAGNOSIS — M545 Low back pain, unspecified: Secondary | ICD-10-CM

## 2020-08-01 LAB — POCT URINALYSIS DIPSTICK, ED / UC
Bilirubin Urine: NEGATIVE
Glucose, UA: NEGATIVE mg/dL
Ketones, ur: NEGATIVE mg/dL
Nitrite: NEGATIVE
Protein, ur: NEGATIVE mg/dL
Specific Gravity, Urine: >=1.03 (ref 1.005–1.030)
Urobilinogen, UA: 0.2 mg/dL (ref 0.0–1.0)
pH: 5.5 (ref 5.0–8.0)

## 2020-08-01 NOTE — ED Provider Notes (Signed)
[place]    CSN: [id number] Arrival date & time: 08/01/20  1804      History   Chief Complaint Chief Complaint  Patient presents with  . Back Pain    HPI [name] is a 69 y.o. female.   She is presenting with urinary complaints as well as ongoing low back and leg pain.  She feels improved in terms of the functionality of her low back pain.  She is on chronic narcotics, muscle relaxer, gabapentin and anti-inflammatory.  She works at [employer] and her symptoms are exacerbated by repeated lifting.  Her urinary complaints that she feels like a heaviness in her pelvis.  She has a history of hysterectomy.  Tends to urinate on a frequent basis and has incontinence from time to time.  Denies any fevers or chills.  HPI  Past Medical History:  Diagnosis Date  . Ankle edema BILATERAL   takes Lasix daily  . Arthritis    BACK, KNEE, HIPS  . Asthma   . Cancer ([place])   . Cataracts, bilateral    immature  . Chronic back pain    d/t injury in 1995  . Chronic bronchitis   . Chronic pain syndrome   . COPD (chronic obstructive pulmonary disease) (HCC) CHRONIC BRONCHITIS --  LAST BOUT  BILATERAL PNEUMONIA 2009   uses QVAR daily and Albuterol daily as needed  . Diabetes mellitus without complication ([place])    takes Glipizide and Metformin daily  . Diverticulosis   . Fibromyalgia   . Frequency of urination   . GERD (gastroesophageal reflux disease)    takes Nexium daily   . H/O hiatal hernia   . Headache    frequently but related to sinus  . Hepatitis    hx of.Not sure if A,B,C  . History of blood transfusion    no abnormal reaction noted  . History of colon polyps    benign  . History of gastric ulcer   . History of glaucoma STATES TREATED FOR ABOUT 5 YRS  AGO  --- NO ISSUES SINCE  . Hyperlipidemia    was on meds but has been off x 2 months  . Hypertension    takes Atenlol and Triamtere  daily   . Iron deficiency anemia    takes Iron pill daily  . Joint pain   .  Joint swelling   . Nocturia   . Peripheral neuropathy    takes Gabapentin daily  . Pneumonia 2010   hx of  . PTSD (post-traumatic stress disorder)    hx of-no meds  . Seasonal allergies    takes Zyrtec daily and uses Flonase daily as needed  . Stress incontinence, female   . Urgency of urination   . Vitamin D deficiency    takes Vit D daily    Patient Active Problem List   Diagnosis Date Noted  . Chest pain 01/18/2014  . Sarcoidosis   . Positive TB test   . History of Bell's palsy   . Pelvic pain in female   . Stress incontinence, female   . Nocturia   . History of glaucoma   . Uterine fibroid   . Hyperlipidemia   . COPD (chronic obstructive pulmonary disease) ([place])   . Iron deficiency anemia   . H/O hiatal hernia   . Fibromyalgia   . Arthritis   . Chronic renal insufficiency   . Diabetes mellitus without complication [facility])     Past Surgical History:  Procedure  Laterality Date  . ABDOMINAL HYSTERECTOMY     July 2013  . BILATERAL BENIGN BREAST TUMOR REMOVED  YRS AGO  . BREAST SURGERY     2 cyst removed from right breast and tumor removed from left breast  . CARDIAC CATHETERIZATION  09-09-2008   NORMAL CORONARY ARTERIES/ NORMAL LVSF/ CHRONIC RENAL INSUFF.  Marland Kitchen COLONOSCOPY    . CYSTO WITH HYDRODISTENSION  01/07/2012   Procedure: CYSTOSCOPY/HYDRODISTENSION;  Surgeon: Reece Packer, MD;  [facility];  Service: Urology;  Laterality: N/A;   OF BLADDER WITH INSTILLATION OF MARCAINE AND PRYDIUM  . MUSCLE BIOPSY Left 10/04/2015   Procedure: LEFT Vastus Lateralis Muscle Biopsy;  Surgeon: Ashok Pall, MD;  Location: [place] [facility];  Service: Neurosurgery;  Laterality: Left;  LEFT vastus lateralis muscle biopsy  . neck cyst    . TUBAL LIGATION  YRS AGO    OB History    Gravida  5   Para  5   Term  5   Preterm      AB      Living  5     SAB      TAB      Ectopic      Multiple      Live Births               Home  Medications    Prior to Admission medications   Medication Sig Start Date End Date Taking? Authorizing Provider  albuterol (PROVENTIL HFA;VENTOLIN HFA) 108 (90 BASE) MCG/ACT inhaler Inhale 2 puffs into the lungs every 6 (six) hours as needed for wheezing or shortness of breath. Shortness of breath    [provider]  albuterol (VENTOLIN HFA) 108 (90 Base) MCG/ACT inhaler Inhale into the lungs.    [provider]  aspirin-acetaminophen-caffeine (EXCEDRIN EXTRA STRENGTH) [phone] MG per tablet Take 2 tablets by mouth every 6 (six) hours as needed for pain.    [provider]  atenolol (TENORMIN) 50 MG tablet Take 75 mg by mouth daily.    [provider]  atenolol (TENORMIN) 50 MG tablet Take by mouth.    [provider]  beclomethasone (QVAR) 80 MCG/ACT inhaler Inhale into the lungs.    [provider]  cetirizine (ZYRTEC) 10 MG tablet Take 10 mg by mouth daily.    [provider]  cetirizine (ZYRTEC) 10 MG tablet Take by mouth.    [provider]  Cholecalciferol (VITAMIN D) 2000 UNITS CAPS Take 2,000 Units by mouth daily.    [provider]  CYANOCOBALAMIN PO Take 1 tablet by mouth daily.    [provider]  esomeprazole (NEXIUM) 40 MG capsule Take 40 mg by mouth daily before breakfast.    [provider]  esomeprazole (NEXIUM) 40 MG capsule Take by mouth.    [provider]  Ferrous Gluconate (IRON) 240 (27 FE) MG TABS Take 240 mg by mouth daily.     [provider]  fluticasone (FLONASE) 50 MCG/ACT nasal spray Place 2 sprays into the nose as needed for allergies. congestion    [provider]  fluticasone (FLONASE) 50 MCG/ACT nasal spray Place into the nose.    [provider]  gabapentin (NEURONTIN) 600 MG tablet Take 600 mg by mouth 3 (three) times daily.    [provider]  gabapentin (NEURONTIN) 600 MG tablet Take by mouth.    [provider]  glipiZIDE (GLUCOTROL XL) 10 MG 24 hr tablet Take by  mouth.    [provider]  glucose blood (ACCU-CHEK AVIVA PLUS) test strip  08/30/16   [provider]  ibuprofen (ADVIL,MOTRIN) 600 MG tablet Take 600 mg by mouth every 6 (six) hours as needed for headache.    [provider]  lidocaine (XYLOCAINE) 2 % solution Use as directed 15 mLs in the mouth or throat as needed for mouth pain. 09/21/17   Glyn Ade, PA-C  metFORMIN (GLUCOPHAGE) 850 MG tablet Take 850 mg by mouth 2 (two) times daily with a meal.    [provider]  metFORMIN (GLUCOPHAGE) 850 MG tablet Take by mouth.    [provider]  methocarbamol (ROBAXIN) 750 MG tablet Take by mouth.    [provider]  methocarbamol (ROBAXIN) 750 MG tablet Take 750 mg by mouth 3 (three) times daily. 06/13/20   [provider]  Multiple Vitamin (MULTIVITAMIN WITH MINERALS) TABS tablet Take 1 tablet by mouth daily.    [provider]  Omega-3 Fatty Acids (FISH OIL PO) Take 1 tablet by mouth daily.    [provider]  oxycodone (ROXICODONE) 30 MG immediate release tablet Take by mouth.    [provider]  Oxycodone HCl 20 MG TABS Take 20 mg by mouth every 4 (four) hours. 09/12/17   [provider]  SYMBICORT 160-4.5 MCG/ACT inhaler  04/04/20   [provider]  triamterene-hydrochlorothiazide (MAXZIDE-25) 37.5-25 MG per tablet Take 1 tablet by mouth daily.  01/13/14   [provider]  VOLTAREN 1 % GEL Apply 1 application topically 3 (three) times daily as needed. Pain. Apply to knees, hands, and ankles. 02/10/15   [provider]    Family History Family History  Problem Relation Age of Onset  . Cancer Mother   . Diabetes Mother   . Hypertension Mother   . Cancer Sister   . Hypertension Sister   . Hypertension Son     Social History Social History   Tobacco Use  . Smoking status: Former Smoker     Packs/day: 1.00    Years: 27.00    Pack years: 27.00    Types: Cigarettes  . Smokeless tobacco: Never Used  . Tobacco comment: quit smoking in 1993  Substance Use Topics  . Alcohol use: No    Comment: quit drinking 25+yrs ago  . Drug use: No    Comment: nothing in 25+yrs      Allergies   Avapro [irbesartan], Lisinopril, and Latex   Review of Systems Review of Systems  See HPI  Physical Exam Triage Vital Signs ED Triage Vitals  Enc Vitals Group     BP 08/01/20 1906 131/85     Pulse Rate 08/01/20 1906 77     Resp 08/01/20 1906 16     Temp 08/01/20 1906 99.2 F (37.3 C)     Temp Source 08/01/20 1906 Oral     SpO2 08/01/20 1906 100 %     Weight --      Height --      Head Circumference --      Peak Flow --      Pain Score 08/01/20 1902 8     Pain Loc --      Pain Edu? --      Excl. in [place]? --    No data found.  Updated Vital Signs BP 131/85 (BP Location: Right Arm)   Pulse 77   Temp 99.2 F (37.3 C) (Oral)   Resp 16  SpO2 100%   Visual Acuity Right Eye Distance:   Left Eye Distance:   Bilateral Distance:    Right Eye Near:   Left Eye Near:    Bilateral Near:     Physical Exam Gen: NAD, alert, cooperative with exam, well-appearing ENT: normal lips, normal nasal mucosa,  Eye: normal EOM, normal conjunctiva and lids Resp: no accessory muscle use, non-labored,  Skin: no rashes, no areas of induration  Neuro: normal tone, normal sensation to touch Psych:  normal insight, alert and oriented MSK:  Back: Normal range of motion. Normal strength to resistance with hip flexion. Normal gait. Negative straight leg raise. Neurovascular intact   UC Treatments / Results  Labs (all labs ordered are listed, but only abnormal results are displayed) Labs Reviewed  POCT URINALYSIS DIPSTICK, ED / UC - Abnormal; Notable for the following components:      Result Value   Hgb urine dipstick TRACE (*)    Leukocytes,Ua SMALL (*)    All other components within  normal limits  URINE CULTURE    EKG   Radiology No results found.  Procedures Procedures (including critical care time)  Medications Ordered in UC Medications - No data to display  Initial Impression / Assessment and Plan / UC Course  I have reviewed the triage vital signs and the nursing notes.  Pertinent labs & imaging results that were available during my care of the patient were reviewed by me and considered in my medical decision making (see chart for details).     Ms. Crowell is a 69 year old female is presenting with urinary complaints and incontinence as well as low back pain.  Urinalysis showing small leukocytes in the urine culture was obtained.  She has been seen by chronic pain and established with ongoing medications.  Low back pain is likely related to weakness and pelvic floor instability.  Counseled on home rehab.  Given indications to follow-up.  Final Clinical Impressions(s) / UC Diagnoses   Final diagnoses:  Acute bilateral low back pain without sciatica  Mixed stress and urge urinary incontinence     Discharge Instructions     Please try heat  Please try the exercises  Please follow up if your symptoms fail to improve.     ED Prescriptions    None     PDMP not reviewed this encounter.   Rosemarie Ax, MD 08/01/20 2027

## 2020-08-01 NOTE — Discharge Instructions (Signed)
Please try heat  Please try the exercises  Please follow up if your symptoms fail to improve.  

## 2020-08-01 NOTE — ED Triage Notes (Signed)
Patient presents to [facility] for assessment of continued buttocks, back pain, pelvic pain since her visit Friday.  Patient states she was supposed to go back to work and she does not feel she is ready to return to work.

## 2020-08-02 LAB — URINE CULTURE: Culture: <10000 — AB

## 2020-08-16 ENCOUNTER — Ambulatory Visit (HOSPITAL_COMMUNITY)
Admission: EM | Admit: 2020-08-16 | Discharge: 2020-08-16 | Disposition: A | Discharge status: Home / Self Care | Payer: Medicare Other | Attending: Urgent Care | Admitting: Urgent Care

## 2020-08-16 ENCOUNTER — Other Ambulatory Visit: Payer: Self-pay

## 2020-08-16 ENCOUNTER — Encounter (HOSPITAL_COMMUNITY): Payer: Self-pay

## 2020-08-16 DIAGNOSIS — R600 Localized edema: Secondary | ICD-10-CM

## 2020-08-16 DIAGNOSIS — H00024 Hordeolum internum left upper eyelid: Secondary | ICD-10-CM

## 2020-08-16 DIAGNOSIS — R609 Edema, unspecified: Secondary | ICD-10-CM | POA: Diagnosis not present

## 2020-08-16 DIAGNOSIS — R030 Elevated blood-pressure reading, without diagnosis of hypertension: Secondary | ICD-10-CM

## 2020-08-16 DIAGNOSIS — I10 Essential (primary) hypertension: Secondary | ICD-10-CM | POA: Diagnosis present

## 2020-08-16 LAB — BASIC METABOLIC PANEL
Anion gap: 11 (ref 5–15)
BUN: 16 mg/dL (ref 8–23)
CO2: 30 mmol/L (ref 22–32)
Calcium: 9.6 mg/dL (ref 8.9–10.3)
Chloride: 99 mmol/L (ref 98–111)
Creatinine, Ser: 0.69 mg/dL (ref 0.44–1.00)
GFR calc Af Amer: >60 mL/min (ref >60)
GFR calc non Af Amer: >60 mL/min (ref >60)
Glucose, Bld: 137 mg/dL — ABNORMAL HIGH (ref 70–99)
Potassium: 3.8 mmol/L (ref 3.5–5.1)
Sodium: 140 mmol/L (ref 135–145)

## 2020-08-16 MED ORDER — FUROSEMIDE 40 MG PO TABS: 40.0000 mg | ORAL_TABLET | Freq: Every day | ORAL | 0 refills | Status: DC

## 2020-08-16 MED ORDER — POLYMYXIN B-TRIMETHOPRIM 10000-0.1 UNIT/ML-% OP SOLN: 1.0000 [drp] | OPHTHALMIC | 0 refills | Status: DC

## 2020-08-16 MED ORDER — POTASSIUM CHLORIDE ER 10 MEQ PO TBCR: 10.0000 meq | EXTENDED_RELEASE_TABLET | Freq: Every day | ORAL | 0 refills | Status: DC

## 2020-08-16 NOTE — ED Notes (Signed)
Patient able to ambulate independently  

## 2020-08-16 NOTE — ED Provider Notes (Signed)
[name]   MRN: [id number] DOB: 1951-07-19  Subjective:   [name] is a 69 y.o. female presenting for 2 day hx of acute onset lower leg swelling. Also has a left eye stye, has improved today but still feels the swelling and pain. Has a hx of COPD, denies heart hx. Has a hx of renal insufficiency. Denies h/a, cp, shob, dyspnea. Has diabetes well controlled. Has HTN, uses HCTZ.   No current facility-administered medications for this encounter.  Current Outpatient Medications:  .  albuterol (PROVENTIL HFA;VENTOLIN HFA) 108 (90 BASE) MCG/ACT inhaler, Inhale 2 puffs into the lungs every 6 (six) hours as needed for wheezing or shortness of breath. Shortness of breath, Disp: , Rfl:  .  albuterol (VENTOLIN HFA) 108 (90 Base) MCG/ACT inhaler, Inhale into the lungs., Disp: , Rfl:  .  aspirin-acetaminophen-caffeine (EXCEDRIN EXTRA STRENGTH) 250-250-65 MG per tablet, Take 2 tablets by mouth every 6 (six) hours as needed for pain., Disp: , Rfl:  .  atenolol (TENORMIN) 50 MG tablet, Take 75 mg by mouth daily., Disp: , Rfl:  .  atenolol (TENORMIN) 50 MG tablet, Take by mouth., Disp: , Rfl:  .  beclomethasone (QVAR) 80 MCG/ACT inhaler, Inhale into the lungs., Disp: , Rfl:  .  cetirizine (ZYRTEC) 10 MG tablet, Take 10 mg by mouth daily., Disp: , Rfl:  .  cetirizine (ZYRTEC) 10 MG tablet, Take by mouth., Disp: , Rfl:  .  Cholecalciferol (VITAMIN D) 2000 UNITS CAPS, Take 2,000 Units by mouth daily., Disp: , Rfl:  .  CYANOCOBALAMIN PO, Take 1 tablet by mouth daily., Disp: , Rfl:  .  esomeprazole (NEXIUM) 40 MG capsule, Take 40 mg by mouth daily before breakfast., Disp: , Rfl:  .  esomeprazole (NEXIUM) 40 MG capsule, Take by mouth., Disp: , Rfl:  .  Ferrous Gluconate (IRON) 240 (27 FE) MG TABS, Take 240 mg by mouth daily. , Disp: , Rfl:  .  fluticasone (FLONASE) 50 MCG/ACT nasal spray, Place 2 sprays into the nose as needed for allergies. congestion, Disp: , Rfl:  .  fluticasone (FLONASE) 50  MCG/ACT nasal spray, Place into the nose., Disp: , Rfl:  .  gabapentin (NEURONTIN) 600 MG tablet, Take 600 mg by mouth 3 (three) times daily., Disp: , Rfl:  .  gabapentin (NEURONTIN) 600 MG tablet, Take by mouth., Disp: , Rfl:  .  glipiZIDE (GLUCOTROL XL) 10 MG 24 hr tablet, Take by mouth., Disp: , Rfl:  .  glucose blood (ACCU-CHEK AVIVA PLUS) test strip, , Disp: , Rfl:  .  ibuprofen (ADVIL,MOTRIN) 600 MG tablet, Take 600 mg by mouth every 6 (six) hours as needed for headache., Disp: , Rfl:  .  lidocaine (XYLOCAINE) 2 % solution, Use as directed 15 mLs in the mouth or throat as needed for mouth pain., Disp: 200 mL, Rfl: 0 .  metFORMIN (GLUCOPHAGE) 850 MG tablet, Take 850 mg by mouth 2 (two) times daily with a meal., Disp: , Rfl:  .  metFORMIN (GLUCOPHAGE) 850 MG tablet, Take by mouth., Disp: , Rfl:  .  methocarbamol (ROBAXIN) 750 MG tablet, Take by mouth., Disp: , Rfl:  .  methocarbamol (ROBAXIN) 750 MG tablet, Take 750 mg by mouth 3 (three) times daily., Disp: , Rfl:  .  Multiple Vitamin (MULTIVITAMIN WITH MINERALS) TABS tablet, Take 1 tablet by mouth daily., Disp: , Rfl:  .  Omega-3 Fatty Acids (FISH OIL PO), Take 1 tablet by mouth daily., Disp: , Rfl:  .  oxycodone (  ROXICODONE) 30 MG immediate release tablet, Take by mouth., Disp: , Rfl:  .  Oxycodone HCl 20 MG TABS, Take 20 mg by mouth every 4 (four) hours., Disp: , Rfl: 0 .  SYMBICORT 160-4.5 MCG/ACT inhaler, , Disp: , Rfl:  .  triamterene-hydrochlorothiazide (MAXZIDE-25) 37.5-25 MG per tablet, Take 1 tablet by mouth daily. , Disp: , Rfl:  .  VOLTAREN 1 % GEL, Apply 1 application topically 3 (three) times daily as needed. Pain. Apply to knees, hands, and ankles., Disp: , Rfl: 0   Allergies  Allergen Reactions  . Avapro [Irbesartan] Other (See Comments)    ANGIOEDEMA  . Lisinopril Other (See Comments)    ANGIOEDEMA  . Latex Swelling and Rash    Past Medical History:  Diagnosis Date  . Ankle edema BILATERAL   takes Lasix daily  .  Arthritis    BACK, KNEE, HIPS  . Asthma   . Cancer ([place])   . Cataracts, bilateral    immature  . Chronic back pain    d/t injury in 1995  . Chronic bronchitis   . Chronic pain syndrome   . COPD (chronic obstructive pulmonary disease) (HCC) CHRONIC BRONCHITIS --  LAST BOUT  BILATERAL PNEUMONIA 2009   uses QVAR daily and Albuterol daily as needed  . Diabetes mellitus without complication ([place])    takes Glipizide and Metformin daily  . Diverticulosis   . Fibromyalgia   . Frequency of urination   . GERD (gastroesophageal reflux disease)    takes Nexium daily   . H/O hiatal hernia   . Headache    frequently but related to sinus  . Hepatitis    hx of.Not sure if A,B,C  . History of blood transfusion    no abnormal reaction noted  . History of colon polyps    benign  . History of gastric ulcer   . History of glaucoma STATES TREATED FOR ABOUT 5 YRS  AGO  --- NO ISSUES SINCE  . Hyperlipidemia    was on meds but has been off x 2 months  . Hypertension    takes Atenlol and Triamtere  daily   . Iron deficiency anemia    takes Iron pill daily  . Joint pain   . Joint swelling   . Nocturia   . Peripheral neuropathy    takes Gabapentin daily  . Pneumonia 2010   hx of  . PTSD (post-traumatic stress disorder)    hx of-no meds  . Seasonal allergies    takes Zyrtec daily and uses Flonase daily as needed  . Stress incontinence, female   . Urgency of urination   . Vitamin D deficiency    takes Vit D daily     Past Surgical History:  Procedure Laterality Date  . ABDOMINAL HYSTERECTOMY     July 2013  . BILATERAL BENIGN BREAST TUMOR REMOVED  YRS AGO  . BREAST SURGERY     2 cyst removed from right breast and tumor removed from left breast  . CARDIAC CATHETERIZATION  09-09-2008   NORMAL CORONARY ARTERIES/ NORMAL LVSF/ CHRONIC RENAL INSUFF.  Marland Kitchen COLONOSCOPY    . CYSTO WITH HYDRODISTENSION  01/07/2012   Procedure: CYSTOSCOPY/HYDRODISTENSION;  Surgeon: Reece Packer, MD;   Location: [facility];  Service: Urology;  Laterality: N/A;   OF BLADDER WITH INSTILLATION OF MARCAINE AND PRYDIUM  . MUSCLE BIOPSY Left 10/04/2015   Procedure: LEFT Vastus Lateralis Muscle Biopsy;  Surgeon: Ashok Pall, MD;  Location: [facility];  Service: Neurosurgery;  Laterality: Left;  LEFT vastus lateralis muscle biopsy  . neck cyst    . TUBAL LIGATION  YRS AGO    Family History  Problem Relation Age of Onset  . Cancer Mother   . Diabetes Mother   . Hypertension Mother   . Cancer Sister   . Hypertension Sister   . Hypertension Son     Social History   Tobacco Use  . Smoking status: Former Smoker    Packs/day: 1.00    Years: 27.00    Pack years: 27.00    Types: Cigarettes  . Smokeless tobacco: Never Used  . Tobacco comment: quit smoking in 1993  Substance Use Topics  . Alcohol use: No    Comment: quit drinking 25+yrs ago  . Drug use: No    Comment: nothing in 25+yrs     ROS   Objective:   Vitals: BP (!) 148/87 (BP Location: Right Arm)   Pulse 82   Temp 98.2 F (36.8 C) (Oral)   Resp 18   SpO2 98%   Physical Exam Constitutional:      General: She is not in acute distress.    Appearance: Normal appearance. She is well-developed. She is not ill-appearing, toxic-appearing or diaphoretic.  HENT:     Head: Normocephalic and atraumatic.     Right Ear: External ear normal.     Left Ear: External ear normal.     Nose: Nose normal.     Mouth/Throat:     Mouth: Mucous membranes are moist.  Eyes:     General: No scleral icterus.       Right eye: No discharge.        Left eye: No discharge.     Extraocular Movements: Extraocular movements intact.     Conjunctiva/sclera: Conjunctivae normal.     Pupils: Pupils are equal, round, and reactive to light.     Comments: Stye of left upper internal eyelid, medial-lateral side.   Cardiovascular:     Rate and Rhythm: Normal rate and regular rhythm.     Pulses: Normal pulses.     Heart sounds:  Normal heart sounds. No murmur heard.  No friction rub. No gallop.   Pulmonary:     Effort: Pulmonary effort is normal. No respiratory distress.     Breath sounds: Normal breath sounds. No stridor. No wheezing, rhonchi or rales.  Musculoskeletal:        General: Tenderness present.     Right lower leg: Edema present.     Left lower leg: Edema present.  Skin:    General: Skin is warm and dry.     Coloration: Skin is not jaundiced.     Findings: No bruising, erythema or rash.  Neurological:     Mental Status: She is alert and oriented to person, place, and time.     Cranial Nerves: No cranial nerve deficit.     Motor: No weakness.     Coordination: Coordination normal.     Gait: Gait normal.     Deep Tendon Reflexes: Reflexes normal.  Psychiatric:        Mood and Affect: Mood normal.        Behavior: Behavior normal.        Thought Content: Thought content normal.        Judgment: Judgment normal.     Assessment and Plan :   PDMP not reviewed this encounter.  1. Peripheral edema   2. Essential hypertension   3.  Elevated blood pressure reading   4. Hordeolum internum left upper eyelid     Do not suspect CHF, clear lung sounds and bp is not severely elevated and no cp, dyspnea. Suspect fluid retention related to diet and possible concurrent venous insufficiency. Will use Lasix, hold HCTZ. Patient had concerns as this kind of medication makes her urinate excessively. Ultimately she was agreeable to 1 week course, supplemented by 26mEq of K+. Counseled on general management of hordeolum. Follow up with eye doctor. In the meantime, use Polytrim eye drops. Counseled patient on potential for adverse effects with medications prescribed/recommended today, ER and return-to-clinic precautions discussed, patient verbalized understanding.   At discharge patient requested I complete paperwork for medical leave from her initial office visits on 07/27/2020 and 08/01/2020 none of which I saw  her for. I recommended she return to her PCP for this. Patient insisted we complete it. We made copies of her paper work and will forward to Dr. Lanny Cramp for review.    Jaynee Eagles, [place] 08/17/20 [phone]

## 2020-08-16 NOTE — ED Triage Notes (Signed)
Pt is here with leg/feet swelling that started this morning, pt states her left eye has a stye on it this started 2 days ago. Pt has not taken any meds to relieve discomfort.

## 2020-08-16 NOTE — Discharge Instructions (Addendum)

## 2020-08-24 ENCOUNTER — Telehealth: Payer: Self-pay

## 2020-08-24 NOTE — Telephone Encounter (Signed)
NOTES ON FILE FROM [facility] [phone], SENT REFERRAL TO SCHEDULING 

## 2020-09-07 ENCOUNTER — Other Ambulatory Visit: Payer: Self-pay

## 2020-09-07 ENCOUNTER — Ambulatory Visit: Payer: Medicare Other | Admitting: Internal Medicine

## 2020-09-07 ENCOUNTER — Encounter: Payer: Self-pay | Admitting: Internal Medicine

## 2020-09-07 VITALS — BP 150/85 | HR 64 | Ht 69.0 in | Wt 243.0 lb

## 2020-09-07 DIAGNOSIS — E7801 Familial hypercholesterolemia: Secondary | ICD-10-CM

## 2020-09-07 DIAGNOSIS — D869 Sarcoidosis, unspecified: Secondary | ICD-10-CM | POA: Diagnosis not present

## 2020-09-07 DIAGNOSIS — E119 Type 2 diabetes mellitus without complications: Secondary | ICD-10-CM

## 2020-09-07 DIAGNOSIS — I700 Atherosclerosis of aorta: Secondary | ICD-10-CM | POA: Diagnosis not present

## 2020-09-07 DIAGNOSIS — I10 Essential (primary) hypertension: Secondary | ICD-10-CM

## 2020-09-07 NOTE — Progress Notes (Signed)
Cardiology Office Note:    Date:  09/07/2020   ID:  [name], DOB [birth year], MRN [id number]  PCP:  Andree Moro, DO  [place] Cardiologist:  No primary care provider on file.  [facility] Electrophysiologist:  None   Referring MD: Andree Moro, DO   CC: Leg swelling Consulted for the evaluation of HTN and LE edema at the behest of [place], [place], DO  History of Present Illness:    [name] is a 69 y.o. female with a hx of Diabetes with Hypertension; angioedema with ACEi (lisinopril), COPD, HLD with DM (105). History of Ascending Aortic Dilation 4.1 cm with Aortic Atherosclerosis.  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  Patient notes  Hip and leg pain.  Had a fall; subsequently had low leg swelling thought to be traumatic (had mechanical fall).  No swelling in her legs in 2006.  Now patient has pretty significant urination with both lasix and thiazide diuretics.  Patient also notes polyuria.  Wrist Cuff BP is 118/70 on average; without thiaziede is SBP 135.  Denies chest and back pain.  No shortness of breath.  No Dyspnea on exertion.  No Marfan's. No cholesterol.  Grandson died suddenly after not walking up without chest pain- (autopsy did not reveal aortopathy had methadone in his sx).  Sister died of heart attack at age 79.    Past Medical History:  Diagnosis Date  . Ankle edema BILATERAL   takes Lasix daily  . Arthritis    BACK, KNEE, HIPS  . Asthma   . Cancer ([place])   . Cataracts, bilateral    immature  . Chronic back pain    d/t injury in 1995  . Chronic bronchitis   . Chronic pain syndrome   . COPD (chronic obstructive pulmonary disease) (HCC) CHRONIC BRONCHITIS --  LAST BOUT  BILATERAL PNEUMONIA 2009   uses QVAR daily and Albuterol daily as needed  . Diabetes mellitus without complication ([place])    takes Glipizide and Metformin daily  . Diverticulosis   . Fibromyalgia   . Frequency of urination   . GERD (gastroesophageal reflux disease)    takes  Nexium daily   . H/O hiatal hernia   . Headache    frequently but related to sinus  . Hepatitis    hx of.Not sure if A,B,C  . History of blood transfusion    no abnormal reaction noted  . History of colon polyps    benign  . History of gastric ulcer   . History of glaucoma STATES TREATED FOR ABOUT 5 YRS  AGO  --- NO ISSUES SINCE  . Hyperlipidemia    was on meds but has been off x 2 months  . Hypertension    takes Atenlol and Triamtere  daily   . Iron deficiency anemia    takes Iron pill daily  . Joint pain   . Joint swelling   . Nocturia   . Peripheral neuropathy    takes Gabapentin daily  . Pneumonia 2010   hx of  . PTSD (post-traumatic stress disorder)    hx of-no meds  . Seasonal allergies    takes Zyrtec daily and uses Flonase daily as needed  . Stress incontinence, female   . Urgency of urination   . Vitamin D deficiency    takes Vit D daily    Past Surgical History:  Procedure Laterality Date  . ABDOMINAL HYSTERECTOMY     July 2013  . BILATERAL BENIGN BREAST TUMOR  REMOVED  YRS AGO  . BREAST SURGERY     2 cyst removed from right breast and tumor removed from left breast  . CARDIAC CATHETERIZATION  09-09-2008   NORMAL CORONARY ARTERIES/ NORMAL LVSF/ CHRONIC RENAL INSUFF.  Marland Kitchen COLONOSCOPY    . CYSTO WITH HYDRODISTENSION  01/07/2012   Procedure: CYSTOSCOPY/HYDRODISTENSION;  Surgeon: Reece Packer, MD;  Location: [facility];  Service: Urology;  Laterality: N/A;   OF BLADDER WITH INSTILLATION OF MARCAINE AND PRYDIUM  . MUSCLE BIOPSY Left 10/04/2015   Procedure: LEFT Vastus Lateralis Muscle Biopsy;  Surgeon: Ashok Pall, MD;  Location: [place] [place] [facility];  Service: Neurosurgery;  Laterality: Left;  LEFT vastus lateralis muscle biopsy  . neck cyst    . TUBAL LIGATION  YRS AGO   Current Medications: Current Meds  Medication Sig  . albuterol (PROVENTIL HFA;VENTOLIN HFA) 108 (90 BASE) MCG/ACT inhaler Inhale 2 puffs into the lungs every 6 (six) hours as  needed for wheezing or shortness of breath. Shortness of breath  . albuterol (VENTOLIN HFA) 108 (90 Base) MCG/ACT inhaler Inhale into the lungs.  Marland Kitchen aspirin-acetaminophen-caffeine (EXCEDRIN EXTRA STRENGTH) 250-250-65 MG per tablet Take 2 tablets by mouth every 6 (six) hours as needed for pain.  Marland Kitchen atenolol (TENORMIN) 50 MG tablet Take 75 mg by mouth daily.  . beclomethasone (QVAR) 80 MCG/ACT inhaler Inhale into the lungs.  . cetirizine (ZYRTEC) 10 MG tablet Take 10 mg by mouth daily.  . Cholecalciferol (VITAMIN D) 2000 UNITS CAPS Take 2,000 Units by mouth daily.  . CYANOCOBALAMIN PO Take 1 tablet by mouth daily.  Marland Kitchen esomeprazole (NEXIUM) 40 MG capsule Take 40 mg by mouth daily before breakfast.  . Ferrous Gluconate (IRON) 240 (27 FE) MG TABS Take 240 mg by mouth daily.   . fluticasone (FLONASE) 50 MCG/ACT nasal spray Place 2 sprays into the nose as needed for allergies. congestion  . gabapentin (NEURONTIN) 600 MG tablet Take 600 mg by mouth 3 (three) times daily.  Marland Kitchen glipiZIDE (GLUCOTROL XL) 10 MG 24 hr tablet Take by mouth. Take 1 tablet (10mg ) by mouth when your blood sugar is over 200  . glucose blood (ACCU-CHEK AVIVA PLUS) test strip   . ibuprofen (ADVIL,MOTRIN) 600 MG tablet Take 600 mg by mouth every 6 (six) hours as needed for headache.  . lidocaine (XYLOCAINE) 2 % solution Use as directed 15 mLs in the mouth or throat as needed for mouth pain.  . metFORMIN (GLUCOPHAGE) 850 MG tablet Take 850 mg by mouth 2 (two) times daily with a meal.  . methocarbamol (ROBAXIN) 750 MG tablet Take 750 mg by mouth 3 (three) times daily.  . Multiple Vitamin (MULTIVITAMIN WITH MINERALS) TABS tablet Take 1 tablet by mouth daily.  . Omega-3 Fatty Acids (FISH OIL PO) Take 1 tablet by mouth daily.  . Oxycodone HCl 20 MG TABS Take 20 mg by mouth every 4 (four) hours.  . SYMBICORT 160-4.5 MCG/ACT inhaler   . triamterene-hydrochlorothiazide (MAXZIDE-25) 37.5-25 MG per tablet Take 1 tablet by mouth daily.   Marland Kitchen  trimethoprim-polymyxin b (POLYTRIM) ophthalmic solution Place 1 drop into the left eye every 4 (four) hours.  . VOLTAREN 1 % GEL Apply 1 application topically 3 (three) times daily as needed. Pain. Apply to knees, hands, and ankles.    Allergies:   Avapro [irbesartan], Lisinopril, Statins, and Latex   Social History   Socioeconomic History  . Marital status: Married    Spouse name: Christia Reading  . Number of children: 5  .  Years of education: 10-14  . Highest education level: Not on file  Occupational History    Comment: CME, lab tech  Tobacco Use  . Smoking status: Former Smoker    Packs/day: 1.00    Years: 27.00    Pack years: 27.00    Types: Cigarettes  . Smokeless tobacco: Never Used  . Tobacco comment: quit smoking in 1993  Substance and Sexual Activity  . Alcohol use: No    Comment: quit drinking 25+yrs ago  . Drug use: No    Comment: nothing in 25+yrs   . Sexual activity: Not Currently    Birth control/protection: Surgical, None  Other Topics Concern  . Not on file  Social History Narrative   Lives with 2 grandchildren 1 stepson, husband   caffeine use - coffee, tea 1-2 c daily   Social Determinants of Health   Financial Resource Strain:   . Difficulty of Paying Living Expenses: Not on file  Food Insecurity:   . Worried About Charity fundraiser in the Last Year: Not on file  . Ran Out of Food in the Last Year: Not on file  Transportation Needs:   . Lack of Transportation (Medical): Not on file  . Lack of Transportation (Non-Medical): Not on file  Physical Activity:   . Days of Exercise per Week: Not on file  . Minutes of Exercise per Session: Not on file  Stress:   . Feeling of Stress : Not on file  Social Connections:   . Frequency of Communication with Friends and Family: Not on file  . Frequency of Social Gatherings with Friends and Family: Not on file  . Attends Religious Services: Not on file  . Active Member of Clubs or Organizations: Not on file  .  Attends Archivist Meetings: Not on file  . Marital Status: Not on file   Family History: The patient's family history includes Cancer in her mother and sister; Diabetes in her mother; Hypertension in her mother, sister, and son.  ROS:   Please see the history of present illness.    All other systems reviewed and are negative.  EKGs/Labs/Other Studies Reviewed:    The following studies were reviewed today:  EKG:  EKG is ordered today.  The ekg ordered today demonstrates sinus rhythm rate 64, occasional PVCs,  09/18/18:  Sinus rhythm rate 61, normal axis no ST/T changes  2006 Echo Report Summary Only: Normal biventricular function without noted AI or Aortic Dilation. 02/10/14: Normal Perfusion Imaging per report and static imaging  Recent Labs: 08/16/2020: BUN 16; Creatinine, Ser 0.69; Potassium 3.8; Sodium 140  Recent Lipid Panel    Component Value Date/Time   CHOL 202 (H) 03/01/2010 2025   TRIG 71 03/01/2010 2025   HDL 103 03/01/2010 2025   CHOLHDL 2.0 Ratio 03/01/2010 2025   VLDL 14 03/01/2010 2025   LDLCALC 85 03/01/2010 2025   LDLDIRECT 68 09/27/2010 2034    Physical Exam:    VS:  BP (!) 150/85   Pulse 64   Ht 5\' 9"  (1.753 m)   Wt 243 lb (110.2 kg)   SpO2 98%   BMI 35.88 kg/m     Wt Readings from Last 3 Encounters:  09/07/20 243 lb (110.2 kg)  07/17/20 (!) 240 lb (108.9 kg)  09/17/18 248 lb (112.5 kg)     GEN: Well nourished, well developed in no acute distress HEENT: Normal NECK: No JVD; No carotid bruits LYMPHATICS: No lymphadenopathy CARDIAC: RRR, no murmurs, rubs,  gallops RESPIRATORY:  Clear to auscultation without rales, wheezing or rhonchi  ABDOMEN: Soft, non-tender, non-distended MUSCULOSKELETAL:  +2 edema bilaterally and warm; No deformity  SKIN: Warm and dry NEUROLOGIC:  Alert and oriented x 3 PSYCHIATRIC:  Normal affect   ASSESSMENT:    1. Diabetes mellitus with coincident hypertension ([place])   2. Familial hypercholesterolemia     3. Aortic atherosclerosis ([place])   4. Sarcoidosis    PLAN:    In order of problems listed above:  1. Diabetes with HTN - ambulatory pressures controlled; patient asked to do amb monitoring; continue mends presently (will look to transition to loop diuretics if able) 2. LE swelling - will get Echo, BMP, and BNP for eval may need  3. HLD - patient deferred trial of medication; will re-eval at next visit 4. Dilated Ascending aorta - last scan in 2017; will repeat 5. PVCs - asymptomatic will continue atenolol  Will see in 2-3 months for further optimization.  Medication Adjustments/Labs and Tests Ordered: Current medicines are reviewed at length with the patient today.  Concerns regarding medicines are outlined above.  Orders Placed This Encounter  Procedures  . EKG 12-Lead   No orders of the defined types were placed in this encounter.   There are no Patient Instructions on file for this visit.   Signed, Werner Lean, MD  09/07/2020 2:24 PM    [place]

## 2020-09-07 NOTE — Patient Instructions (Signed)
Medication Instructions:  Your physician recommends that you continue on your current medications as directed. Please refer to the Current Medication list given to you today.  *If you need a refill on your cardiac medications before your next appointment, please call your pharmacy*   Lab Work: TODAY: BNP, BMET  If you have labs (blood work) drawn today and your tests are completely normal, you will receive your results only by: Marland Kitchen MyChart Message (if you have MyChart) OR . A paper copy in the mail If you have any lab test that is abnormal or we need to change your treatment, we will call you to review the results.   Testing/Procedures: Non-Cardiac CT Angiography (CTA), is a special type of CT scan that uses a computer to produce multi-dimensional views of major blood vessels throughout the body. In CT angiography, a contrast material is injected through an IV to help visualize the blood vessels  Your physician has requested that you have an echocardiogram. Echocardiography is a painless test that uses sound waves to create images of your heart. It provides your doctor with information about the size and shape of your heart and how well your heart's chambers and valves are working. This procedure takes approximately one hour. There are no restrictions for this procedure.      Follow-Up: At [facility], you and your health needs are our priority.  As part of our continuing mission to provide you with exceptional heart care, we have created designated Provider Care Teams.  These Care Teams include your primary Cardiologist (physician) and Advanced Practice Providers (APPs -  Physician Assistants and Nurse Practitioners) who all work together to provide you with the care you need, when you need it.  We recommend signing up for the patient portal called "MyChart".  Sign up information is provided on this After Visit Summary.  MyChart is used to connect with patients for Virtual Visits  (Telemedicine).  Patients are able to view lab/test results, encounter notes, upcoming appointments, etc.  Non-urgent messages can be sent to your provider as well.   To learn more about what you can do with MyChart, go to [email or web address].    Your next appointment:   2 month(s)  The format for your next appointment:   In Person  Provider:   Rudean Haskell, MD   Other Instructions Monitor your Blood Pressure for 1 week and call or send MyChart message in 1 week with those readings

## 2020-09-09 LAB — PRO B NATRIURETIC PEPTIDE: NT-Pro BNP: 227 pg/mL (ref 0–301)

## 2020-09-09 LAB — BASIC METABOLIC PANEL
BUN/Creatinine Ratio: 18 (ref 12–28)
BUN: 16 mg/dL (ref 8–27)
CO2: 23 mmol/L (ref 20–29)
Calcium: 9.7 mg/dL (ref 8.7–10.3)
Chloride: 99 mmol/L (ref 96–106)
Creatinine, Ser: 0.9 mg/dL (ref 0.57–1.00)
GFR calc Af Amer: 76 mL/min/{1.73_m2} (ref >59)
GFR calc non Af Amer: 66 mL/min/{1.73_m2} (ref >59)
Glucose: 101 mg/dL — ABNORMAL HIGH (ref 65–99)
Potassium: 4.7 mmol/L (ref 3.5–5.2)
Sodium: 144 mmol/L (ref 134–144)

## 2020-09-12 ENCOUNTER — Encounter (HOSPITAL_COMMUNITY): Payer: Self-pay

## 2020-09-12 ENCOUNTER — Ambulatory Visit (HOSPITAL_COMMUNITY)
Admission: EM | Admit: 2020-09-12 | Discharge: 2020-09-12 | Disposition: A | Discharge status: Home / Self Care | Payer: Medicare Other

## 2020-09-12 ENCOUNTER — Other Ambulatory Visit: Payer: Self-pay

## 2020-09-12 DIAGNOSIS — R600 Localized edema: Secondary | ICD-10-CM

## 2020-09-12 NOTE — ED Triage Notes (Signed)
Pt here for f/u for swelling of legs and ankles. PT states the legs never really go down much. Pt here to get more time off of work.

## 2020-09-12 NOTE — ED Provider Notes (Signed)
[place]    CSN: [id number] Arrival date & time: 09/12/20  1918      History   Chief Complaint Chief Complaint  Patient presents with  . Leg Swelling    HPI [name] is a 69 y.o. female.   Patient is here for bilateral lower extremity swelling and to be written out of work. She is a known patient to [facility] urgent care and has known lower extremity swelling. She reports her legs have actually gone down a little bit over the last few days after wearing her compression stockings. She reports she is here to be written out of work further and asking for back dating to September 17 and to be read not until September 30. She is most recently seen cardiologist and has planned echocardiogram and CT angiogram planned. She states that her cardiologist told her that she is cleared to work from a cardiology standpoint. She has also seen her primary care and they have also told her that they are unable to accommodate work notes for visits prior to seeing them. She reports she needs excuses for her [place] job, she has been out of work at [employer] since July 27, 2020. She reports she has not been able to work at [employer] because she is on her feet a lot and this causes pain. Reports she has not been elevating her feet that much in the evenings.  Patient does deny worsening symptoms with regard to her lower extremity swelling. Denies chest pain or shortness of breath. Otherwise feeling well. Reports she has been trying to work her other jobs as well.     Past Medical History:  Diagnosis Date  . Ankle edema BILATERAL   takes Lasix daily  . Arthritis    BACK, KNEE, HIPS  . Asthma   . Cancer ([place])   . Cataracts, bilateral    immature  . Chronic back pain    d/t injury in 1995  . Chronic bronchitis   . Chronic pain syndrome   . COPD (chronic obstructive pulmonary disease) (HCC) CHRONIC BRONCHITIS --  LAST BOUT  BILATERAL PNEUMONIA 2009   uses QVAR daily and Albuterol daily as  needed  . Diabetes mellitus without complication ([place])    takes Glipizide and Metformin daily  . Diverticulosis   . Fibromyalgia   . Frequency of urination   . GERD (gastroesophageal reflux disease)    takes Nexium daily   . H/O hiatal hernia   . Headache    frequently but related to sinus  . Hepatitis    hx of.Not sure if A,B,C  . History of blood transfusion    no abnormal reaction noted  . History of colon polyps    benign  . History of gastric ulcer   . History of glaucoma STATES TREATED FOR ABOUT 5 YRS  AGO  --- NO ISSUES SINCE  . Hyperlipidemia    was on meds but has been off x 2 months  . Hypertension    takes Atenlol and Triamtere  daily   . Iron deficiency anemia    takes Iron pill daily  . Joint pain   . Joint swelling   . Nocturia   . Peripheral neuropathy    takes Gabapentin daily  . Pneumonia 2010   hx of  . PTSD (post-traumatic stress disorder)    hx of-no meds  . Seasonal allergies    takes Zyrtec daily and uses Flonase daily as needed  . Stress incontinence,  female   . Urgency of urination   . Vitamin D deficiency    takes Vit D daily    Patient Active Problem List   Diagnosis Date Noted  . Aortic atherosclerosis ([place]) 09/07/2020  . Chest pain 01/18/2014  . Sarcoidosis   . Positive TB test   . History of Bell's palsy   . Pelvic pain in female   . Stress incontinence, female   . Nocturia   . History of glaucoma   . Uterine fibroid   . Hyperlipidemia   . COPD (chronic obstructive pulmonary disease) ([place])   . Iron deficiency anemia   . H/O hiatal hernia   . Fibromyalgia   . Arthritis   . Chronic renal insufficiency   . Diabetes mellitus with coincident hypertension [facility])     Past Surgical History:  Procedure Laterality Date  . ABDOMINAL HYSTERECTOMY     July 2013  . BILATERAL BENIGN BREAST TUMOR REMOVED  YRS AGO  . BREAST SURGERY     2 cyst removed from right breast and tumor removed from left breast  . CARDIAC CATHETERIZATION   09-09-2008   NORMAL CORONARY ARTERIES/ NORMAL LVSF/ CHRONIC RENAL INSUFF.  [facility] Kitchen COLONOSCOPY    . CYSTO WITH HYDRODISTENSION  01/07/2012   Procedure: CYSTOSCOPY/HYDRODISTENSION;  Surgeon: Reece Packer, MD;  Location[facility];  Service: Urology;  Laterality: N/A;   OF BLADDER WITH INSTILLATION OF MARCAINE AND PRYDIUM  . MUSCLE BIOPSY Left 10/04/2015   Procedure: LEFT Vastus Lateralis Muscle Biopsy;  Surgeon: Ashok Pall, MD;  Location: [facility];  Service: Neurosurgery;  Laterality: Left;  LEFT vastus lateralis muscle biopsy  . neck cyst    . TUBAL LIGATION  YRS AGO    OB History    Gravida  5   Para  5   Term  5   Preterm      AB      Living  5     SAB      TAB      Ectopic      Multiple      Live Births               Home Medications    Prior to Admission medications   Medication Sig Start Date End Date Taking? Authorizing Provider  albuterol (PROVENTIL HFA;VENTOLIN HFA) 108 (90 BASE) MCG/ACT inhaler Inhale 2 puffs into the lungs every 6 (six) hours as needed for wheezing or shortness of breath. Shortness of breath    [provider]  albuterol (VENTOLIN HFA) 108 (90 Base) MCG/ACT inhaler Inhale into the lungs.    [provider]  aspirin-acetaminophen-caffeine (EXCEDRIN EXTRA STRENGTH) [phone] MG per tablet Take 2 tablets by mouth every 6 (six) hours as needed for pain.    [provider]  atenolol (TENORMIN) 50 MG tablet Take 75 mg by mouth daily.    [provider]  beclomethasone (QVAR) 80 MCG/ACT inhaler Inhale into the lungs.    [provider]  cetirizine (ZYRTEC) 10 MG tablet Take 10 mg by mouth daily.    [provider]  Cholecalciferol (VITAMIN D) 2000 UNITS CAPS Take 2,000 Units by mouth daily.    [provider]  CYANOCOBALAMIN PO Take 1 tablet by mouth daily.    [provider]  esomeprazole (NEXIUM) 40 MG capsule Take 40 mg by mouth daily before  breakfast.    [provider]  Ferrous Gluconate (IRON) 240 (27 FE) MG TABS Take 240  mg by mouth daily.     [provider]  fluticasone (FLONASE) 50 MCG/ACT nasal spray Place 2 sprays into the nose as needed for allergies. congestion    [provider]  gabapentin (NEURONTIN) 600 MG tablet Take 600 mg by mouth 3 (three) times daily.    [provider]  glipiZIDE (GLUCOTROL XL) 10 MG 24 hr tablet Take by mouth. Take 1 tablet (10mg ) by mouth when your blood sugar is over 200    [provider]  glucose blood (ACCU-CHEK AVIVA PLUS) test strip  08/30/16   [provider]  ibuprofen (ADVIL,MOTRIN) 600 MG tablet Take 600 mg by mouth every 6 (six) hours as needed for headache.    [provider]  lidocaine (XYLOCAINE) 2 % solution Use as directed 15 mLs in the mouth or throat as needed for mouth pain. 09/21/17   Glyn Ade, PA-C  metFORMIN (GLUCOPHAGE) 850 MG tablet Take 850 mg by mouth 2 (two) times daily with a meal.    [provider]  methocarbamol (ROBAXIN) 750 MG tablet Take 750 mg by mouth 3 (three) times daily. 06/13/20   [provider]  Multiple Vitamin (MULTIVITAMIN WITH MINERALS) TABS tablet Take 1 tablet by mouth daily.    [provider]  Omega-3 Fatty Acids (FISH OIL PO) Take 1 tablet by mouth daily.    [provider]  Oxycodone HCl 20 MG TABS Take 20 mg by mouth every 4 (four) hours. 09/12/17   [provider]  SYMBICORT 160-4.5 MCG/ACT inhaler  04/04/20   [provider]  triamterene-hydrochlorothiazide (MAXZIDE-25) 37.5-25 MG per tablet Take 1 tablet by mouth daily.  01/13/14   [provider]  trimethoprim-polymyxin b (POLYTRIM) ophthalmic solution Place 1 drop into the left eye every 4 (four) hours. 08/16/20   Jaynee Eagles, PA-C  VOLTAREN 1 % GEL Apply 1 application topically 3 (three) times daily as needed. Pain. Apply to knees, hands, and ankles. 02/10/15    [provider]    Family History Family History  Problem Relation Age of Onset  . Cancer Mother   . Diabetes Mother   . Hypertension Mother   . Cancer Sister   . Hypertension Sister   . Hypertension Son     Social History Social History   Tobacco Use  . Smoking status: Former Smoker    Packs/day: 1.00    Years: 27.00    Pack years: 27.00    Types: Cigarettes  . Smokeless tobacco: Never Used  . Tobacco comment: quit smoking in 1993  Substance Use Topics  . Alcohol use: No    Comment: quit drinking 25+yrs ago  . Drug use: No    Comment: nothing in 25+yrs      Allergies   Avapro [irbesartan], Lisinopril, Statins, and Latex   Review of Systems Review of Systems   Physical Exam Triage Vital Signs ED Triage Vitals  Enc Vitals Group     BP 09/12/20 2010 (!) 164/89     Pulse Rate 09/12/20 2010 98     Resp 09/12/20 2010 18     Temp 09/12/20 2010 99.7 F (37.6 C)     Temp Source 09/12/20 2010 Oral     SpO2 09/12/20 2010 100 %     Weight 09/12/20 2011 243 lb (110.2 kg)     Height 09/12/20 2011 5\' 10"  (1.778 m)     Head Circumference --      Peak Flow --  Pain Score 09/12/20 2010 8     Pain Loc --      Pain Edu? --      Excl. in [place]? --    No data found.  Updated Vital Signs BP (!) 164/89   Pulse 98   Temp 99.7 F (37.6 C) (Oral)   Resp 18   Ht 5\' 10"  (1.778 m)   Wt 243 lb (110.2 kg)   SpO2 100%   BMI 34.87 kg/m   Visual Acuity Right Eye Distance:   Left Eye Distance:   Bilateral Distance:    Right Eye Near:   Left Eye Near:    Bilateral Near:     Physical Exam Vitals and nursing note reviewed.  Constitutional:      General: She is not in acute distress.    Appearance: She is well-developed. She is not ill-appearing.  HENT:     Head: Normocephalic and atraumatic.  Cardiovascular:     Rate and Rhythm: Normal rate and regular rhythm.     Heart sounds: No murmur heard.   Pulmonary:     Effort: Pulmonary effort is normal. No  respiratory distress.     Breath sounds: Normal breath sounds. No rales.  Musculoskeletal:     Cervical back: Neck supple.     Comments: Bilateral 2+ pitting edema to just below the knee equal bilaterally. Mildly tender. No erythema or significant warmth. No calf tenderness.  Patient is ambulatory without issue.  Skin:    General: Skin is warm and dry.  Neurological:     Mental Status: She is alert.      UC Treatments / Results  Labs (all labs ordered are listed, but only abnormal results are displayed) Labs Reviewed - No data to display  EKG   Radiology No results found.  Procedures Procedures (including critical care time)  Medications Ordered in UC Medications - No data to display  Initial Impression / Assessment and Plan / UC Course  I have reviewed the triage vital signs and the nursing notes.  Pertinent labs & imaging results that were available during my care of the patient were reviewed by me and considered in my medical decision making (see chart for details).     #Lower leg edema Patient is a 69 year old presenting with continued lower leg edema. I discussed with patient that we do not do long-term work notes and that the longest I can do is 3 days. Stated that she needs to follow-up with her primary care to discuss her long-term time out of work. I explicitly told her that the note does not excuse her prior absences and documented this accordingly. I told her that she needs to continue to follow-up with her specialist. Patient is otherwise stable here today appears to have improvement in her lower leg swelling. Patient verbalized understanding that her work note is only from this date moving forward for 3 days. She states it is clear that the urgent care would not do FMLA paperwork. Final Clinical Impressions(s) / UC Diagnoses   Final diagnoses:  Leg edema     Discharge Instructions     Call your primary care tomorrow  I given you the note today with the  understanding that this does not excuse prior absences and we would not fill out FMLA paperwork at the urgent care we need to address this with your primary care provider   ED Prescriptions    None     PDMP not reviewed this encounter.  Purnell Shoemaker, PA-C 09/12/20 2111

## 2020-09-12 NOTE — Discharge Instructions (Signed)
Call your primary care tomorrow  I given you the note today with the understanding that this does not excuse prior absences and we would not fill out FMLA paperwork at the urgent care we need to address this with your primary care provider

## 2020-09-21 ENCOUNTER — Other Ambulatory Visit: Payer: Self-pay

## 2020-09-21 ENCOUNTER — Other Ambulatory Visit (HOSPITAL_COMMUNITY): Payer: Medicare Other

## 2020-09-21 ENCOUNTER — Ambulatory Visit (HOSPITAL_COMMUNITY): Payer: Medicare Other | Attending: Cardiology

## 2020-09-21 ENCOUNTER — Ambulatory Visit (INDEPENDENT_AMBULATORY_CARE_PROVIDER_SITE_OTHER)
Admission: RE | Admit: 2020-09-21 | Discharge: 2020-09-21 | Disposition: A | Discharge status: Home / Self Care | Payer: Medicare Other | Source: Ambulatory Visit | Attending: Internal Medicine | Admitting: Internal Medicine

## 2020-09-21 DIAGNOSIS — D869 Sarcoidosis, unspecified: Secondary | ICD-10-CM

## 2020-09-21 DIAGNOSIS — I700 Atherosclerosis of aorta: Secondary | ICD-10-CM

## 2020-09-21 LAB — ECHOCARDIOGRAM COMPLETE
Area-P 1/2: 3.21 cm2
S' Lateral: 2.6 cm

## 2020-09-21 MED ORDER — IOHEXOL 350 MG/ML SOLN
100.0000 mL | Freq: Once | INTRAVENOUS | Status: AC | PRN
  Administered 2020-09-21: 100 mL via INTRAVENOUS

## 2020-11-10 ENCOUNTER — Other Ambulatory Visit: Payer: Self-pay

## 2020-11-10 ENCOUNTER — Encounter: Payer: Self-pay | Admitting: Internal Medicine

## 2020-11-10 ENCOUNTER — Ambulatory Visit: Payer: Medicare Other | Admitting: Internal Medicine

## 2020-11-10 ENCOUNTER — Encounter: Payer: Self-pay | Admitting: *Deleted

## 2020-11-10 VITALS — BP 158/100 | HR 64 | Ht 69.0 in | Wt 238.0 lb

## 2020-11-10 DIAGNOSIS — I712 Thoracic aortic aneurysm, without rupture, unspecified: Secondary | ICD-10-CM | POA: Insufficient documentation

## 2020-11-10 DIAGNOSIS — I700 Atherosclerosis of aorta: Secondary | ICD-10-CM

## 2020-11-10 DIAGNOSIS — I739 Peripheral vascular disease, unspecified: Secondary | ICD-10-CM | POA: Diagnosis not present

## 2020-11-10 DIAGNOSIS — R600 Localized edema: Secondary | ICD-10-CM | POA: Diagnosis not present

## 2020-11-10 DIAGNOSIS — E119 Type 2 diabetes mellitus without complications: Secondary | ICD-10-CM

## 2020-11-10 DIAGNOSIS — I10 Essential (primary) hypertension: Secondary | ICD-10-CM

## 2020-11-10 DIAGNOSIS — E7801 Familial hypercholesterolemia: Secondary | ICD-10-CM

## 2020-11-10 NOTE — Progress Notes (Signed)
Cardiology Office Note:    Date:  11/10/2020   ID:  [name], DOB [birth year], MRN [id number]  PCP:  Andree Moro, DO  [facility] Cardiologist:  Werner Lean, MD  [facility] Electrophysiologist:  None   Referring MD: Andree Moro, DO  Follow up LE Edema  History of Present Illness:    [name] is a 69 y.o. female with a hx of Diabetes with Hypertension; Angioedema with ACEi (lisinopril), COPD, HLD & Aortic Atherosclerosis, History of Ascending Aortic Dilation (last 4.1 cm), and Sarcoidosis (uveitis). History of prior PVCs. Seen 09/07/20.  In interim, had 9/21 ED visit for out of work paperwork.  As part of last eval, BNP WNL, Echo with normal IVC, LV and RV function, no significant valve abnormality, and stable AA (4.0 cm); CT scan with stable AA.    Patient notes that she is having leg pain and cramps.  She has had swelling since the last time we talked.  Swelling has improved with compression stockings.  The leg pain is worse with exertion.  Patient did not have significant improvement in the past with lasix.  Leg pain is worse with exertion,.  Has cramps.  No cold feeling.  Ambulatory blood pressure 120/80.  Past Medical History:  Diagnosis Date  . Ankle edema BILATERAL   takes Lasix daily  . Arthritis    BACK, KNEE, HIPS  . Asthma   . Cancer ([place])   . Cataracts, bilateral    immature  . Chronic back pain    d/t injury in 1995  . Chronic bronchitis   . Chronic pain syndrome   . COPD (chronic obstructive pulmonary disease) (HCC) CHRONIC BRONCHITIS --  LAST BOUT  BILATERAL PNEUMONIA 2009   uses QVAR daily and Albuterol daily as needed  . Diabetes mellitus without complication ([place])    takes Glipizide and Metformin daily  . Diverticulosis   . Fibromyalgia   . Frequency of urination   . GERD (gastroesophageal reflux disease)    takes Nexium daily   . H/O hiatal hernia   . Headache    frequently but related to sinus  . Hepatitis    hx of.Not  sure if A,B,C  . History of blood transfusion    no abnormal reaction noted  . History of colon polyps    benign  . History of gastric ulcer   . History of glaucoma STATES TREATED FOR ABOUT 5 YRS  AGO  --- NO ISSUES SINCE  . Hyperlipidemia    was on meds but has been off x 2 months  . Hypertension    takes Atenlol and Triamtere  daily   . Iron deficiency anemia    takes Iron pill daily  . Joint pain   . Joint swelling   . Nocturia   . Peripheral neuropathy    takes Gabapentin daily  . Pneumonia 2010   hx of  . PTSD (post-traumatic stress disorder)    hx of-no meds  . Seasonal allergies    takes Zyrtec daily and uses Flonase daily as needed  . Stress incontinence, female   . Urgency of urination   . Vitamin D deficiency    takes Vit D daily    Past Surgical History:  Procedure Laterality Date  . ABDOMINAL HYSTERECTOMY     July 2013  . BILATERAL BENIGN BREAST TUMOR REMOVED  YRS AGO  . BREAST SURGERY     2 cyst removed from right breast and tumor removed from  left breast  . CARDIAC CATHETERIZATION  09-09-2008   NORMAL CORONARY ARTERIES/ NORMAL LVSF/ CHRONIC RENAL INSUFF.  Marland Kitchen COLONOSCOPY    . CYSTO WITH HYDRODISTENSION  01/07/2012   Procedure: CYSTOSCOPY/HYDRODISTENSION;  Surgeon: Reece Packer, MD;  Location: [facility];  Service: Urology;  Laterality: N/A;   OF BLADDER WITH INSTILLATION OF MARCAINE AND PRYDIUM  . MUSCLE BIOPSY Left 10/04/2015   Procedure: LEFT Vastus Lateralis Muscle Biopsy;  Surgeon: Ashok Pall, MD;  Location: [place] [facility];  Service: Neurosurgery;  Laterality: Left;  LEFT vastus lateralis muscle biopsy  . neck cyst    . TUBAL LIGATION  YRS AGO   Current Medications: Current Meds  Medication Sig  . albuterol (PROVENTIL HFA;VENTOLIN HFA) 108 (90 BASE) MCG/ACT inhaler Inhale 2 puffs into the lungs every 6 (six) hours as needed for wheezing or shortness of breath. Shortness of breath  . aspirin-acetaminophen-caffeine (EXCEDRIN EXTRA  STRENGTH) 250-250-65 MG per tablet Take 2 tablets by mouth every 6 (six) hours as needed for pain.  Marland Kitchen atenolol (TENORMIN) 50 MG tablet Take 75 mg by mouth daily.  . beclomethasone (QVAR) 80 MCG/ACT inhaler Inhale into the lungs.  . cetirizine (ZYRTEC) 10 MG tablet Take 10 mg by mouth daily.  . Cholecalciferol (VITAMIN D) 2000 UNITS CAPS Take 2,000 Units by mouth daily.  Marland Kitchen esomeprazole (NEXIUM) 40 MG capsule Take 40 mg by mouth daily before breakfast.  . Ferrous Gluconate (IRON) 240 (27 FE) MG TABS Take 240 mg by mouth daily.   . fluticasone (FLONASE) 50 MCG/ACT nasal spray Place 2 sprays into the nose as needed for allergies. congestion  . gabapentin (NEURONTIN) 600 MG tablet Take 600 mg by mouth 3 (three) times daily.  Marland Kitchen glipiZIDE (GLUCOTROL XL) 10 MG 24 hr tablet Take by mouth. Take 1 tablet (10mg ) by mouth when your blood sugar is over 200  . glucose blood (ACCU-CHEK AVIVA PLUS) test strip   . ibuprofen (ADVIL,MOTRIN) 600 MG tablet Take 600 mg by mouth every 6 (six) hours as needed for headache.  . lidocaine (XYLOCAINE) 2 % solution Use as directed 15 mLs in the mouth or throat as needed for mouth pain.  . metFORMIN (GLUCOPHAGE) 850 MG tablet Take 850 mg by mouth 2 (two) times daily with a meal.  . methocarbamol (ROBAXIN) 750 MG tablet Take 750 mg by mouth 3 (three) times daily.  . Multiple Vitamin (MULTIVITAMIN WITH MINERALS) TABS tablet Take 1 tablet by mouth daily.  . Omega-3 Fatty Acids (FISH OIL PO) Take 3 tablets by mouth daily.   . Oxycodone HCl 20 MG TABS Take 20 mg by mouth every 4 (four) hours.  . SYMBICORT 160-4.5 MCG/ACT inhaler   . triamterene-hydrochlorothiazide (MAXZIDE-25) 37.5-25 MG per tablet Take 1 tablet by mouth daily.   Marland Kitchen trimethoprim-polymyxin b (POLYTRIM) ophthalmic solution Place 1 drop into the left eye every 4 (four) hours.  . VOLTAREN 1 % GEL Apply 1 application topically 3 (three) times daily as needed. Pain. Apply to knees, hands, and ankles.    Allergies:    Avapro [irbesartan], Lisinopril, Statins, and Latex   Social History   Socioeconomic History  . Marital status: Married    Spouse name: Christia Reading  . Number of children: 5  . Years of education: 13-14  . Highest education level: Not on file  Occupational History    Comment: CME, lab tech  Tobacco Use  . Smoking status: Former Smoker    Packs/day: 1.00    Years: 27.00  Pack years: 27.00    Types: Cigarettes  . Smokeless tobacco: Never Used  . Tobacco comment: quit smoking in 1993  Substance and Sexual Activity  . Alcohol use: No    Comment: quit drinking 25+yrs ago  . Drug use: No    Comment: nothing in 25+yrs   . Sexual activity: Not Currently    Birth control/protection: Surgical, None  Other Topics Concern  . Not on file  Social History Narrative   Lives with 2 grandchildren 1 stepson, husband   caffeine use - coffee, tea 1-2 c daily   Social Determinants of Health   Financial Resource Strain:   . Difficulty of Paying Living Expenses: Not on file  Food Insecurity:   . Worried About Charity fundraiser in the Last Year: Not on file  . Ran Out of Food in the Last Year: Not on file  Transportation Needs:   . Lack of Transportation (Medical): Not on file  . Lack of Transportation (Non-Medical): Not on file  Physical Activity:   . Days of Exercise per Week: Not on file  . Minutes of Exercise per Session: Not on file  Stress:   . Feeling of Stress : Not on file  Social Connections:   . Frequency of Communication with Friends and Family: Not on file  . Frequency of Social Gatherings with Friends and Family: Not on file  . Attends Religious Services: Not on file  . Active Member of Clubs or Organizations: Not on file  . Attends Archivist Meetings: Not on file  . Marital Status: Not on file   Family History: The patient's family history includes Cancer in her mother and sister; Diabetes in her mother; Hypertension in her mother, sister, and son.  Sister  had MI at age 88.  ROS:   Please see the history of present illness.    Notes chronic back pain. All other systems reviewed and are negative.  EKGs/Labs/Other Studies Reviewed:    The following studies were reviewed today:  EKG:   09/07/20 Sinus rhythm rate 64, occasional PVCs,  09/18/18:  Sinus rhythm rate 61, normal axis no ST/T changes  2006 Echo Report Summary Only: Normal biventricular function without noted AI or Aortic Dilation. 02/10/14: Normal Perfusion Imaging per report and static imaging  09/21/20 Echo IMPRESSIONS    1. Left ventricular ejection fraction, by estimation, is 60 to 65%. The  left ventricle has normal function. The left ventricle has no regional  wall motion abnormalities. There is mild left ventricular hypertrophy.  Left ventricular diastolic parameters  are consistent with Grade II diastolic dysfunction (pseudonormalization).  2. Right ventricular systolic function is normal. The right ventricular  size is mildly enlarged. There is mildly elevated pulmonary artery  systolic pressure. The estimated right ventricular systolic pressure is  69.6 mmHg.  3. Left atrial size was moderately dilated.  4. Right atrial size was mildly dilated.  5. The mitral valve is normal in structure. Trivial mitral valve  regurgitation. No evidence of mitral stenosis.  6. Tricuspid valve regurgitation is moderate.  7. The aortic valve is tricuspid. Aortic valve regurgitation is not  visualized. Mild aortic valve sclerosis is present, with no evidence of  aortic valve stenosis.  8. Aortic dilatation noted. There is mild dilatation of the ascending  aorta, measuring 40 mm.  9. The inferior vena cava is normal in size with greater than 50%  respiratory variability, suggesting right atrial pressure of 3 mmHg.   Recent Labs: 09/07/2020:  BUN 16; Creatinine, Ser 0.90; NT-Pro BNP 227; Potassium 4.7; Sodium 144  Recent Lipid Panel    Component Value Date/Time   CHOL  202 (H) 03/01/2010 2025   TRIG 71 03/01/2010 2025   HDL 103 03/01/2010 2025   CHOLHDL 2.0 Ratio 03/01/2010 2025   VLDL 14 03/01/2010 2025   LDLCALC 85 03/01/2010 2025   LDLDIRECT 68 09/27/2010 2034    Physical Exam:    VS:  BP (!) 158/100   Pulse 64   Ht 5\' 9"  (1.753 m)   Wt 238 lb (108 kg)   BMI 35.15 kg/m     Wt Readings from Last 3 Encounters:  11/10/20 238 lb (108 kg)  09/12/20 243 lb (110.2 kg)  09/07/20 243 lb (110.2 kg)     GEN: Well nourished, well developed in no acute distress HEENT: Normal NECK: No JVD; No carotid bruits LYMPHATICS: No lymphadenopathy CARDIAC: RRR, no murmurs, rubs, gallops RESPIRATORY:  Clear to auscultation without rales, wheezing or rhonchi  ABDOMEN: Soft, non-tender, non-distended MUSCULOSKELETAL:  +2 edema bilaterally and warm; No deformity  SKIN: Warm and dry, skin is shiny with little to no hair NEUROLOGIC:  Alert and oriented x 3 PSYCHIATRIC:  Normal affect   ASSESSMENT:    1. Claudication in peripheral vascular disease ([place])   2. Claudication ([place])   3. Bilateral lower extremity edema   4. Diabetes mellitus with coincident hypertension ([place])   5. Aortic atherosclerosis ([place])   6. Familial hypercholesterolemia   7. Thoracic aortic aneurysm without rupture (HCC)    PLAN:    In order of problems listed above:  Lower extremity Edema (bilateral) with leg caludication - Echocardiogram WNL - will get Arterial duplex bilateral and ABI - will refer to vascular surgery (if no PADs, may have venous insufficiency) - no evidence of VTE - will increase utilization of compression stockings - CMP, UA, and thyroid studies for rule out hypoproteinemia, nephrotic syndrome, thyroid disease based on prior testing (no proteinuria, but will do CMP and LFTs) - needs to see PCP for work release:  Will draft a letter saying that patient is being evaluated for peripheral arterial disease and would benefit from being on light duty until this is either  confirmed or ruled out (testing is pending).  Diabetes with HTN - continue home medications  - Ambulatory BP 120/80; continue amb monitoring - discussed diet (DASH/low sodium), and exercise/weight loss interventions  Hyperlipidemia Hx of statin related myalgia -LDL goal less than 100 - gave education on dietary changes  Asymptomatic Thoracic Aneurysm - Last at 41 mm, rate of growth 0-1 mm, indexed to height 2.28 - Last scan 09/21/20; will need CT Chest Aorta 08/2021 - Primary Prevention:  HTN, CAD, Smoking Cessation  - 1st degree relative one time screening   - Discussed not using Fluoroquinolones  6 month follow up unless new symptoms or abnormal test results warranting change in plan  Would be reasonable for Virtual follow up Would be reasonable for  APP Follow up   Medication Adjustments/Labs and Tests Ordered: Current medicines are reviewed at length with the patient today.  Concerns regarding medicines are outlined above.  Orders Placed This Encounter  Procedures  . Comprehensive metabolic panel  . Hepatic function panel  . VAS [place] LOWER EXTREMITY ARTERIAL DUPLEX  . VAS [place] ABI WITH/WO TBI   No orders of the defined types were placed in this encounter.   Patient Instructions  Medication Instructions:  Your physician recommends that you continue on your  current medications as directed. Please refer to the Current Medication list given to you today. *If you need a refill on your cardiac medications before your next appointment, please call your pharmacy*   Lab Work: CMP and Lft's today If you have labs (blood work) drawn today and your tests are completely normal, you will receive your results only by: Marland Kitchen MyChart Message (if you have MyChart) OR . A paper copy in the mail If you have any lab test that is abnormal or we need to change your treatment, we will call you to review the results.   Testing/Procedures: Your physician has requested that you have an ankle  brachial index (ABI). During this test an ultrasound and blood pressure cuff are used to evaluate the arteries that supply the arms and legs with blood. Allow thirty minutes for this exam. There are no restrictions or special instructions.     Follow-Up: At [facility], you and your health needs are our priority.  As part of our continuing mission to provide you with exceptional heart care, we have created designated Provider Care Teams.  These Care Teams include your primary Cardiologist (physician) and Advanced Practice Providers (APPs -  Physician Assistants and Nurse Practitioners) who all work together to provide you with the care you need, when you need it.    Your next appointment:   6 month(s)  The format for your next appointment:   In Person  Provider:   Rudean Haskell, MD       Signed, Werner Lean, MD  11/10/2020 3:05 PM    [place]

## 2020-11-10 NOTE — Patient Instructions (Addendum)
Medication Instructions:  Your physician recommends that you continue on your current medications as directed. Please refer to the Current Medication list given to you today. *If you need a refill on your cardiac medications before your next appointment, please call your pharmacy*   Lab Work: CMP and Lft's today If you have labs (blood work) drawn today and your tests are completely normal, you will receive your results only by:  [place] (if you have MyChart) OR  A paper copy in the mail If you have any lab test that is abnormal or we need to change your treatment, we will call you to review the results.   Testing/Procedures: Your physician has requested that you have an ankle brachial index (ABI). During this test an ultrasound and blood pressure cuff are used to evaluate the arteries that supply the arms and legs with blood. Allow thirty minutes for this exam. There are no restrictions or special instructions.     Follow-Up: At [facility], you and your health needs are our priority.  As part of our continuing mission to provide you with exceptional heart care, we have created designated Provider Care Teams.  These Care Teams include your primary Cardiologist (physician) and Advanced Practice Providers (APPs -  Physician Assistants and Nurse Practitioners) who all work together to provide you with the care you need, when you need it.    Your next appointment:   6 month(s)  The format for your next appointment:   In Person  Provider:   Rudean Haskell, MD

## 2020-11-11 LAB — COMPREHENSIVE METABOLIC PANEL
ALT: 18 IU/L (ref 0–32)
AST: 24 IU/L (ref 0–40)
Albumin/Globulin Ratio: 1.3 (ref 1.2–2.2)
Albumin: 4.1 g/dL (ref 3.8–4.8)
Alkaline Phosphatase: 90 IU/L (ref 44–121)
BUN/Creatinine Ratio: 24 (ref 12–28)
BUN: 18 mg/dL (ref 8–27)
Bilirubin Total: 0.3 mg/dL (ref 0.0–1.2)
CO2: 30 mmol/L — ABNORMAL HIGH (ref 20–29)
Calcium: 10 mg/dL (ref 8.7–10.3)
Chloride: 100 mmol/L (ref 96–106)
Creatinine, Ser: 0.75 mg/dL (ref 0.57–1.00)
GFR calc Af Amer: 95 mL/min/{1.73_m2} (ref >59)
GFR calc non Af Amer: 82 mL/min/{1.73_m2} (ref >59)
Globulin, Total: 3.1 g/dL (ref 1.5–4.5)
Glucose: 100 mg/dL — ABNORMAL HIGH (ref 65–99)
Potassium: 4.2 mmol/L (ref 3.5–5.2)
Sodium: 142 mmol/L (ref 134–144)
Total Protein: 7.2 g/dL (ref 6.0–8.5)

## 2020-11-11 LAB — HEPATIC FUNCTION PANEL: Bilirubin, Direct: 0.1 mg/dL (ref 0.00–0.40)

## 2020-11-21 ENCOUNTER — Ambulatory Visit (HOSPITAL_COMMUNITY)
Admission: EM | Admit: 2020-11-21 | Discharge: 2020-11-21 | Disposition: A | Discharge status: Home / Self Care | Payer: Medicare Other | Attending: Family Medicine | Admitting: Family Medicine

## 2020-11-21 ENCOUNTER — Encounter (HOSPITAL_COMMUNITY): Payer: Self-pay

## 2020-11-21 DIAGNOSIS — K0889 Other specified disorders of teeth and supporting structures: Secondary | ICD-10-CM

## 2020-11-21 MED ORDER — CLINDAMYCIN HCL 300 MG PO CAPS: 300.0000 mg | ORAL_CAPSULE | Freq: Three times a day (TID) | ORAL | 0 refills | Status: DC

## 2020-11-21 NOTE — ED Triage Notes (Signed)
Pt presets with dental pain and swelling in the face x 3 days. Reports the filler of 1 of the teeth fell off. Pt is worry as she is diabetic. Pt have a Dentist appt on 12/19/2020.

## 2020-11-22 NOTE — ED Provider Notes (Signed)
[place]   [id number] 11/21/20 Arrival Time: 1919  ASSESSMENT & PLAN:  1. Pain, dental     No sign of abscess requiring I&D at this time. Discussed.  Begin: Meds ordered this encounter  Medications   clindamycin (CLEOCIN) 300 MG capsule    Sig: Take 1 capsule (300 mg total) by mouth 3 (three) times daily.    Dispense:  30 capsule    Refill:  0   She plans dental f/u.  Reviewed expectations re: course of current medical issues. Questions answered. Outlined signs and symptoms indicating need for more acute intervention. Patient verbalized understanding. After Visit Summary given.   SUBJECTIVE:  [name] is a 69 y.o. female who reports abrupt onset of left upper dental pain described as aching/throbbing. Present for 2-3 days. Fever: absent. Tolerating PO intake but reports pain with chewing. Normal swallowing. She does not see a dentist regularly. No neck swelling or pain. OTC analgesics without relief.   OBJECTIVE: Vitals:   11/21/20 2021  BP: 133/81  Pulse: 72  Resp: 18  Temp: 98.5 F (36.9 C)  TempSrc: Oral  SpO2: 97%    General appearance: alert; no distress HENT: normocephalic; atraumatic; dentition: fair; left upper gums without areas of fluctuance, drainage, or bleeding and with tenderness; normal jaw movement without difficulty Neck: supple without LAD; FROM; trachea midline Lungs: normal respirations; unlabored; speaks full sentences without difficulty Skin: warm and dry Psychological: alert and cooperative; normal mood and affect  Allergies  Allergen Reactions   Avapro [Irbesartan] Other (See Comments)    ANGIOEDEMA   Lisinopril Other (See Comments)    ANGIOEDEMA   Statins     Myalgia   Latex Swelling and Rash    Past Medical History:  Diagnosis Date   Ankle edema BILATERAL   takes Lasix daily   Arthritis    BACK, KNEE, HIPS   Asthma    Cancer (HCC)    Cataracts, bilateral    immature   Chronic back pain      d/t injury in 1995   Chronic bronchitis    Chronic pain syndrome    COPD (chronic obstructive pulmonary disease) (HCC) CHRONIC BRONCHITIS --  LAST BOUT  BILATERAL PNEUMONIA 2009   uses QVAR daily and Albuterol daily as needed   Diabetes mellitus without complication (HCC)    takes Glipizide and Metformin daily   Diverticulosis    Fibromyalgia    Frequency of urination    GERD (gastroesophageal reflux disease)    takes Nexium daily    H/O hiatal hernia    Headache    frequently but related to sinus   Hepatitis    hx of.Not sure if A,B,C   History of blood transfusion    no abnormal reaction noted   History of colon polyps    benign   History of gastric ulcer    History of glaucoma STATES TREATED FOR ABOUT 5 YRS  AGO  --- NO ISSUES SINCE   Hyperlipidemia    was on meds but has been off x 2 months   Hypertension    takes Atenlol and Triamtere  daily    Iron deficiency anemia    takes Iron pill daily   Joint pain    Joint swelling    Nocturia    Peripheral neuropathy    takes Gabapentin daily   Pneumonia 2010   hx of   PTSD (post-traumatic stress disorder)    hx of-no meds   Seasonal allergies  takes Zyrtec daily and uses Flonase daily as needed   Stress incontinence, female    Urgency of urination    Vitamin D deficiency    takes Vit D daily   Social History   Socioeconomic History   Marital status: Married    Spouse name: Christia Reading   Number of children: 5   Years of education: 13-14   Highest education level: Not on file  Occupational History    Comment: CME, lab tech  Tobacco Use   Smoking status: Former Smoker    Packs/day: 1.00    Years: 27.00    Pack years: 27.00    Types: Cigarettes   Smokeless tobacco: Never Used   Tobacco comment: quit smoking in 1993  Substance and Sexual Activity   Alcohol use: No    Comment: quit drinking 25+yrs ago   Drug use: No    Comment: nothing in 25+yrs    Sexual activity: Not  Currently    Birth control/protection: Surgical, None  Other Topics Concern   Not on file  Social History Narrative   Lives with 2 grandchildren 1 stepson, husband   caffeine use - coffee, tea 1-2 c daily   Social Determinants of Health   Financial Resource Strain:    Difficulty of Paying Living Expenses: Not on file  Food Insecurity:    Worried About Charity fundraiser in the Last Year: Not on file   [employer] of Food in the Last Year: Not on file  Transportation Needs:    Lack of Transportation (Medical): Not on file   Lack of Transportation (Non-Medical): Not on file  Physical Activity:    Days of Exercise per Week: Not on file   Minutes of Exercise per Session: Not on file  Stress:    Feeling of Stress : Not on file  Social Connections:    Frequency of Communication with Friends and Family: Not on file   Frequency of Social Gatherings with Friends and Family: Not on file   Attends Religious Services: Not on file   Active Member of Clubs or Organizations: Not on file   Attends Archivist Meetings: Not on file   Marital Status: Not on file  Intimate Partner Violence:    Fear of Current or Ex-Partner: Not on file   Emotionally Abused: Not on file   Physically Abused: Not on file   Sexually Abused: Not on file   Family History  Problem Relation Age of Onset   Cancer Mother    Diabetes Mother    Hypertension Mother    Cancer Sister    Hypertension Sister    Hypertension Son    Past Surgical History:  Procedure Laterality Date   ABDOMINAL HYSTERECTOMY     July 2013   BILATERAL BENIGN BREAST TUMOR REMOVED  YRS AGO   BREAST SURGERY     2 cyst removed from right breast and tumor removed from left breast   CARDIAC CATHETERIZATION  09-09-2008   NORMAL CORONARY ARTERIES/ NORMAL LVSF/ CHRONIC RENAL INSUFF.   COLONOSCOPY     CYSTO WITH HYDRODISTENSION  01/07/2012   Procedure: CYSTOSCOPY/HYDRODISTENSION;  Surgeon: Reece Packer,  MD;  Location: [place];  Service: Urology;  Laterality: N/A;   OF BLADDER WITH INSTILLATION OF MARCAINE AND PRYDIUM   MUSCLE BIOPSY Left 10/04/2015   Procedure: LEFT Vastus Lateralis Muscle Biopsy;  Surgeon: Ashok Pall, MD;  Location: [place] [facility];  Service: Neurosurgery;  Laterality: Left;  LEFT vastus lateralis  muscle biopsy   neck cyst     TUBAL LIGATION  YRS Orene Desanctis, MD 11/22/20 [phone]

## 2020-11-30 ENCOUNTER — Other Ambulatory Visit: Payer: Self-pay

## 2020-11-30 ENCOUNTER — Ambulatory Visit (HOSPITAL_COMMUNITY)
Admission: RE | Admit: 2020-11-30 | Discharge: 2020-11-30 | Disposition: A | Discharge status: Home / Self Care | Payer: Medicare Other | Source: Ambulatory Visit | Attending: Cardiology | Admitting: Cardiology

## 2020-11-30 DIAGNOSIS — R600 Localized edema: Secondary | ICD-10-CM | POA: Diagnosis present

## 2020-11-30 DIAGNOSIS — I739 Peripheral vascular disease, unspecified: Secondary | ICD-10-CM | POA: Insufficient documentation

## 2021-01-25 ENCOUNTER — Ambulatory Visit (HOSPITAL_COMMUNITY)
Admission: EM | Admit: 2021-01-25 | Discharge: 2021-01-25 | Disposition: A | Discharge status: Home / Self Care | Payer: Medicare Other | Attending: Family Medicine | Admitting: Family Medicine

## 2021-01-25 ENCOUNTER — Encounter (HOSPITAL_COMMUNITY): Payer: Self-pay | Admitting: Emergency Medicine

## 2021-01-25 ENCOUNTER — Other Ambulatory Visit: Payer: Self-pay

## 2021-01-25 DIAGNOSIS — K0889 Other specified disorders of teeth and supporting structures: Secondary | ICD-10-CM

## 2021-01-25 DIAGNOSIS — I10 Essential (primary) hypertension: Secondary | ICD-10-CM

## 2021-01-25 MED ORDER — CLINDAMYCIN HCL 300 MG PO CAPS: 300.0000 mg | ORAL_CAPSULE | Freq: Three times a day (TID) | ORAL | 0 refills | Status: DC

## 2021-01-25 NOTE — ED Provider Notes (Addendum)
[place]   [id number] 01/25/21 Arrival Time: [place]:  1. Pain, dental   2. Elevated blood pressure reading in office with diagnosis of hypertension     No sign of abscess requiring I&D at this time. Discussed.  Meds ordered this encounter  Medications  . clindamycin (CLEOCIN) 300 MG capsule    Sig: Take 1 capsule (300 mg total) by mouth 3 (three) times daily.    Dispense:  30 capsule    Refill:  0   She is calling her dentist tomorrow to schedule f/u.    Discharge Instructions      Your blood pressure was noted to be elevated during your visit today. If you are currently taking medication for high blood pressure, please ensure you are taking this as directed. If you do not have a history of high blood pressure and your blood pressure remains persistently elevated, you may need to begin taking a medication at some point. You may return here within the next few days to recheck if unable to see your primary care provider or if you do not have a one.  BP (!) 176/87 (BP Location: Left Arm)   Pulse 73   Temp 98 F (36.7 C) (Oral)   Resp 18   SpO2 100%   BP Readings from Last 3 Encounters:  01/25/21 (!) 176/87  11/21/20 133/81  11/10/20 (!) 158/100         Reviewed expectations re: course of current medical issues. Questions answered. Outlined signs and symptoms indicating need for more acute intervention. Patient verbalized understanding. After Visit Summary given.   SUBJECTIVE:  [name] is a 69 y.o. female who reports gradual onset of left upper dental pain described as "aching and red". Present for a few days. Fever: absent. Tolerating PO intake but reports pain with chewing. Normal swallowing. She has a dentist who follows here and reports much needed dental work but cannot afford at this time.. No neck swelling or pain. OTC analgesics without relief.  Increased blood pressure noted today. Reports that she is treated for  HTN. She reports taking medications as instructed, no chest pain on exertion, no dyspnea on exertion, no swelling of ankles, no orthostatic dizziness or lightheadedness, no orthopnea or paroxysmal nocturnal dyspnea and no palpitations.    OBJECTIVE: Vitals:   01/25/21 1923  BP: (!) 176/87  Pulse: 73  Resp: 18  Temp: 98 F (36.7 C)  TempSrc: Oral  SpO2: 100%    General appearance: alert; no distress HENT: normocephalic; atraumatic; dentition: fair; left upper gums with areas of fluctuance, drainage, or bleeding and with tenderness to palpation and slight overlying erythema; normal jaw movement without difficulty Neck: supple without LAD; FROM; trachea midline Lungs: normal respirations; unlabored; speaks full sentences without difficulty Skin: warm and dry Psychological: alert and cooperative; normal mood and affect  Allergies  Allergen Reactions  . Avapro [Irbesartan] Other (See Comments)    ANGIOEDEMA  . Lisinopril Other (See Comments)    ANGIOEDEMA  . Statins     Myalgia  . Latex Swelling and Rash    Past Medical History:  Diagnosis Date  . Ankle edema BILATERAL   takes Lasix daily  . Arthritis    BACK, KNEE, HIPS  . Asthma   . Cancer ([place])   . Cataracts, bilateral    immature  . Chronic back pain    d/t injury in 1995  . Chronic bronchitis   . Chronic pain syndrome   .  COPD (chronic obstructive pulmonary disease) (HCC) CHRONIC BRONCHITIS --  LAST BOUT  BILATERAL PNEUMONIA 2009   uses QVAR daily and Albuterol daily as needed  . Diabetes mellitus without complication ([place])    takes Glipizide and Metformin daily  . Diverticulosis   . Fibromyalgia   . Frequency of urination   . GERD (gastroesophageal reflux disease)    takes Nexium daily   . H/O hiatal hernia   . Headache    frequently but related to sinus  . Hepatitis    hx of.Not sure if A,B,C  . History of blood transfusion    no abnormal reaction noted  . History of colon polyps    benign  .  History of gastric ulcer   . History of glaucoma STATES TREATED FOR ABOUT 5 YRS  AGO  --- NO ISSUES SINCE  . Hyperlipidemia    was on meds but has been off x 2 months  . Hypertension    takes Atenlol and Triamtere  daily   . Iron deficiency anemia    takes Iron pill daily  . Joint pain   . Joint swelling   . Nocturia   . Peripheral neuropathy    takes Gabapentin daily  . Pneumonia 2010   hx of  . PTSD (post-traumatic stress disorder)    hx of-no meds  . Seasonal allergies    takes Zyrtec daily and uses Flonase daily as needed  . Stress incontinence, female   . Urgency of urination   . Vitamin D deficiency    takes Vit D daily   Social History   Socioeconomic History  . Marital status: Married    Spouse name: Christia Reading  . Number of children: 5  . Years of education: 13-14  . Highest education level: Not on file  Occupational History    Comment: CME, lab tech  Tobacco Use  . Smoking status: Former Smoker    Packs/day: 1.00    Years: 27.00    Pack years: 27.00    Types: Cigarettes  . Smokeless tobacco: Never Used  . Tobacco comment: quit smoking in 1993  Substance and Sexual Activity  . Alcohol use: No    Comment: quit drinking 25+yrs ago  . Drug use: No    Comment: nothing in 25+yrs   . Sexual activity: Not Currently    Birth control/protection: Surgical, None  Other Topics Concern  . Not on file  Social History Narrative   Lives with 2 grandchildren 1 stepson, husband   caffeine use - coffee, tea 1-2 c daily   Social Determinants of Health   Financial Resource Strain: Not on file  Food Insecurity: Not on file  Transportation Needs: Not on file  Physical Activity: Not on file  Stress: Not on file  Social Connections: Not on file  Intimate Partner Violence: Not on file   Family History  Problem Relation Age of Onset  . Cancer Mother   . Diabetes Mother   . Hypertension Mother   . Cancer Sister   . Hypertension Sister   . Hypertension Son    Past  Surgical History:  Procedure Laterality Date  . ABDOMINAL HYSTERECTOMY     July 2013  . BILATERAL BENIGN BREAST TUMOR REMOVED  YRS AGO  . BREAST SURGERY     2 cyst removed from right breast and tumor removed from left breast  . CARDIAC CATHETERIZATION  09-09-2008   NORMAL CORONARY ARTERIES/ NORMAL LVSF/ CHRONIC RENAL INSUFF.  Marland Kitchen COLONOSCOPY    .  CYSTO WITH HYDRODISTENSION  01/07/2012   Procedure: CYSTOSCOPY/HYDRODISTENSION;  Surgeon: Reece Packer, MD;  Location: [facility];  Service: Urology;  Laterality: N/A;   OF BLADDER WITH INSTILLATION OF MARCAINE AND PRYDIUM  . MUSCLE BIOPSY Left 10/04/2015   Procedure: LEFT Vastus Lateralis Muscle Biopsy;  Surgeon: Ashok Pall, MD;  Location: [place] [facility];  Service: Neurosurgery;  Laterality: Left;  LEFT vastus lateralis muscle biopsy  . neck cyst    . TUBAL LIGATION  YRS Orene Desanctis, MD 01/25/21 Odella Aquas, MD 01/25/21 [phone]

## 2021-01-25 NOTE — Discharge Instructions (Signed)
Your blood pressure was noted to be elevated during your visit today. If you are currently taking medication for high blood pressure, please ensure you are taking this as directed. If you do not have a history of high blood pressure and your blood pressure remains persistently elevated, you may need to begin taking a medication at some point. You may return here within the next few days to recheck if unable to see your primary care provider or if you do not have a one.  BP (!) 176/87 (BP Location: Left Arm)    Pulse 73    Temp 98 F (36.7 C) (Oral)    Resp 18    SpO2 100%   BP Readings from Last 3 Encounters:  01/25/21 (!) 176/87  11/21/20 133/81  11/10/20 (!) 158/100

## 2021-01-25 NOTE — ED Triage Notes (Signed)
Pt presents with dental pain and abscess.

## 2021-06-06 ENCOUNTER — Encounter (HOSPITAL_COMMUNITY): Payer: Self-pay

## 2021-06-06 ENCOUNTER — Ambulatory Visit (HOSPITAL_COMMUNITY)
Admission: EM | Admit: 2021-06-06 | Discharge: 2021-06-06 | Disposition: A | Discharge status: Home / Self Care | Payer: Medicare Other

## 2021-06-06 ENCOUNTER — Other Ambulatory Visit: Payer: Self-pay

## 2021-06-06 DIAGNOSIS — N644 Mastodynia: Secondary | ICD-10-CM

## 2021-06-06 NOTE — ED Provider Notes (Signed)
[place]    CSN: [id number] Arrival date & time: 06/06/21  1856      History   Chief Complaint Chief Complaint  Patient presents with   Breast Pain   Breast Mass    HPI [name] is a 70 y.o. female.   Patient here for evaluation of bilateral breast pain and nodules for about 2 weeks.  Reports having similar symptoms in the past with breast infections.  Reports last mammogram was 2021.  Denies any dimpling of the skin, swelling, tenderness, nipple changes or discharge.  Denies any trauma, injury, or other precipitating event.  Denies any specific alleviating or aggravating factors.  Denies any fevers, chest pain, shortness of breath, N/V/D, numbness, tingling, weakness, abdominal pain, or headaches.    The history is provided by the patient.   Past Medical History:  Diagnosis Date   Ankle edema BILATERAL   takes Lasix daily   Arthritis    BACK, KNEE, HIPS   Asthma    Cancer (HCC)    Cataracts, bilateral    immature   Chronic back pain    d/t injury in 1995   Chronic bronchitis    Chronic pain syndrome    COPD (chronic obstructive pulmonary disease) (HCC) CHRONIC BRONCHITIS --  LAST BOUT  BILATERAL PNEUMONIA 2009   uses QVAR daily and Albuterol daily as needed   Diabetes mellitus without complication (HCC)    takes Glipizide and Metformin daily   Diverticulosis    Fibromyalgia    Frequency of urination    GERD (gastroesophageal reflux disease)    takes Nexium daily    H/O hiatal hernia    Headache    frequently but related to sinus   Hepatitis    hx of.Not sure if A,B,C   History of blood transfusion    no abnormal reaction noted   History of colon polyps    benign   History of gastric ulcer    History of glaucoma STATES TREATED FOR ABOUT 5 YRS  AGO  --- NO ISSUES SINCE   Hyperlipidemia    was on meds but has been off x 2 months   Hypertension    takes Atenlol and Triamtere  daily    Iron deficiency anemia    takes Iron pill daily    Joint pain    Joint swelling    Nocturia    Peripheral neuropathy    takes Gabapentin daily   Pneumonia 2010   hx of   PTSD (post-traumatic stress disorder)    hx of-no meds   Seasonal allergies    takes Zyrtec daily and uses Flonase daily as needed   Stress incontinence, female    Urgency of urination    Vitamin D deficiency    takes Vit D daily    Patient Active Problem List   Diagnosis Date Noted   Claudication in peripheral vascular disease ([place]) 11/10/2020   Bilateral lower extremity edema 11/10/2020   Thoracic aortic aneurysm without rupture ([place]) 11/10/2020   Aortic atherosclerosis ([place]) 09/07/2020   Chest pain 01/18/2014   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    History of Bell's palsy    Pelvic pain in female    Stress incontinence, female    Nocturia    History of glaucoma    Uterine fibroid    Hyperlipidemia    COPD (chronic obstructive pulmonary disease) (HCC)    Iron deficiency anemia    H/O hiatal hernia  Fibromyalgia    Arthritis    Chronic renal insufficiency    Diabetes mellitus with coincident hypertension [facility])     Past Surgical History:  Procedure Laterality Date   ABDOMINAL HYSTERECTOMY     July 2013   BILATERAL BENIGN BREAST TUMOR REMOVED  YRS AGO   BREAST SURGERY     2 cyst removed from right breast and tumor removed from left breast   CARDIAC CATHETERIZATION  09-09-2008   NORMAL CORONARY ARTERIES/ NORMAL LVSF/ CHRONIC RENAL INSUFF.   COLONOSCOPY     CYSTO WITH HYDRODISTENSION  01/07/2012   Procedure: CYSTOSCOPY/HYDRODISTENSION;  Surgeon: Reece Packer, MD;  Location: [facility];  Service: Urology;  Laterality: N/A;   OF BLADDER WITH INSTILLATION OF MARCAINE AND PRYDIUM   MUSCLE BIOPSY Left 10/04/2015   Procedure: LEFT Vastus Lateralis Muscle Biopsy;  Surgeon: Ashok Pall, MD;  Location: [place] [facility];  Service: Neurosurgery;  Laterality: Left;  LEFT vastus lateralis muscle biopsy   neck cyst     TUBAL LIGATION  YRS AGO     OB History     Gravida  5   Para  5   Term  5   Preterm      AB      Living  5      SAB      IAB      Ectopic      Multiple      Live Births               Home Medications    Prior to Admission medications   Medication Sig Start Date End Date Taking? Authorizing Provider  albuterol (PROVENTIL HFA;VENTOLIN HFA) 108 (90 BASE) MCG/ACT inhaler Inhale 2 puffs into the lungs every 6 (six) hours as needed for wheezing or shortness of breath. Shortness of breath   Yes [provider]  aspirin-acetaminophen-caffeine (EXCEDRIN MIGRAINE) [id number] MG tablet Take 2 tablets by mouth every 6 (six) hours as needed for pain.   Yes [provider]  atenolol (TENORMIN) 50 MG tablet Take 75 mg by mouth daily.   Yes [provider]  beclomethasone (QVAR) 80 MCG/ACT inhaler Inhale into the lungs.   Yes [provider]  cetirizine (ZYRTEC) 10 MG tablet Take 10 mg by mouth daily.   Yes [provider]  Cholecalciferol (VITAMIN D) 2000 UNITS CAPS Take 2,000 Units by mouth daily.   Yes [provider]  esomeprazole (NEXIUM) 40 MG capsule Take 40 mg by mouth daily before breakfast.   Yes [provider]  Ferrous Gluconate (IRON) 240 (27 FE) MG TABS Take 240 mg by mouth daily.    Yes [provider]  fluticasone (FLONASE) 50 MCG/ACT nasal spray Place 2 sprays into the nose as needed for allergies. congestion   Yes [provider]  gabapentin (NEURONTIN) 600 MG tablet Take 600 mg by mouth 3 (three) times daily.   Yes [provider]  glipiZIDE (GLUCOTROL XL) 10 MG 24 hr tablet Take by mouth. Take 1 tablet (10mg ) by mouth when your blood sugar is over 200   Yes [provider]  ibuprofen (ADVIL,MOTRIN) 600 MG tablet Take 600 mg by mouth every 6 (six) hours as needed for headache.   Yes [provider]  metFORMIN (GLUCOPHAGE) 850 MG tablet Take 850 mg by mouth 2 (two) times daily with  a meal.   Yes [provider]  methocarbamol (ROBAXIN) 750 MG tablet Take 750 mg by mouth 3 (three)  times daily. 06/13/20  Yes [provider]  Multiple Vitamin (MULTIVITAMIN WITH MINERALS) TABS tablet Take 1 tablet by mouth daily.   Yes [provider]  Oxycodone HCl 20 MG TABS Take 20 mg by mouth every 4 (four) hours. 09/12/17  Yes [provider]  triamterene-hydrochlorothiazide (MAXZIDE-25) 37.5-25 MG per tablet Take 1 tablet by mouth daily.  01/13/14  Yes [provider]  UNABLE TO FIND Med Name: pt reports using estradol cream two times per day   Yes [provider]  clindamycin (CLEOCIN) 300 MG capsule Take 1 capsule (300 mg total) by mouth 3 (three) times daily. 01/25/21   Vanessa Kick, MD  glucose blood (ACCU-CHEK AVIVA PLUS) test strip  08/30/16   [provider]  lidocaine (XYLOCAINE) 2 % solution Use as directed 15 mLs in the mouth or throat as needed for mouth pain. 09/21/17   Glyn Ade, PA-C  Omega-3 Fatty Acids (FISH OIL PO) Take 3 tablets by mouth daily.     [provider]  [facility] 160-4.5 MCG/ACT inhaler  04/04/20 11/21/20  [provider]    Family History Family History  Problem Relation Age of Onset   Cancer Mother    Diabetes Mother    Hypertension Mother    Rheum arthritis Father    Cancer Sister    Hypertension Sister    Hypertension Son    Heart Problems Maternal Grandfather     Social History Social History   Tobacco Use   Smoking status: Former    Packs/day: 1.00    Years: 27.00    Pack years: 27.00    Types: Cigarettes   Smokeless tobacco: Never   Tobacco comments:    quit smoking in 1993  Substance Use Topics   Alcohol use: No    Comment: quit drinking 25+yrs ago   Drug use: No    Comment: nothing in 25+yrs      Allergies   Avapro [irbesartan], Lisinopril, Statins, and Latex   Review of Systems Review of Systems  All other systems reviewed and are  negative.   Physical Exam Triage Vital Signs ED Triage Vitals  Enc Vitals Group     BP 06/06/21 1923 (!) 158/85     Pulse Rate 06/06/21 1923 68     Resp 06/06/21 1923 18     Temp 06/06/21 1923 98.3 F (36.8 C)     Temp Source 06/06/21 1923 Oral     SpO2 06/06/21 1923 100 %     Weight --      Height --      Head Circumference --      Peak Flow --      Pain Score 06/06/21 1913 6     Pain Loc --      Pain Edu? --      Excl. in [place]? --    No data found.  Updated Vital Signs BP (!) 158/85 (BP Location: Left Arm)   Pulse 68   Temp 98.3 F (36.8 C) (Oral)   Resp 18   SpO2 100%   Visual Acuity Right Eye Distance:   Left Eye Distance:   Bilateral Distance:    Right Eye Near:   Left Eye Near:    Bilateral Near:     Physical Exam Vitals and nursing note reviewed.  Constitutional:      General: She is not in acute distress.    Appearance: Normal appearance. She is not ill-appearing, toxic-appearing or diaphoretic.  HENT:  Head: Normocephalic and atraumatic.  Eyes:     Conjunctiva/sclera: Conjunctivae normal.  Cardiovascular:     Rate and Rhythm: Normal rate.     Pulses: Normal pulses.  Pulmonary:     Effort: Pulmonary effort is normal.  Chest:     Chest wall: No tenderness.  Breasts:    Right: Normal. No swelling, bleeding, inverted nipple, mass, nipple discharge, skin change, tenderness, axillary adenopathy or supraclavicular adenopathy.     Left: Normal. No swelling, bleeding, inverted nipple, mass, nipple discharge, skin change, tenderness, axillary adenopathy or supraclavicular adenopathy.  Abdominal:     General: Abdomen is flat.  Musculoskeletal:        General: Normal range of motion.     Cervical back: Normal range of motion.  Lymphadenopathy:     Upper Body:     Right upper body: No supraclavicular, axillary or pectoral adenopathy.     Left upper body: No supraclavicular, axillary or pectoral adenopathy.  Skin:    General: Skin is warm and dry.   Neurological:     General: No focal deficit present.     Mental Status: She is alert and oriented to person, place, and time.  Psychiatric:        Mood and Affect: Mood normal.     UC Treatments / Results  Labs (all labs ordered are listed, but only abnormal results are displayed) Labs Reviewed - No data to display  EKG   Radiology No results found.  Procedures Procedures (including critical care time)  Medications Ordered in UC Medications - No data to display  Initial Impression / Assessment and Plan / UC Course  I have reviewed the triage vital signs and the nursing notes.  Pertinent labs & imaging results that were available during my care of the patient were reviewed by me and considered in my medical decision making (see chart for details).    Assessment negative for red flags or concerns.  Patient may take ibuprofen and/or Tylenol as needed for pain.  Patient offered a prescription for ibuprofen or naproxen but declines stating that she already has them at home.  May apply heat, ice, or alternate between heat and ice for comfort.  Patient was strongly encouraged to follow-up with her primary care provider and have a repeat mammogram for further evaluation of breast tenderness and nodules. Final Clinical Impressions(s) / UC Diagnoses   Final diagnoses:  Breast pain     Discharge Instructions      Take Ibuprofen and/or Tylenol as needed for pain relief.   You can apply heat, ice, or alternate between heat and ice for comfort.    Get your mammogram scheduled as soon as possible.    Return or go to the Emergency Department if symptoms worsen or do not improve in the next few days.      ED Prescriptions   None    PDMP not reviewed this encounter.   Pearson Forster, NP 06/06/21 1952

## 2021-06-06 NOTE — Discharge Instructions (Addendum)
Take Ibuprofen and/or Tylenol as needed for pain relief.   You can apply heat, ice, or alternate between heat and ice for comfort.    Get your mammogram scheduled as soon as possible.    Return or go to the Emergency Department if symptoms worsen or do not improve in the next few days.

## 2021-06-06 NOTE — ED Triage Notes (Signed)
Pt reports pain in both breasts for about 2 weeks. States has had infection in breasts in the past and feels similar nodules to when this happened.

## 2021-10-01 ENCOUNTER — Encounter (HOSPITAL_COMMUNITY): Payer: Self-pay | Admitting: *Deleted

## 2021-10-01 ENCOUNTER — Other Ambulatory Visit: Payer: Self-pay

## 2021-10-01 ENCOUNTER — Emergency Department (HOSPITAL_COMMUNITY)
Admission: EM | Admit: 2021-10-01 | Discharge: 2021-10-01 | Disposition: A | Discharge status: Home / Self Care | Payer: Medicare Other | Attending: Emergency Medicine | Admitting: Emergency Medicine

## 2021-10-01 ENCOUNTER — Emergency Department (HOSPITAL_COMMUNITY): Payer: Medicare Other

## 2021-10-01 DIAGNOSIS — Z7984 Long term (current) use of oral hypoglycemic drugs: Secondary | ICD-10-CM | POA: Insufficient documentation

## 2021-10-01 DIAGNOSIS — Z859 Personal history of malignant neoplasm, unspecified: Secondary | ICD-10-CM | POA: Insufficient documentation

## 2021-10-01 DIAGNOSIS — Z87891 Personal history of nicotine dependence: Secondary | ICD-10-CM | POA: Diagnosis not present

## 2021-10-01 DIAGNOSIS — J45909 Unspecified asthma, uncomplicated: Secondary | ICD-10-CM | POA: Insufficient documentation

## 2021-10-01 DIAGNOSIS — R0789 Other chest pain: Secondary | ICD-10-CM | POA: Diagnosis not present

## 2021-10-01 DIAGNOSIS — E119 Type 2 diabetes mellitus without complications: Secondary | ICD-10-CM | POA: Diagnosis not present

## 2021-10-01 DIAGNOSIS — Z91040 Latex allergy status: Secondary | ICD-10-CM | POA: Diagnosis not present

## 2021-10-01 DIAGNOSIS — M79661 Pain in right lower leg: Secondary | ICD-10-CM | POA: Insufficient documentation

## 2021-10-01 DIAGNOSIS — M79662 Pain in left lower leg: Secondary | ICD-10-CM | POA: Insufficient documentation

## 2021-10-01 DIAGNOSIS — Z79899 Other long term (current) drug therapy: Secondary | ICD-10-CM | POA: Diagnosis not present

## 2021-10-01 DIAGNOSIS — I10 Essential (primary) hypertension: Secondary | ICD-10-CM | POA: Diagnosis not present

## 2021-10-01 DIAGNOSIS — J449 Chronic obstructive pulmonary disease, unspecified: Secondary | ICD-10-CM | POA: Insufficient documentation

## 2021-10-01 DIAGNOSIS — R079 Chest pain, unspecified: Secondary | ICD-10-CM | POA: Diagnosis present

## 2021-10-01 LAB — COMPREHENSIVE METABOLIC PANEL
ALT: 22 U/L (ref 0–44)
AST: 30 U/L (ref 15–41)
Albumin: 3.8 g/dL (ref 3.5–5.0)
Alkaline Phosphatase: 66 U/L (ref 38–126)
Anion gap: 9 (ref 5–15)
BUN: 11 mg/dL (ref 8–23)
CO2: 28 mmol/L (ref 22–32)
Calcium: 9.2 mg/dL (ref 8.9–10.3)
Chloride: 100 mmol/L (ref 98–111)
Creatinine, Ser: 0.65 mg/dL (ref 0.44–1.00)
GFR, Estimated: >60 mL/min (ref >60)
Glucose, Bld: 106 mg/dL — ABNORMAL HIGH (ref 70–99)
Potassium: 3.6 mmol/L (ref 3.5–5.1)
Sodium: 137 mmol/L (ref 135–145)
Total Bilirubin: 0.4 mg/dL (ref 0.3–1.2)
Total Protein: 7 g/dL (ref 6.5–8.1)

## 2021-10-01 LAB — CBC WITH DIFFERENTIAL/PLATELET
Abs Immature Granulocytes: 0.01 10*3/uL (ref 0.00–0.07)
Basophils Absolute: 0 10*3/uL (ref 0.0–0.1)
Basophils Relative: 1 %
Eosinophils Absolute: 0.1 10*3/uL (ref 0.0–0.5)
Eosinophils Relative: 2 %
HCT: 36.1 % (ref 36.0–46.0)
Hemoglobin: 11.3 g/dL — ABNORMAL LOW (ref 12.0–15.0)
Immature Granulocytes: 0 %
Lymphocytes Relative: 43 %
Lymphs Abs: 2.1 10*3/uL (ref 0.7–4.0)
MCH: 28.5 pg (ref 26.0–34.0)
MCHC: 31.3 g/dL (ref 30.0–36.0)
MCV: 90.9 fL (ref 80.0–100.0)
Monocytes Absolute: 0.4 10*3/uL (ref 0.1–1.0)
Monocytes Relative: 8 %
Neutro Abs: 2.3 10*3/uL (ref 1.7–7.7)
Neutrophils Relative %: 46 %
Platelets: 201 10*3/uL (ref 150–400)
RBC: 3.97 MIL/uL (ref 3.87–5.11)
RDW: 14.5 % (ref 11.5–15.5)
WBC: 4.8 10*3/uL (ref 4.0–10.5)
nRBC: 0 % (ref 0.0–0.2)

## 2021-10-01 LAB — TROPONIN I (HIGH SENSITIVITY)
Troponin I (High Sensitivity): 4 ng/L (ref <18)
Troponin I (High Sensitivity): 4 ng/L (ref <18)

## 2021-10-01 NOTE — ED Provider Notes (Signed)
Emergency Medicine Provider Triage Evaluation Note  [name] , a 70 y.o. female  was evaluated in triage.  Pt complains of vaginal onset, constant, achy, diffuse chest pain that began earlier today while she was upset that her Internet was not working.  She denies any shortness of breath, nausea, vomiting, diaphoresis.  She took 324 mg aspirin at home.   Review of Systems  Positive: + chest pain Negative: - nausea, vomiting, diaphoresis  Physical Exam  SpO2 97%  Gen:   Awake, no distress   Resp:  Normal effort  MSK:   Moves extremities without difficulty  Other:    Medical Decision Making  Medically screening exam initiated at 12:07 PM.  Appropriate orders placed.  RUARI DUGGAN was informed that the remainder of the evaluation will be completed by another provider, this initial triage assessment does not replace that evaluation, and the importance of remaining in the ED until their evaluation is complete.     Eustaquio Maize, PA-C 10/01/21 [place], [place], DO 10/01/21 1246

## 2021-10-01 NOTE — ED Provider Notes (Signed)
[facility] EMERGENCY DEPARTMENT Provider Note   CSN: [id number] Arrival date & time: 10/01/21  1129     History Chief Complaint  Patient presents with   Chest Pain    [name] is a 70 y.o. female.   Chest Pain Associated symptoms: no abdominal pain, no back pain and no shortness of breath   Patient presents chest pain.  Anterior chest.  Worse today.  States that she has had pain like this before.  States previously had been an infection in her chest but states this does feel somewhat different.  States she has had previous stress test for similar pain.  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  States she was able to do the physical activity at work without any chest pain.  No nausea vomiting or diarrhea.  Is not feel short of breath.  No fevers or chills.  No coughing.  No swelling in her legs.  States she has fibromyalgia and she hurts all the time.  Patient states the pain is in her mid chest.  Does have some radiation to the right side.  No swelling in the right side which patient states she has had previously.  States she has had to get antibiotics for her in the past.    Past Medical History:  Diagnosis Date   Ankle edema BILATERAL   takes Lasix daily   Arthritis    BACK, KNEE, HIPS   Asthma    Cancer (HCC)    Cataracts, bilateral    immature   Chronic back pain    d/t injury in 1995   Chronic bronchitis    Chronic pain syndrome    COPD (chronic obstructive pulmonary disease) (HCC) CHRONIC BRONCHITIS --  LAST BOUT  BILATERAL PNEUMONIA 2009   uses QVAR daily and Albuterol daily as needed   Diabetes mellitus without complication (HCC)    takes Glipizide and Metformin daily   Diverticulosis    Fibromyalgia    Frequency of urination    GERD (gastroesophageal reflux disease)    takes Nexium daily    H/O hiatal hernia    Headache    frequently but related to sinus    Hepatitis    hx of.Not sure if A,B,C   History of blood transfusion    no abnormal reaction noted   History of colon polyps    benign   History of gastric ulcer    History of glaucoma STATES TREATED FOR ABOUT 5 YRS  AGO  --- NO ISSUES SINCE   Hyperlipidemia    was on meds but has been off x 2 months   Hypertension    takes Atenlol and Triamtere  daily    Iron deficiency anemia    takes Iron pill daily   Joint pain    Joint swelling    Nocturia    Peripheral neuropathy    takes Gabapentin daily   Pneumonia 2010   hx of   PTSD (post-traumatic stress disorder)    hx of-no meds   Seasonal allergies    takes Zyrtec daily and uses Flonase daily as needed   Stress incontinence, female    Urgency of urination    Vitamin D deficiency    takes Vit D daily    Patient Active Problem List   Diagnosis Date Noted   Claudication in peripheral vascular disease ([place]) 11/10/2020   Bilateral lower extremity  edema 11/10/2020   Thoracic aortic aneurysm without rupture 11/10/2020   Aortic atherosclerosis ([place]) 09/07/2020   Chest pain 01/18/2014   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    History of Bell's palsy    Pelvic pain in female    Stress incontinence, female    Nocturia    History of glaucoma    Uterine fibroid    Hyperlipidemia    COPD (chronic obstructive pulmonary disease) (HCC)    Iron deficiency anemia    H/O hiatal hernia    Fibromyalgia    Arthritis    Chronic renal insufficiency    Diabetes mellitus with coincident hypertension [facility])     Past Surgical History:  Procedure Laterality Date   ABDOMINAL HYSTERECTOMY     July 2013   BILATERAL BENIGN BREAST TUMOR REMOVED  YRS AGO   BREAST SURGERY     2 cyst removed from right breast and tumor removed from left breast   CARDIAC CATHETERIZATION  09-09-2008   NORMAL CORONARY ARTERIES/ NORMAL LVSF/ CHRONIC RENAL INSUFF.   COLONOSCOPY     CYSTO WITH HYDRODISTENSION  01/07/2012   Procedure: CYSTOSCOPY/HYDRODISTENSION;  Surgeon:  Reece Packer, MD;  Location: [facility];  Service: Urology;  Laterality: N/A;   OF BLADDER WITH INSTILLATION OF MARCAINE AND PRYDIUM   MUSCLE BIOPSY Left 10/04/2015   Procedure: LEFT Vastus Lateralis Muscle Biopsy;  Surgeon: Ashok Pall, MD;  Location: [place] [facility];  Service: Neurosurgery;  Laterality: Left;  LEFT vastus lateralis muscle biopsy   neck cyst     TUBAL LIGATION  YRS AGO     OB History     Gravida  5   Para  5   Term  5   Preterm      AB      Living  5      SAB      IAB      Ectopic      Multiple      Live Births              Family History  Problem Relation Age of Onset   Cancer Mother    Diabetes Mother    Hypertension Mother    Rheum arthritis Father    Cancer Sister    Hypertension Sister    Hypertension Son    Heart Problems Maternal Grandfather     Social History   Tobacco Use   Smoking status: Former    Packs/day: 1.00    Years: 27.00    Pack years: 27.00    Types: Cigarettes   Smokeless tobacco: Never   Tobacco comments:    quit smoking in 1993  Substance Use Topics   Alcohol use: No    Comment: quit drinking 25+yrs ago   Drug use: No    Comment: nothing in 25+yrs     Home Medications Prior to Admission medications   Medication Sig Start Date End Date Taking? Authorizing Provider  albuterol (PROVENTIL HFA;VENTOLIN HFA) 108 (90 BASE) MCG/ACT inhaler Inhale 2 puffs into the lungs every 6 (six) hours as needed for wheezing or shortness of breath. Shortness of breath    [provider]  aspirin-acetaminophen-caffeine (EXCEDRIN MIGRAINE) [phone] MG tablet Take 2 tablets by mouth every 6 (six) hours as needed for pain.    [provider]  atenolol (TENORMIN) 50 MG tablet Take 75 mg by mouth daily.    [provider]  beclomethasone (QVAR) 80 MCG/ACT inhaler Inhale into the  lungs.    [provider]  cetirizine (ZYRTEC) 10 MG tablet Take 10 mg by mouth daily.     [provider]  Cholecalciferol (VITAMIN D) 2000 UNITS CAPS Take 2,000 Units by mouth daily.    [provider]  clindamycin (CLEOCIN) 300 MG capsule Take 1 capsule (300 mg total) by mouth 3 (three) times daily. 01/25/21   Vanessa Kick, MD  esomeprazole (NEXIUM) 40 MG capsule Take 40 mg by mouth daily before breakfast.    [provider]  Ferrous Gluconate (IRON) 240 (27 FE) MG TABS Take 240 mg by mouth daily.     [provider]  fluticasone (FLONASE) 50 MCG/ACT nasal spray Place 2 sprays into the nose as needed for allergies. congestion    [provider]  gabapentin (NEURONTIN) 600 MG tablet Take 600 mg by mouth 3 (three) times daily.    [provider]  glipiZIDE (GLUCOTROL XL) 10 MG 24 hr tablet Take by mouth. Take 1 tablet (10mg ) by mouth when your blood sugar is over 200    [provider]  glucose blood (ACCU-CHEK AVIVA PLUS) test strip  08/30/16   [provider]  ibuprofen (ADVIL,MOTRIN) 600 MG tablet Take 600 mg by mouth every 6 (six) hours as needed for headache.    [provider]  lidocaine (XYLOCAINE) 2 % solution Use as directed 15 mLs in the mouth or throat as needed for mouth pain. 09/21/17   Glyn Ade, PA-C  metFORMIN (GLUCOPHAGE) 850 MG tablet Take 850 mg by mouth 2 (two) times daily with a meal.    [provider]  methocarbamol (ROBAXIN) 750 MG tablet Take 750 mg by mouth 3 (three) times daily. 06/13/20   [provider]  Multiple Vitamin (MULTIVITAMIN WITH MINERALS) TABS tablet Take 1 tablet by mouth daily.    [provider]  Omega-3 Fatty Acids (FISH OIL PO) Take 3 tablets by mouth daily.     [provider]  Oxycodone HCl 20 MG TABS Take 20 mg by mouth every 4 (four) hours. 09/12/17   [provider]  triamterene-hydrochlorothiazide (MAXZIDE-25) 37.5-25 MG per tablet Take 1 tablet by mouth daily.  01/13/14   [provider]  UNABLE TO  FIND Med Name: pt reports using estradol cream two times per day    [provider]  [facility] 160-4.5 MCG/ACT inhaler  04/04/20 11/21/20  [provider]    Allergies    Avapro [irbesartan], Lisinopril, Statins, and Latex  Review of Systems   Review of Systems  Constitutional:  Negative for appetite change.  HENT:  Negative for congestion.   Respiratory:  Negative for shortness of breath.   Cardiovascular:  Positive for chest pain.  Gastrointestinal:  Negative for abdominal pain.  Genitourinary:  Negative for flank pain.  Musculoskeletal:  Positive for myalgias. Negative for back pain.  Skin:  Negative for rash.  Neurological:  Negative for speech difficulty.  Psychiatric/Behavioral:  Negative for confusion.    Physical Exam Updated Vital Signs BP (!) 185/108 (BP Location: Right Arm)   Pulse 66   Temp 98.2 F (36.8 C) (Oral)   Resp 15   SpO2 100%   Physical Exam Vitals and nursing note reviewed.  HENT:     Head: Atraumatic.  Cardiovascular:     Rate and Rhythm: Normal rate and regular rhythm.  Pulmonary:     Breath sounds: No decreased breath sounds or wheezing.  Chest:     Chest wall: Tenderness present.  Abdominal:     Tenderness: There is no abdominal tenderness.  Musculoskeletal:     Cervical back: Neck supple.     Right lower leg: Tenderness present. No edema.     Left lower leg: Tenderness present. No edema.  Skin:    General: Skin is warm.     Capillary Refill: Capillary refill takes less than 2 seconds.  Neurological:     Mental Status: She is alert and oriented to person, place, and time.    ED Results / Procedures / Treatments   Labs (all labs ordered are listed, but only abnormal results are displayed) Labs Reviewed  COMPREHENSIVE METABOLIC PANEL - Abnormal; Notable for the following components:      Result Value   Glucose, Bld 106 (*)    All other components within normal limits  CBC WITH DIFFERENTIAL/PLATELET - Abnormal;  Notable for the following components:   Hemoglobin 11.3 (*)    All other components within normal limits  TROPONIN I (HIGH SENSITIVITY)  TROPONIN I (HIGH SENSITIVITY)    EKG EKG Interpretation  Date/Time:  Monday October 01 2021 12:06:43 EDT Ventricular Rate:  60 PR Interval:  140 QRS Duration: 82 QT Interval:  410 QTC Calculation: 410 R Axis:   35 Text Interpretation: Normal sinus rhythm Nonspecific T wave abnormality Abnormal ECG No significant change since last tracing Confirmed by Davonna Belling [phone]) on 10/01/2021 5:43:57 PM  Radiology DG Chest 2 View  Result Date: 10/01/2021 CLINICAL DATA:  Chest pain. EXAM: CHEST - 2 VIEW COMPARISON:  May 06, 2020. FINDINGS: Similar mild enlargement th chest pain. E cardiac silhouette. Similar tortuosity of the aorta. Similar mild prominence of the hilar structures bilaterally. No consolidation. No visible pleural effusions or pneumothorax. IMPRESSION: 1. No evidence of acute cardiopulmonary disease. 2. Similar mild cardiomegaly and mild prominence of the hila, potentially reflecting pulmonary vascular congestion. Electronically Signed   By: Margaretha Sheffield M.D.   On: 10/01/2021 13:32    Procedures Procedures   Medications Ordered in ED Medications - No data to display  ED Course  I have reviewed the triage vital signs and the nursing notes.  Pertinent labs & imaging results that were available during my care of the patient were reviewed by me and considered in my medical decision making (see chart for details).    MDM Rules/Calculators/A&P                           Patient presents with chest pain.  Anterior chest.  Had done some increasing physical activity the other day.  May be had strained some muscle for it.  EKG reassuring.  Troponin negative x2.  I think overall risk for cardiac cause is relatively low.  Doubt pulm embolism.  Doubt infection.  Chest x-ray reviewed and reassuring.  Will discharge home with outpatient  follow-up as needed.  Patient states she has pain medicines at home and muscle laxer Final Clinical Impression(s) / ED Diagnoses Final diagnoses:  None    Rx / DC Orders ED Discharge Orders     None        Davonna Belling, MD 10/01/21 1912

## 2021-10-01 NOTE — ED Notes (Signed)
Pt provided discharge instructions and prescription information. Pt was given the opportunity to ask questions and questions were answered. Discharge signature not obtained in the setting of the COVID-19 pandemic in order to reduce high touch surfaces.  ° °

## 2021-10-01 NOTE — ED Notes (Signed)
R sided chest wall pain that hurts with certain movements. Pt has been doing more lifting and pulling than usual. Pt denies ShOB, nausea, or diaphoresis. Pain is intermittent and exacerbated with R shoulder hyperextension

## 2021-10-01 NOTE — ED Triage Notes (Addendum)
Pt arrived by [facility] from home. Reports onset of right side chest pain that started this am while at rest, radiates to center of her chest. Pain increases with movement and breathing. No sob or n/v. Pt took 324mg  ASA at home.

## 2022-02-15 ENCOUNTER — Other Ambulatory Visit: Payer: Self-pay | Admitting: Family Medicine

## 2022-02-15 DIAGNOSIS — Z1231 Encounter for screening mammogram for malignant neoplasm of breast: Secondary | ICD-10-CM

## 2022-02-18 ENCOUNTER — Ambulatory Visit
Admission: RE | Admit: 2022-02-18 | Discharge: 2022-02-18 | Disposition: A | Discharge status: Home / Self Care | Payer: Medicare Other | Source: Ambulatory Visit | Attending: Family Medicine | Admitting: Family Medicine

## 2022-02-18 ENCOUNTER — Other Ambulatory Visit: Payer: Self-pay

## 2022-02-18 DIAGNOSIS — Z1231 Encounter for screening mammogram for malignant neoplasm of breast: Secondary | ICD-10-CM

## 2022-02-20 ENCOUNTER — Other Ambulatory Visit: Payer: Self-pay | Admitting: Family Medicine

## 2022-02-20 DIAGNOSIS — R928 Other abnormal and inconclusive findings on diagnostic imaging of breast: Secondary | ICD-10-CM

## 2022-03-03 ENCOUNTER — Other Ambulatory Visit: Payer: Self-pay

## 2022-03-03 ENCOUNTER — Encounter (HOSPITAL_COMMUNITY): Payer: Self-pay

## 2022-03-03 ENCOUNTER — Emergency Department (HOSPITAL_COMMUNITY): Payer: Medicare Other

## 2022-03-03 ENCOUNTER — Emergency Department (HOSPITAL_COMMUNITY)
Admission: EM | Admit: 2022-03-03 | Discharge: 2022-03-03 | Disposition: A | Discharge status: Home / Self Care | Payer: Medicare Other | Attending: Emergency Medicine | Admitting: Emergency Medicine

## 2022-03-03 DIAGNOSIS — E119 Type 2 diabetes mellitus without complications: Secondary | ICD-10-CM | POA: Diagnosis not present

## 2022-03-03 DIAGNOSIS — Z91040 Latex allergy status: Secondary | ICD-10-CM | POA: Diagnosis not present

## 2022-03-03 DIAGNOSIS — Z79899 Other long term (current) drug therapy: Secondary | ICD-10-CM | POA: Diagnosis not present

## 2022-03-03 DIAGNOSIS — J449 Chronic obstructive pulmonary disease, unspecified: Secondary | ICD-10-CM | POA: Diagnosis not present

## 2022-03-03 DIAGNOSIS — I517 Cardiomegaly: Secondary | ICD-10-CM | POA: Diagnosis not present

## 2022-03-03 DIAGNOSIS — Z7982 Long term (current) use of aspirin: Secondary | ICD-10-CM | POA: Diagnosis not present

## 2022-03-03 DIAGNOSIS — I10 Essential (primary) hypertension: Secondary | ICD-10-CM | POA: Diagnosis not present

## 2022-03-03 DIAGNOSIS — Z7984 Long term (current) use of oral hypoglycemic drugs: Secondary | ICD-10-CM | POA: Diagnosis not present

## 2022-03-03 DIAGNOSIS — R079 Chest pain, unspecified: Secondary | ICD-10-CM | POA: Diagnosis present

## 2022-03-03 DIAGNOSIS — R059 Cough, unspecified: Secondary | ICD-10-CM | POA: Insufficient documentation

## 2022-03-03 DIAGNOSIS — R072 Precordial pain: Secondary | ICD-10-CM | POA: Insufficient documentation

## 2022-03-03 LAB — CBC WITH DIFFERENTIAL/PLATELET
Abs Immature Granulocytes: 0.01 10*3/uL (ref 0.00–0.07)
Basophils Absolute: 0 10*3/uL (ref 0.0–0.1)
Basophils Relative: 1 %
Eosinophils Absolute: 0.1 10*3/uL (ref 0.0–0.5)
Eosinophils Relative: 2 %
HCT: 34.1 % — ABNORMAL LOW (ref 36.0–46.0)
Hemoglobin: 10.9 g/dL — ABNORMAL LOW (ref 12.0–15.0)
Immature Granulocytes: 0 %
Lymphocytes Relative: 39 %
Lymphs Abs: 1.9 10*3/uL (ref 0.7–4.0)
MCH: 28.6 pg (ref 26.0–34.0)
MCHC: 32 g/dL (ref 30.0–36.0)
MCV: 89.5 fL (ref 80.0–100.0)
Monocytes Absolute: 0.5 10*3/uL (ref 0.1–1.0)
Monocytes Relative: 10 %
Neutro Abs: 2.3 10*3/uL (ref 1.7–7.7)
Neutrophils Relative %: 48 %
Platelets: 178 10*3/uL (ref 150–400)
RBC: 3.81 MIL/uL — ABNORMAL LOW (ref 3.87–5.11)
RDW: 13.6 % (ref 11.5–15.5)
WBC: 4.8 10*3/uL (ref 4.0–10.5)
nRBC: 0 % (ref 0.0–0.2)

## 2022-03-03 LAB — COMPREHENSIVE METABOLIC PANEL
ALT: 28 U/L (ref 0–44)
AST: 30 U/L (ref 15–41)
Albumin: 3.5 g/dL (ref 3.5–5.0)
Alkaline Phosphatase: 67 U/L (ref 38–126)
Anion gap: 8 (ref 5–15)
BUN: 20 mg/dL (ref 8–23)
CO2: 29 mmol/L (ref 22–32)
Calcium: 9.1 mg/dL (ref 8.9–10.3)
Chloride: 102 mmol/L (ref 98–111)
Creatinine, Ser: 0.82 mg/dL (ref 0.44–1.00)
GFR, Estimated: >60 mL/min (ref >60)
Glucose, Bld: 98 mg/dL (ref 70–99)
Potassium: 3.5 mmol/L (ref 3.5–5.1)
Sodium: 139 mmol/L (ref 135–145)
Total Bilirubin: 0.4 mg/dL (ref 0.3–1.2)
Total Protein: 6.7 g/dL (ref 6.5–8.1)

## 2022-03-03 LAB — TROPONIN I (HIGH SENSITIVITY)
Troponin I (High Sensitivity): 5 ng/L (ref <18)
Troponin I (High Sensitivity): 5 ng/L (ref <18)

## 2022-03-03 LAB — D-DIMER, QUANTITATIVE: D-Dimer, Quant: 0.67 ug/mL-FEU — ABNORMAL HIGH (ref 0.00–0.50)

## 2022-03-03 MED ORDER — NITROGLYCERIN 0.4 MG SL SUBL: 0.4000 mg | SUBLINGUAL_TABLET | SUBLINGUAL | Status: DC | PRN

## 2022-03-03 MED ORDER — ASPIRIN 81 MG PO CHEW
324.0000 mg | CHEWABLE_TABLET | Freq: Once | ORAL | Status: AC
  Administered 2022-03-03: 324 mg via ORAL
  Filled 2022-03-03: qty 4

## 2022-03-03 NOTE — ED Provider Notes (Cosign Needed)
[place] EMERGENCY DEPARTMENT Provider Note   CSN: [id number] Arrival date & time: 03/03/22  1656     History  Chief Complaint  Patient presents with   Chest Pain    Patient having right sided chest pain since yesterday. Patient does have hx of sarcoidosis and HTN.     LASHYA PASSE is a 71 y.o. female with a past medical history of COPD, diabetes mellitus, hypertension, hyperlipidemia.  Presents emergency department with a chief complaint of chest pain.  Patient reports that she is started having intermittent chest pain earlier this week.  Pain is located to the right side of her chest and she described as sharp.  This pain lasted for a few seconds before resolving.  Patient states that pain became constant yesterday evening at approximately 9:30 PM.  Pain is located to the right side of her chest and with radiates to mid sternum.  Patient describes pain as sharp.  Notices that pain is worse with deep inspiration and when walking.  Patient tried taking Excedrin earlier today with no improvement in her symptoms.  Patient reports that she has baseline swelling to bilateral lower extremities however states that this has been improved recently.  Additionally patient endorses productive cough.  States cough is producing similar amount of mucus to baseline.  Patient noticed 1 day of change in sputum color however has returned back to clear.  Patient denies any associated nausea, vomiting, diaphoresis, shortness of breath, palpitations, abdominal pain, lightheadedness, syncope, history of malignancy, hemoptysis, history of DVT/PE, surgery in the last 4 weeks, cigarette smoking in the last 90 days.   Chest Pain Associated symptoms: cough   Associated symptoms: no abdominal pain, no back pain, no diaphoresis, no dizziness, no fever, no headache, no nausea, no palpitations, no shortness of breath and no vomiting       Home Medications Prior to Admission medications    Medication Sig Start Date End Date Taking? Authorizing Provider  albuterol (PROVENTIL HFA;VENTOLIN HFA) 108 (90 BASE) MCG/ACT inhaler Inhale 2 puffs into the lungs every 6 (six) hours as needed for wheezing or shortness of breath. Shortness of breath    [provider]  aspirin-acetaminophen-caffeine (EXCEDRIN MIGRAINE) [phone] MG tablet Take 2 tablets by mouth every 6 (six) hours as needed for pain.    [provider]  atenolol (TENORMIN) 50 MG tablet Take 75 mg by mouth daily.    [provider]  beclomethasone (QVAR) 80 MCG/ACT inhaler Inhale into the lungs.    [provider]  cetirizine (ZYRTEC) 10 MG tablet Take 10 mg by mouth daily.    [provider]  Cholecalciferol (VITAMIN D) 2000 UNITS CAPS Take 2,000 Units by mouth daily.    [provider]  clindamycin (CLEOCIN) 300 MG capsule Take 1 capsule (300 mg total) by mouth 3 (three) times daily. 01/25/21   Vanessa Kick, MD  esomeprazole (NEXIUM) 40 MG capsule Take 40 mg by mouth daily before breakfast.    [provider]  Ferrous Gluconate (IRON) 240 (27 FE) MG TABS Take 240 mg by mouth daily.     [provider]  fluticasone (FLONASE) 50 MCG/ACT nasal spray Place 2 sprays into the nose as needed for allergies. congestion    [provider]  gabapentin (NEURONTIN) 600 MG tablet Take 600 mg by mouth 3 (three) times daily.    [provider]  glipiZIDE (GLUCOTROL XL) 10 MG 24 hr tablet Take by mouth. Take 1 tablet ('10mg'$ ) by  mouth when your blood sugar is over 200    [provider]  glucose blood (ACCU-CHEK AVIVA PLUS) test strip  08/30/16   [provider]  ibuprofen (ADVIL,MOTRIN) 600 MG tablet Take 600 mg by mouth every 6 (six) hours as needed for headache.    [provider]  lidocaine (XYLOCAINE) 2 % solution Use as directed 15 mLs in the mouth or throat as needed for mouth pain. 09/21/17   Glyn Ade, PA-C  metFORMIN  (GLUCOPHAGE) 850 MG tablet Take 850 mg by mouth 2 (two) times daily with a meal.    [provider]  methocarbamol (ROBAXIN) 750 MG tablet Take 750 mg by mouth 3 (three) times daily. 06/13/20   [provider]  Multiple Vitamin (MULTIVITAMIN WITH MINERALS) TABS tablet Take 1 tablet by mouth daily.    [provider]  Omega-3 Fatty Acids (FISH OIL PO) Take 3 tablets by mouth daily.     [provider]  Oxycodone HCl 20 MG TABS Take 20 mg by mouth every 4 (four) hours. 09/12/17   [provider]  triamterene-hydrochlorothiazide (MAXZIDE-25) 37.5-25 MG per tablet Take 1 tablet by mouth daily.  01/13/14   [provider]  UNABLE TO FIND Med Name: pt reports using estradol cream two times per day    [provider]  [facility] 160-4.5 MCG/ACT inhaler  04/04/20 11/21/20  [provider]      Allergies    Avapro [irbesartan], Lisinopril, Statins, and Latex    Review of Systems   Review of Systems  Constitutional:  Negative for chills, diaphoresis and fever.  Eyes:  Negative for visual disturbance.  Respiratory:  Positive for cough. Negative for shortness of breath.   Cardiovascular:  Positive for chest pain and leg swelling. Negative for palpitations.  Gastrointestinal:  Negative for abdominal pain, nausea and vomiting.  Genitourinary:  Negative for difficulty urinating and dysuria.  Musculoskeletal:  Negative for back pain and neck pain.  Skin:  Negative for color change and rash.  Neurological:  Negative for dizziness, syncope, light-headedness and headaches.  Psychiatric/Behavioral:  Negative for confusion.    Physical Exam Updated Vital Signs BP (!) 160/104 (BP Location: Right Arm)    Pulse 74    Resp 16    SpO2 100%  Physical Exam Vitals and nursing note reviewed.  Constitutional:      General: She is not in acute distress.    Appearance: She is not ill-appearing, toxic-appearing or diaphoretic.  HENT:     Head:  Normocephalic.  Eyes:     General: No scleral icterus.       Right eye: No discharge.        Left eye: No discharge.  Cardiovascular:     Rate and Rhythm: Normal rate.     Pulses:          Radial pulses are 2+ on the right side and 2+ on the left side.     Comments: Trace edema to bilateral lower extremities Pulmonary:     Effort: Pulmonary effort is normal. No tachypnea, bradypnea or respiratory distress.     Breath sounds: Normal breath sounds. No stridor.     Comments: Speaks in full complete sentences without difficulty Chest:     Chest wall: Tenderness present. No lacerations, deformity, swelling or crepitus.     Comments: Tenderness to right sided chest wall and sternum Abdominal:     General: There is no distension. There are no signs of injury.  Palpations: Abdomen is soft. There is no mass or pulsatile mass.     Tenderness: There is no abdominal tenderness.  Musculoskeletal:     Right lower leg: Edema present.     Left lower leg: Edema present.  Skin:    General: Skin is warm and dry.  Neurological:     General: No focal deficit present.     Mental Status: She is alert.  Psychiatric:        Behavior: Behavior is cooperative.    ED Results / Procedures / Treatments   Labs (all labs ordered are listed, but only abnormal results are displayed) Labs Reviewed  CBC WITH DIFFERENTIAL/PLATELET - Abnormal; Notable for the following components:      Result Value   RBC 3.81 (*)    Hemoglobin 10.9 (*)    HCT 34.1 (*)    All other components within normal limits  D-DIMER, QUANTITATIVE - Abnormal; Notable for the following components:   D-Dimer, Quant 0.67 (*)    All other components within normal limits  COMPREHENSIVE METABOLIC PANEL  TROPONIN I (HIGH SENSITIVITY)  TROPONIN I (HIGH SENSITIVITY)    EKG EKG Interpretation  Date/Time:  Sunday March 03 2022 17:02:43 EDT Ventricular Rate:  72 PR Interval:  143 QRS Duration: 92 QT Interval:  388 QTC  Calculation: 425 R Axis:   51 Text Interpretation: Sinus rhythm Borderline T abnormalities, anterior leads No significant change since last tracing Confirmed by Pattricia Boss [phone]) on 03/03/2022 7:28:37 PM  Radiology DG Chest Portable 1 View  Result Date: 03/03/2022 CLINICAL DATA:  Chest pain EXAM: PORTABLE CHEST 1 VIEW COMPARISON:  10/01/2021 FINDINGS: Patient rotated right. Mild cardiomegaly. No pleural effusion or pneumothorax. No congestive failure. IMPRESSION: Cardiomegaly, without congestive failure or acute disease. Electronically Signed   By: Abigail Miyamoto M.D.   On: 03/03/2022 18:03    Procedures Procedures    Medications Ordered in ED Medications  aspirin chewable tablet 324 mg (has no administration in time range)    ED Course/ Medical Decision Making/ A&P                           Medical Decision Making Amount and/or Complexity of Data Reviewed Labs: ordered. Radiology: ordered.  Risk OTC drugs. Prescription drug management.   Alert 71 year old female in no acute distress, nontoxic-appearing.  Presents emergency department with a chief complaint of chest pain.  Information is obtained from patient.  Past medical records including previous provider notes, managing, and labs were reviewed.  Patient has medical history as outlined in HPI which complicates her care.  Due to patient's report of chest pain differential diagnosis includes but is not limited to skill skeletal pain, ACS, PE, pneumothorax.  Will initiate ACS work-up at this time.  Patient ordered 324 mg of aspirin.  EKG was independently reviewed interpreted by myself.  Tracing shows sinus rhythm with borderline T wave abnormalities in anterior leads.  Chest x-ray was independently viewed and interpreted by myself.  I agree with radiology interpretation of cardiomegaly with no acute cardiopulmonary disease.  Labs were reviewed and independently interpreted by myself.  Pertinent findings include: -D-dimer  0.67.  Within normal limits when age-adjusted -Troponin 5 and 5 with delta of 0 -Hemoglobin of 10.9, appears stable for patient. -CMP is unremarkable  Patient reports improvement in her chest pain.  Is able to stand and ambulate without difficulty.  During 1 instance of reevaluation patient is in the bathroom applying cosmetics.  Admission was considered for acute ACS however with flat troponin low suspicion for ACS at this time.  Obtaining CTA chest to evaluate for PE was considered however with D-dimer within normal limits suspicion for PE at this time.  Patient is hemodynamically stable at this time.  Will discharge to follow-up closely with PCP and cardiology.  Discussed results, findings, treatment and follow up. Patient advised of return precautions. Patient verbalized understanding and agreed with plan.  Patient was discussed with and evaluated by Dr. Jeanell Sparrow.         Final Clinical Impression(s) / ED Diagnoses Final diagnoses:  Precordial chest pain    Rx / DC Orders ED Discharge Orders     None         Loni Beckwith, PA-C 03/04/22 0138

## 2022-03-03 NOTE — ED Notes (Signed)
All discharge instructions including follow up care reviewed with patient and patient verbalized understanding of same. Patient stable and ambulatory at time of discharge.  

## 2022-03-03 NOTE — Discharge Instructions (Signed)
You came to the emergency department today to be evaluated for your chest pain.  Your exam, EKG, chest x-ray, and lab work were reassuring that you are not having acute heart attack or blood clot in your lungs today.  Please continue to take your pain medication as prescribed.  Additionally have given you prescription for lidocaine patches.   ? ?Please follow-up closely with your primary care provider as well as your cardiologist. ? ?Get help right away if: ?Your chest pain is worse. ?You have a cough that gets worse, or you cough up blood. ?You have very bad (severe) pain in your belly (abdomen). ?You pass out (faint). ?You have either of these for no clear reason: ?Sudden chest discomfort. ?Sudden discomfort in your arms, back, neck, or jaw. ?You have shortness of breath at any time. ?You suddenly start to sweat, or your skin gets clammy. ?You feel sick to your stomach (nauseous). ?You throw up (vomit). ?You suddenly feel lightheaded or dizzy. ?You feel very weak or tired. ?Your heart starts to beat fast, or it feels like it is skipping beats. ?

## 2022-03-21 ENCOUNTER — Ambulatory Visit: Payer: Medicare Other | Admitting: Internal Medicine

## 2022-03-21 NOTE — Progress Notes (Deleted)
?Cardiology Office Note:   ? ?Date:  03/21/2022  ? ?ID:  [name], DOB 04-17-1951, MRN [id number] ? ?PCP:  Roselee Nova, MD  ?[facility] Cardiologist:  Werner Lean, MD  ?[facility] Electrophysiologist:  None  ? ?Referring MD: Roselee Nova, MD  ?Follow up LE Edema ? ?History of Present Illness:   ? ?[name] is a 71 y.o. female with a hx of Diabetes with Hypertension; Angioedema with ACEi (lisinopril), COPD, HLD & Aortic Atherosclerosis, History of Ascending Aortic Dilation (last 4.1 cm), and Sarcoidosis (uveitis). History of prior PVCs. Seen 09/07/20.  In interim, had 9/21 ED visit for out of work paperwork.  As part of last eval, BNP WNL, Echo with normal IVC, LV and RV function, no significant valve abnormality, and stable AA (4.0 cm); CT scan with stable AA.   ? ?Patient notes that she is having leg pain and cramps.  She has had swelling since the last time we talked.  Swelling has improved with compression stockings.  The leg pain is worse with exertion.  Patient did not have significant improvement in the past with lasix.  Leg pain is worse with exertion,.  Has cramps.  No cold feeling. ? ?Ambulatory blood pressure 120/80. ? ?Past Medical History:  ?Diagnosis Date  ? Ankle edema BILATERAL  ? takes Lasix daily  ? Arthritis   ? BACK, KNEE, HIPS  ? Asthma   ? Cancer [facility])   ? Cataracts, bilateral   ? immature  ? Chronic back pain   ? d/t injury in 1995  ? Chronic bronchitis   ? Chronic pain syndrome   ? COPD (chronic obstructive pulmonary disease) (HCC) CHRONIC BRONCHITIS --  LAST BOUT  BILATERAL PNEUMONIA 2009  ? uses QVAR daily and Albuterol daily as needed  ? Diabetes mellitus without complication ([place])   ? takes Glipizide and Metformin daily  ? Diverticulosis   ? Fibromyalgia   ? Frequency of urination   ? GERD (gastroesophageal reflux disease)   ? takes Nexium daily   ? H/O hiatal hernia   ? Headache   ? frequently but related to sinus  ? Hepatitis   ? hx of.Not sure if  A,B,C  ? History of blood transfusion   ? no abnormal reaction noted  ? History of colon polyps   ? benign  ? History of gastric ulcer   ? History of glaucoma STATES TREATED FOR ABOUT 5 YRS  AGO  --- NO ISSUES SINCE  ? Hyperlipidemia   ? was on meds but has been off x 2 months  ? Hypertension   ? takes Atenlol and Triamtere  daily   ? Iron deficiency anemia   ? takes Iron pill daily  ? Joint pain   ? Joint swelling   ? Nocturia   ? Peripheral neuropathy   ? takes Gabapentin daily  ? Pneumonia 2010  ? hx of  ? PTSD (post-traumatic stress disorder)   ? hx of-no meds  ? Seasonal allergies   ? takes Zyrtec daily and uses Flonase daily as needed  ? Stress incontinence, female   ? Urgency of urination   ? Vitamin D deficiency   ? takes Vit D daily  ? ? ?Past Surgical History:  ?Procedure Laterality Date  ? ABDOMINAL HYSTERECTOMY    ? July 2013  ? BILATERAL BENIGN BREAST TUMOR REMOVED  YRS AGO  ? BREAST SURGERY    ? 2 cyst removed from right breast  and tumor removed from left breast  ? CARDIAC CATHETERIZATION  09-09-2008  ? NORMAL CORONARY ARTERIES/ NORMAL LVSF/ CHRONIC RENAL INSUFF.  ? COLONOSCOPY    ? CYSTO WITH HYDRODISTENSION  01/07/2012  ? Procedure: CYSTOSCOPY/HYDRODISTENSION;  Surgeon: Reece Packer, MD;  Location: [facility];  Service: Urology;  Laterality: N/A;   OF BLADDER WITH INSTILLATION OF MARCAINE AND PRYDIUM  ? MUSCLE BIOPSY Left 10/04/2015  ? Procedure: LEFT Vastus Lateralis Muscle Biopsy;  Surgeon: Ashok Pall, MD;  Location: [place] [facility];  Service: Neurosurgery;  Laterality: Left;  LEFT vastus lateralis muscle biopsy  ? neck cyst    ? TUBAL LIGATION  YRS AGO  ? ?Current Medications: ?No outpatient medications have been marked as taking for the 03/21/22 encounter (Appointment) with Werner Lean, MD.  ?  ?Allergies:   Avapro [irbesartan], Lisinopril, Statins, and Latex  ? ?Social History  ? ?Socioeconomic History  ? Marital status: Married  ?  Spouse name: Christia Reading  ? Number  of children: 5  ? Years of education: 13-14  ? Highest education level: Not on file  ?Occupational History  ?  Comment: CME, lab tech  ?Tobacco Use  ? Smoking status: Former  ?  Packs/day: 1.00  ?  Years: 27.00  ?  Pack years: 27.00  ?  Types: Cigarettes  ? Smokeless tobacco: Never  ? Tobacco comments:  ?  quit smoking in 1993  ?Substance and Sexual Activity  ? Alcohol use: No  ?  Comment: quit drinking 25+yrs ago  ? Drug use: No  ?  Comment: nothing in 25+yrs   ? Sexual activity: Not Currently  ?  Birth control/protection: Surgical, None  ?Other Topics Concern  ? Not on file  ?Social History Narrative  ? Lives with 2 grandchildren 1 stepson, husband  ? caffeine use - coffee, tea 1-2 c daily  ? ?Social Determinants of Health  ? ?Financial Resource Strain: Not on file  ?Food Insecurity: Not on file  ?Transportation Needs: Not on file  ?Physical Activity: Not on file  ?Stress: Not on file  ?Social Connections: Not on file  ? ?Family History: ?The patient's family history includes Cancer in her mother and sister; Diabetes in her mother; Heart Problems in her maternal grandfather; Hypertension in her mother, sister, and son; Rheum arthritis in her father.  ?Sister had MI at age 64. ? ?ROS:   ?Please see the history of present illness.    ?Notes chronic back pain. ?All other systems reviewed and are negative. ? ?EKGs/Labs/Other Studies Reviewed:   ? ?The following studies were reviewed today: ? ?EKG:   ?09/07/20 Sinus rhythm rate 64, occasional PVCs,  ?09/18/18:  Sinus rhythm rate 61, normal axis no ST/T changes ? ?2006 Echo Report Summary Only: ?Normal biventricular function without noted AI or Aortic Dilation. ?02/10/14: Normal Perfusion Imaging per report and static imaging ? ?09/21/20 Echo ?IMPRESSIONS  ? ? ? 1. Left ventricular ejection fraction, by estimation, is 60 to 65%. The  ?left ventricle has normal function. The left ventricle has no regional  ?wall motion abnormalities. There is mild left ventricular  hypertrophy.  ?Left ventricular diastolic parameters  ?are consistent with Grade II diastolic dysfunction (pseudonormalization).  ? 2. Right ventricular systolic function is normal. The right ventricular  ?size is mildly enlarged. There is mildly elevated pulmonary artery  ?systolic pressure. The estimated right ventricular systolic pressure is  ?37.9 mmHg.  ? 3. Left atrial size was moderately dilated.  ? 4. Right atrial size  was mildly dilated.  ? 5. The mitral valve is normal in structure. Trivial mitral valve  ?regurgitation. No evidence of mitral stenosis.  ? 6. Tricuspid valve regurgitation is moderate.  ? 7. The aortic valve is tricuspid. Aortic valve regurgitation is not  ?visualized. Mild aortic valve sclerosis is present, with no evidence of  ?aortic valve stenosis.  ? 8. Aortic dilatation noted. There is mild dilatation of the ascending  ?aorta, measuring 40 mm.  ? 9. The inferior vena cava is normal in size with greater than 50%  ?respiratory variability, suggesting right atrial pressure of 3 mmHg.  ? ?Recent Labs: ?03/03/2022: ALT 28; BUN 20; Creatinine, Ser 0.82; Hemoglobin 10.9; Platelets 178; Potassium 3.5; Sodium 139  ?Recent Lipid Panel ?   ?Component Value Date/Time  ? CHOL 202 (H) 03/01/2010 2025  ? TRIG 71 03/01/2010 2025  ? HDL 103 03/01/2010 2025  ? CHOLHDL 2.0 Ratio 03/01/2010 2025  ? VLDL 14 03/01/2010 2025  ? [place] 85 03/01/2010 2025  ? LDLDIRECT 68 09/27/2010 2034  ? ? ?Physical Exam:   ? ?VS:  There were no vitals taken for this visit.   ? ?Wt Readings from Last 3 Encounters:  ?03/03/22 238 lb 1.6 oz (108 kg)  ?11/10/20 238 lb (108 kg)  ?09/12/20 243 lb (110.2 kg)  ?  ? ?GEN: Well nourished, well developed in no acute distress ?HEENT: Normal ?NECK: No JVD; No carotid bruits ?LYMPHATICS: No lymphadenopathy ?CARDIAC: RRR, no murmurs, rubs, gallops ?RESPIRATORY:  Clear to auscultation without rales, wheezing or rhonchi  ?ABDOMEN: Soft, non-tender, non-distended ?MUSCULOSKELETAL:  +2 edema  bilaterally and warm; No deformity  ?SKIN: Warm and dry, skin is shiny with little to no hair ?NEUROLOGIC:  Alert and oriented x 3 ?PSYCHIATRIC:  Normal affect  ? ?ASSESSMENT:   ? ?No diagnosis found. ? ?PLAN:   ?

## 2022-04-20 ENCOUNTER — Ambulatory Visit: Payer: Medicare Other

## 2022-04-20 ENCOUNTER — Ambulatory Visit: Admission: RE | Admit: 2022-04-20 | Payer: Medicare Other | Source: Ambulatory Visit

## 2022-05-06 ENCOUNTER — Ambulatory Visit: Payer: Medicare Other | Admitting: Internal Medicine

## 2022-05-09 ENCOUNTER — Ambulatory Visit: Payer: Medicare Other | Admitting: Internal Medicine

## 2022-05-09 ENCOUNTER — Encounter: Payer: Self-pay | Admitting: Internal Medicine

## 2022-05-09 VITALS — BP 140/80 | HR 79 | Ht 69.0 in | Wt 246.0 lb

## 2022-05-09 DIAGNOSIS — I700 Atherosclerosis of aorta: Secondary | ICD-10-CM

## 2022-05-09 DIAGNOSIS — E1169 Type 2 diabetes mellitus with other specified complication: Secondary | ICD-10-CM

## 2022-05-09 DIAGNOSIS — R072 Precordial pain: Secondary | ICD-10-CM | POA: Diagnosis not present

## 2022-05-09 DIAGNOSIS — R600 Localized edema: Secondary | ICD-10-CM

## 2022-05-09 DIAGNOSIS — I712 Thoracic aortic aneurysm, without rupture, unspecified: Secondary | ICD-10-CM

## 2022-05-09 DIAGNOSIS — E785 Hyperlipidemia, unspecified: Secondary | ICD-10-CM

## 2022-05-09 MED ORDER — METOPROLOL TARTRATE 100 MG PO TABS: ORAL_TABLET | ORAL | 0 refills | Status: DC

## 2022-05-09 NOTE — Patient Instructions (Addendum)
Medication Instructions:  Your physician recommends that you continue on your current medications as directed. Please refer to the Current Medication list given to you today.  *If you need a refill on your cardiac medications before your next appointment, please call your pharmacy*   Lab Work: TODAY: BMP, BNP, Lipid panel If you have labs (blood work) drawn today and your tests are completely normal, you will receive your results only by: [place] (if you have MyChart) OR A paper copy in the mail If you have any lab test that is abnormal or we need to change your treatment, we will call you to review the results.   Testing/Procedures: Your physician has requested that you have cardiac CT. Cardiac computed tomography (CT) is a painless test that uses an x-ray machine to take clear, detailed pictures of your heart. For further information please visit [email or web address].[email or web address]. Please follow instruction sheet as given.  Your physician has requested that you have a CT Aorta.   Your physician has requested that you have an echocardiogram. Echocardiography is a painless test that uses sound waves to create images of your heart. It provides your doctor with information about the size and shape of your heart and how well your heart's chambers and valves are working. This procedure takes approximately one hour. There are no restrictions for this procedure.    Follow-Up: At [facility], you and your health needs are our priority.  As part of our continuing mission to provide you with exceptional heart care, we have created designated Provider Care Teams.  These Care Teams include your primary Cardiologist (physician) and Advanced Practice Providers (APPs -  Physician Assistants and Nurse Practitioners) who all work together to provide you with the care you need, when you need it.  We recommend signing up for the patient portal called "MyChart".  Sign up information is provided on this After Visit  Summary.  MyChart is used to connect with patients for Virtual Visits (Telemedicine).  Patients are able to view lab/test results, encounter notes, upcoming appointments, etc.  Non-urgent messages can be sent to your provider as well.   To learn more about what you can do with MyChart, go to [email or web address].    Your next appointment:   6 month(s)  The format for your next appointment:   In Person  Provider:   Werner Lean, MD  or Melina Copa, PA-C, Ermalinda Barrios, PA-C, or Christen Bame, NP         Other Instructions   Your cardiac CT will be scheduled at the below location:   [facility] [address] [place], [place] [address] [phone]   At [facility], please arrive at the [facility] and Children's Entrance (Entrance C2) of [facility] 30 minutes prior to test start time. You can use the FREE valet parking offered at entrance C (encouraged to control the heart rate for the test)  Proceed to the [facility] Radiology Department (first floor) to check-in and test prep.  All radiology patients and guests should use entrance C2 at [facility], accessed from [facility], even though the hospital's physical address listed is [address]     Please follow these instructions carefully (unless otherwise directed):  On the Night Before the Test: Be sure to Drink plenty of water. Do not consume any caffeinated/decaffeinated beverages or chocolate 12 hours prior to your test. Do not take any antihistamines 12 hours prior to your test.  DO NOT TAKE: cetirizine (Zyrtec) or diphenhydramine (Benadryl)  On the Day of the Test: Drink plenty of water until 1 hour prior to the test. Do not eat any food 4 hours prior to the test. You may take your regular medications prior to the test.  Take metoprolol (Lopressor) 100 MG by mouth two hours prior to test. HOLD Furosemide/Hydrochlorothiazide morning of the  test.   DO NOT TAKE: TRIAMETERENE-HYDROCHLOROTHIAZIDE(MAXZIDE) FEMALES- please wear underwire-free bra if available, avoid dresses & tight clothing          After the Test: Drink plenty of water. After receiving IV contrast, you may experience a mild flushed feeling. This is normal. On occasion, you may experience a mild rash up to 24 hours after the test. This is not dangerous. If this occurs, you can take Benadryl 25 mg and increase your fluid intake. If you experience trouble breathing, this can be serious. If it is severe call 911 IMMEDIATELY. If it is mild, please call our office. If you take any of these medications: Glipizide/Metformin, Avandament, Glucavance, please do not take 48 hours after completing test unless otherwise instructed.  We will call to schedule your test 2-4 weeks out understanding that some insurance companies will need an authorization prior to the service being performed.   For non-scheduling related questions, please contact the cardiac imaging nurse navigator should you have any questions/concerns: Marchia Bond, Cardiac Imaging Nurse Navigator Gordy Clement, Cardiac Imaging Nurse Navigator [facility] Direct Office Dial: [phone]   For scheduling needs, including cancellations and rescheduling, please call [place], [phone].   Important Information About Sugar

## 2022-05-09 NOTE — Progress Notes (Signed)
Cardiology Office Note:    Date:  05/09/2022   ID:  [name], DOB December 02, 1951, MRN [id number]  PCP:  Roselee Nova, MD  [facility] Cardiologist:  Werner Lean, MD  [place] Electrophysiologist:  None    Referring MD: Roselee Nova, MD   DOD:  CP and SOB  History of Present Illness:    [name] is a 71 y.o. female with a hx of Diabetes with Hypertension; Angioedema with ACEi (lisinopril), COPD, HLD & Aortic Atherosclerosis, History of Ascending Aortic Dilation (last 4.1 cm), and Sarcoidosis (uveitis). Established with me in 2021: had PCP paperwork for being out of work.  BNP WNL, stable Ascending aorta. Lost to follow up. 6 ED visits since our last f/u.  Three for chest pain.  Patient notes that she is having chronic pain. Continued to work which has kept her from returning. Notes that she is having chronic hip pain    She has been having new chest pain.  Occurs are rest.  There is not an exertional component.  She has had cysts in the past and thinks this is related.  She massages it and this makes it better.   No SOB/DOE and no PND/Orthopnea.  With allergy season need Qvar every day.  Rare emergency inhaler.  palpitations or syncope . Still has some swelling despite compression stockings   Past Medical History:  Diagnosis Date   Ankle edema BILATERAL   takes Lasix daily   Arthritis    BACK, KNEE, HIPS   Asthma    Cancer (HCC)    Cataracts, bilateral    immature   Chronic back pain    d/t injury in 1995   Chronic bronchitis    Chronic pain syndrome    COPD (chronic obstructive pulmonary disease) (HCC) CHRONIC BRONCHITIS --  LAST BOUT  BILATERAL PNEUMONIA 2009   uses QVAR daily and Albuterol daily as needed   Diabetes mellitus without complication (HCC)    takes Glipizide and Metformin daily   Diverticulosis    Fibromyalgia    Frequency of urination    GERD (gastroesophageal reflux disease)    takes Nexium daily    H/O hiatal  hernia    Headache    frequently but related to sinus   Hepatitis    hx of.Not sure if A,B,C   History of blood transfusion    no abnormal reaction noted   History of colon polyps    benign   History of gastric ulcer    History of glaucoma STATES TREATED FOR ABOUT 5 YRS  AGO  --- NO ISSUES SINCE   Hyperlipidemia    was on meds but has been off x 2 months   Hypertension    takes Atenlol and Triamtere  daily    Iron deficiency anemia    takes Iron pill daily   Joint pain    Joint swelling    Nocturia    Peripheral neuropathy    takes Gabapentin daily   Pneumonia 2010   hx of   PTSD (post-traumatic stress disorder)    hx of-no meds   Seasonal allergies    takes Zyrtec daily and uses Flonase daily as needed   Stress incontinence, female    Urgency of urination    Vitamin D deficiency    takes Vit D daily    Past Surgical History:  Procedure Laterality Date   ABDOMINAL HYSTERECTOMY     July 2013   BILATERAL  BENIGN BREAST TUMOR REMOVED  YRS AGO   BREAST SURGERY     2 cyst removed from right breast and tumor removed from left breast   CARDIAC CATHETERIZATION  09-09-2008   NORMAL CORONARY ARTERIES/ NORMAL LVSF/ CHRONIC RENAL INSUFF.   COLONOSCOPY     CYSTO WITH HYDRODISTENSION  01/07/2012   Procedure: CYSTOSCOPY/HYDRODISTENSION;  Surgeon: Reece Packer, MD;  Location: [facility];  Service: Urology;  Laterality: N/A;   OF BLADDER WITH INSTILLATION OF MARCAINE AND PRYDIUM   MUSCLE BIOPSY Left 10/04/2015   Procedure: LEFT Vastus Lateralis Muscle Biopsy;  Surgeon: Ashok Pall, MD;  Location: [place] [facility];  Service: Neurosurgery;  Laterality: Left;  LEFT vastus lateralis muscle biopsy   neck cyst     TUBAL LIGATION  YRS AGO   Current Medications: Current Meds  Medication Sig   albuterol (PROVENTIL HFA;VENTOLIN HFA) 108 (90 BASE) MCG/ACT inhaler Inhale 2 puffs into the lungs every 6 (six) hours as needed for wheezing or shortness of breath. Shortness of  breath   aspirin-acetaminophen-caffeine (EXCEDRIN MIGRAINE) 250-250-65 MG tablet Take 2 tablets by mouth every 6 (six) hours as needed for pain.   atenolol (TENORMIN) 100 MG tablet Take 100 mg by mouth daily.   beclomethasone (QVAR) 80 MCG/ACT inhaler Inhale into the lungs as needed.   cetirizine (ZYRTEC) 10 MG tablet Take 10 mg by mouth daily.   Cholecalciferol (VITAMIN D) 2000 UNITS CAPS Take 2,000 Units by mouth daily.   cyclobenzaprine (FEXMID) 7.5 MG tablet Take 7.5 mg by mouth 3 (three) times daily.   diclofenac (CATAFLAM) 50 MG tablet Take by mouth in the morning and at bedtime.   diclofenac Sodium (VOLTAREN) 1 % GEL Apply topically as needed.   diphenhydrAMINE (BENADRYL) 25 mg capsule Take 25 mg by mouth every 6 (six) hours as needed.   esomeprazole (NEXIUM) 40 MG capsule Take 40 mg by mouth daily before breakfast.   estradiol (ESTRACE) 0.1 MG/GM vaginal cream Place vaginally at bedtime.   Ferrous Gluconate (IRON) 240 (27 FE) MG TABS Take 240 mg by mouth daily.    fluticasone (FLONASE) 50 MCG/ACT nasal spray Place 2 sprays into the nose as needed for allergies. congestion   gabapentin (NEURONTIN) 600 MG tablet Take 600 mg by mouth 3 (three) times daily.   glipiZIDE (GLUCOTROL XL) 10 MG 24 hr tablet Take by mouth. Take 1 tablet ('10mg'$ ) by mouth when your blood sugar is over 200   glucose blood (ACCU-CHEK AVIVA PLUS) test strip    ibuprofen (ADVIL,MOTRIN) 600 MG tablet Take 600 mg by mouth every 6 (six) hours as needed for headache.   JANUVIA 100 MG tablet Take 100 mg by mouth daily.   lidocaine (XYLOCAINE) 2 % solution Use as directed 15 mLs in the mouth or throat as needed for mouth pain.   methocarbamol (ROBAXIN) 750 MG tablet Take 750 mg by mouth as needed.   metoprolol tartrate (LOPRESSOR) 100 MG tablet Take 2 hours prior to cardiac CT   Multiple Vitamin (MULTIVITAMIN WITH MINERALS) TABS tablet Take 1 tablet by mouth daily.   Oxycodone HCl 20 MG TABS Take 20 mg by mouth every 4  (four) hours.   triamterene-hydrochlorothiazide (MAXZIDE-25) 37.5-25 MG per tablet Take 1 tablet by mouth daily.    [DISCONTINUED] atenolol (TENORMIN) 50 MG tablet Take 100 mg by mouth daily.   [DISCONTINUED] UNABLE TO FIND Med Name: pt reports using estradol cream two times per day    Allergies:   Avapro [irbesartan], Lisinopril, Statins,  and Latex   Social History   Socioeconomic History   Marital status: Married    Spouse name: Christia Reading   Number of children: 5   Years of education: 13-14   Highest education level: Not on file  Occupational History    Comment: CME, lab tech  Tobacco Use   Smoking status: Former    Packs/day: 1.00    Years: 27.00    Pack years: 27.00    Types: Cigarettes   Smokeless tobacco: Never   Tobacco comments:    quit smoking in 1993  Substance and Sexual Activity   Alcohol use: No    Comment: quit drinking 25+yrs ago   Drug use: No    Comment: nothing in 25+yrs    Sexual activity: Not Currently    Birth control/protection: Surgical, None  Other Topics Concern   Not on file  Social History Narrative   Lives with 2 grandchildren 1 stepson, husband   caffeine use - coffee, tea 1-2 c daily   Social Determinants of Health   Financial Resource Strain: Not on file  Food Insecurity: Not on file  Transportation Needs: Not on file  Physical Activity: Not on file  Stress: Not on file  Social Connections: Not on file   Family History: The patient's family history includes Cancer in her mother and sister; Diabetes in her mother; Heart Problems in her maternal grandfather; Hypertension in her mother, sister, and son; Rheum arthritis in her father.  Sister had MI at age 77.  ROS:   Please see the history of present illness.    Notes hip pain All other systems reviewed and are negative.  EKGs/Labs/Other Studies Reviewed:    The following studies were reviewed today:  EKG:   09/07/20 Sinus rhythm rate 64, occasional PVCs,  09/18/18:  Sinus rhythm  rate 61, normal axis no ST/T changes  Transthoracic Echocardiogram: Date: 09/21/20 Results: 1. Left ventricular ejection fraction, by estimation, is 60 to 65%. The  left ventricle has normal function. The left ventricle has no regional  wall motion abnormalities. There is mild left ventricular hypertrophy.  Left ventricular diastolic parameters  are consistent with Grade II diastolic dysfunction (pseudonormalization).   2. Right ventricular systolic function is normal. The right ventricular  size is mildly enlarged. There is mildly elevated pulmonary artery  systolic pressure. The estimated right ventricular systolic pressure is  62.6 mmHg.   3. Left atrial size was moderately dilated.   4. Right atrial size was mildly dilated.   5. The mitral valve is normal in structure. Trivial mitral valve  regurgitation. No evidence of mitral stenosis.   6. Tricuspid valve regurgitation is moderate.   7. The aortic valve is tricuspid. Aortic valve regurgitation is not  visualized. Mild aortic valve sclerosis is present, with no evidence of  aortic valve stenosis.   8. Aortic dilatation noted. There is mild dilatation of the ascending  aorta, measuring 40 mm.   9. The inferior vena cava is normal in size with greater than 50%  respiratory variability, suggesting right atrial pressure of 3 mmHg.    CT Aorta : Date: 09/21/20 Results: 1. Stable 4.1 cm ascending aortic aneurysm without complicating features. Recommend annual imaging   ECG or NM Stress Testing : Date: 01/31/2014 Results: Normal myovue study with no evidence of ischemia or infarction    Recent Labs: 03/03/2022: ALT 28; BUN 20; Creatinine, Ser 0.82; Hemoglobin 10.9; Platelets 178; Potassium 3.5; Sodium 139  Recent Lipid Panel    Component  Value Date/Time   CHOL 202 (H) 03/01/2010 2025   TRIG 71 03/01/2010 2025   HDL 103 03/01/2010 2025   CHOLHDL 2.0 Ratio 03/01/2010 2025   VLDL 14 03/01/2010 2025   LDLCALC 85 03/01/2010  2025   LDLDIRECT 68 09/27/2010 2034    Physical Exam:    VS:  BP 140/80   Pulse 79   Ht '5\' 9"'$  (1.753 m)   Wt 246 lb (111.6 kg)   SpO2 97%   BMI 36.33 kg/m     Wt Readings from Last 3 Encounters:  05/09/22 246 lb (111.6 kg)  03/03/22 238 lb 1.6 oz (108 kg)  11/10/20 238 lb (108 kg)     Gen: no distress, morbid obesity   Neck: No JVD Cardiac: No Rubs or Gallops, soft systolic Murmur, RRR, +2 radial pulses Respiratory: Clear to auscultation bilaterally, normal effort, normal  respiratory rate GI: Soft, nontender, non-distended  MS: Non pitting bilateral edema;  moves all extremities Integument: Skin feels warm Neuro:  At time of evaluation, alert and oriented to person/place/time/situation  Psych: Normal affect, patient feels well   ASSESSMENT:    1. Precordial pain   2. Bilateral lower extremity edema   3. Aortic atherosclerosis ([place])   4. Thoracic aortic aneurysm without rupture, unspecified part ([place])   5. Hyperlipidemia associated with type 2 diabetes mellitus (HCC)     PLAN:     Chest pain with HTN and DM - atypical chest pain with risk factors and two interval ED visits - will get CCTA to rule out CAD  Tricuspid regurgitation LE edema Morbid Obesity - we have once again offered her lasix; she notes polyuria and has declined additional diuretic - continue compression stockings - will get BMP, BNP and Echo; if evidence of volume overload; we will re-offer diuretic  Hyperlipidemia with diabetes Aortic atherosclerosis Hx of statin related myalgia -LDL goal less than 70 - lipids today  Asymptomatic Thoracic Aneurysm - Last at 41 mm - will get CT Aorta with Coronary eval   6 months APP One year me  Medication Adjustments/Labs and Tests Ordered: Current medicines are reviewed at length with the patient today.  Concerns regarding medicines are outlined above.  Orders Placed This Encounter  Procedures   CT CORONARY MORPH W/CTA COR W/SCORE W/CA W/CM  &/OR WO/CM   CT ANGIO CHEST AORTA W/CM & OR WO/CM   Basic metabolic panel   Pro b natriuretic peptide (BNP)   Lipid panel   ECHOCARDIOGRAM COMPLETE   Meds ordered this encounter  Medications   metoprolol tartrate (LOPRESSOR) 100 MG tablet    Sig: Take 2 hours prior to cardiac CT    Dispense:  1 tablet    Refill:  0    Patient Instructions  Medication Instructions:  Your physician recommends that you continue on your current medications as directed. Please refer to the Current Medication list given to you today.  *If you need a refill on your cardiac medications before your next appointment, please call your pharmacy*   Lab Work: TODAY: BMP, BNP, Lipid panel If you have labs (blood work) drawn today and your tests are completely normal, you will receive your results only by: [place] (if you have MyChart) OR A paper copy in the mail If you have any lab test that is abnormal or we need to change your treatment, we will call you to review the results.   Testing/Procedures: Your physician has requested that you have cardiac CT. Cardiac computed tomography (  CT) is a painless test that uses an x-ray machine to take clear, detailed pictures of your heart. For further information please visit [email or web address].[email or web address]. Please follow instruction sheet as given.  Your physician has requested that you have a CT Aorta.   Your physician has requested that you have an echocardiogram. Echocardiography is a painless test that uses sound waves to create images of your heart. It provides your doctor with information about the size and shape of your heart and how well your heart's chambers and valves are working. This procedure takes approximately one hour. There are no restrictions for this procedure.    Follow-Up: At [facility], you and your health needs are our priority.  As part of our continuing mission to provide you with exceptional heart care, we have created designated Provider Care  Teams.  These Care Teams include your primary Cardiologist (physician) and Advanced Practice Providers (APPs -  Physician Assistants and Nurse Practitioners) who all work together to provide you with the care you need, when you need it.  We recommend signing up for the patient portal called "MyChart".  Sign up information is provided on this After Visit Summary.  MyChart is used to connect with patients for Virtual Visits (Telemedicine).  Patients are able to view lab/test results, encounter notes, upcoming appointments, etc.  Non-urgent messages can be sent to your provider as well.   To learn more about what you can do with MyChart, go to [email or web address].    Your next appointment:   6 month(s)  The format for your next appointment:   In Person  Provider:   Werner Lean, MD  or Melina Copa, PA-C, Ermalinda Barrios, PA-C, or Christen Bame, NP         Other Instructions   Your cardiac CT will be scheduled at the below location:   [facility] [address] [place], [place] [address] [phone]   At [facility], please arrive at the [facility] and Children's Entrance (Entrance C2) of [facility] 30 minutes prior to test start time. You can use the FREE valet parking offered at entrance C (encouraged to control the heart rate for the test)  Proceed to the [facility] Radiology Department (first floor) to check-in and test prep.  All radiology patients and guests should use entrance C2 at [facility], accessed from [facility], even though the hospital's physical address listed is [address]     Please follow these instructions carefully (unless otherwise directed):  On the Night Before the Test: Be sure to Drink plenty of water. Do not consume any caffeinated/decaffeinated beverages or chocolate 12 hours prior to your test. Do not take any antihistamines 12 hours prior to your test.   DO NOT TAKE:  cetirizine (Zyrtec) or diphenhydramine (Benadryl)  On the Day of the Test: Drink plenty of water until 1 hour prior to the test. Do not eat any food 4 hours prior to the test. You may take your regular medications prior to the test.  Take metoprolol (Lopressor) 100 MG by mouth two hours prior to test. HOLD Furosemide/Hydrochlorothiazide morning of the test.   DO NOT TAKE: TRIAMETERENE-HYDROCHLOROTHIAZIDE(MAXZIDE) FEMALES- please wear underwire-free bra if available, avoid dresses & tight clothing          After the Test: Drink plenty of water. After receiving IV contrast, you may experience a mild flushed feeling. This is normal. On occasion, you may experience a mild rash up to 24  hours after the test. This is not dangerous. If this occurs, you can take Benadryl 25 mg and increase your fluid intake. If you experience trouble breathing, this can be serious. If it is severe call 911 IMMEDIATELY. If it is mild, please call our office. If you take any of these medications: Glipizide/Metformin, Avandament, Glucavance, please do not take 48 hours after completing test unless otherwise instructed.  We will call to schedule your test 2-4 weeks out understanding that some insurance companies will need an authorization prior to the service being performed.   For non-scheduling related questions, please contact the cardiac imaging nurse navigator should you have any questions/concerns: Marchia Bond, Cardiac Imaging Nurse Navigator Gordy Clement, Cardiac Imaging Nurse Navigator [facility] Direct Office Dial: [phone]   For scheduling needs, including cancellations and rescheduling, please call [place], [phone].   Important Information About Sugar         Signed, Werner Lean, MD  05/09/2022 10:35 AM    [facility]

## 2022-05-10 LAB — LIPID PANEL
Chol/HDL Ratio: 1.9 ratio (ref 0.0–4.4)
Cholesterol, Total: 239 mg/dL — ABNORMAL HIGH (ref 100–199)
HDL: 126 mg/dL (ref >39)
LDL Chol Calc (NIH): 104 mg/dL — ABNORMAL HIGH (ref 0–99)
Triglycerides: 55 mg/dL (ref 0–149)
VLDL Cholesterol Cal: 9 mg/dL (ref 5–40)

## 2022-05-10 LAB — BASIC METABOLIC PANEL
BUN/Creatinine Ratio: 23 (ref 12–28)
BUN: 22 mg/dL (ref 8–27)
CO2: 27 mmol/L (ref 20–29)
Calcium: 9.2 mg/dL (ref 8.7–10.3)
Chloride: 99 mmol/L (ref 96–106)
Creatinine, Ser: 0.97 mg/dL (ref 0.57–1.00)
Glucose: 144 mg/dL — ABNORMAL HIGH (ref 70–99)
Potassium: 4.4 mmol/L (ref 3.5–5.2)
Sodium: 139 mmol/L (ref 134–144)
eGFR: 63 mL/min/{1.73_m2} (ref >59)

## 2022-05-10 LAB — PRO B NATRIURETIC PEPTIDE: NT-Pro BNP: 114 pg/mL (ref 0–301)

## 2022-05-17 ENCOUNTER — Other Ambulatory Visit: Payer: Medicare Other

## 2022-05-23 ENCOUNTER — Telehealth: Payer: Self-pay

## 2022-05-23 DIAGNOSIS — E1169 Type 2 diabetes mellitus with other specified complication: Secondary | ICD-10-CM

## 2022-05-23 DIAGNOSIS — I700 Atherosclerosis of aorta: Secondary | ICD-10-CM

## 2022-05-23 MED ORDER — EZETIMIBE 10 MG PO TABS: 10.0000 mg | ORAL_TABLET | Freq: Every day | ORAL | 3 refills | Status: DC

## 2022-05-23 NOTE — Telephone Encounter (Signed)
The patient has been notified of the result and verbalized understanding.  All questions (if any) were answered. Precious Gilding, RN 05/23/2022 11:16 AM  Pt request an appointment reminder for f/u [facility]; sent per request.

## 2022-05-24 ENCOUNTER — Other Ambulatory Visit: Payer: Medicare Other

## 2022-05-31 ENCOUNTER — Telehealth (HOSPITAL_COMMUNITY): Payer: Self-pay | Admitting: *Deleted

## 2022-05-31 NOTE — Telephone Encounter (Signed)
Reaching out to patient to offer assistance regarding upcoming cardiac imaging study; pt verbalizes understanding of appt date/time, parking situation and where to check in, pre-test NPO status and medications ordered, and verified current allergies; name and call back number provided for further questions should they arise  Lakyra Tippins RN Navigator Cardiac Imaging [facility] [phone] office [phone] cell  Patient to take 100mg metoprolol tartrate two hours prior to her cardiac CT scan. She is aware to arrive at 2:30pm. 

## 2022-06-03 ENCOUNTER — Ambulatory Visit (HOSPITAL_COMMUNITY)
Admission: RE | Admit: 2022-06-03 | Discharge: 2022-06-03 | Disposition: A | Discharge status: Home / Self Care | Payer: Medicare HMO | Source: Ambulatory Visit | Attending: Internal Medicine | Admitting: Internal Medicine

## 2022-06-03 DIAGNOSIS — I712 Thoracic aortic aneurysm, without rupture, unspecified: Secondary | ICD-10-CM | POA: Insufficient documentation

## 2022-06-03 DIAGNOSIS — R072 Precordial pain: Secondary | ICD-10-CM | POA: Insufficient documentation

## 2022-06-03 DIAGNOSIS — I700 Atherosclerosis of aorta: Secondary | ICD-10-CM | POA: Diagnosis present

## 2022-06-03 MED ORDER — IOHEXOL 350 MG/ML SOLN
100.0000 mL | Freq: Once | INTRAVENOUS | Status: AC | PRN
  Administered 2022-06-03: 100 mL via INTRAVENOUS

## 2022-06-03 MED ORDER — NITROGLYCERIN 0.4 MG SL SUBL
0.8000 mg | SUBLINGUAL_TABLET | Freq: Once | SUBLINGUAL | Status: AC
  Administered 2022-06-03: 0.8 mg via SUBLINGUAL

## 2022-06-03 MED ORDER — NITROGLYCERIN 0.4 MG SL SUBL
SUBLINGUAL_TABLET | SUBLINGUAL | Status: AC
  Filled 2022-06-03: qty 2

## 2022-06-05 ENCOUNTER — Ambulatory Visit (HOSPITAL_COMMUNITY): Payer: Medicare HMO | Attending: Internal Medicine

## 2022-06-05 DIAGNOSIS — R600 Localized edema: Secondary | ICD-10-CM

## 2022-06-05 DIAGNOSIS — I712 Thoracic aortic aneurysm, without rupture, unspecified: Secondary | ICD-10-CM | POA: Diagnosis not present

## 2022-06-05 DIAGNOSIS — R072 Precordial pain: Secondary | ICD-10-CM | POA: Insufficient documentation

## 2022-06-05 LAB — ECHOCARDIOGRAM COMPLETE
Area-P 1/2: 3.17 cm2
S' Lateral: 2.7 cm

## 2022-06-11 ENCOUNTER — Other Ambulatory Visit: Payer: Self-pay | Admitting: *Deleted

## 2022-06-11 DIAGNOSIS — I712 Thoracic aortic aneurysm, without rupture, unspecified: Secondary | ICD-10-CM

## 2022-06-11 NOTE — Progress Notes (Signed)
Order placed for ct chest aorta for June 2024.

## 2022-07-19 ENCOUNTER — Ambulatory Visit (HOSPITAL_COMMUNITY)
Admission: EM | Admit: 2022-07-19 | Discharge: 2022-07-19 | Disposition: A | Discharge status: Home / Self Care | Payer: Medicare HMO | Attending: Emergency Medicine | Admitting: Emergency Medicine

## 2022-07-19 ENCOUNTER — Encounter (HOSPITAL_COMMUNITY): Payer: Self-pay | Admitting: Emergency Medicine

## 2022-07-19 DIAGNOSIS — I7121 Aneurysm of the ascending aorta, without rupture: Secondary | ICD-10-CM | POA: Diagnosis not present

## 2022-07-19 DIAGNOSIS — R079 Chest pain, unspecified: Secondary | ICD-10-CM | POA: Diagnosis not present

## 2022-07-19 NOTE — ED Provider Notes (Signed)
[place]    CSN: [id number] Arrival date & time: 07/19/22  1122     History   Chief Complaint Chief Complaint  Patient presents with   Chest Pain    HPI [name] is a 70 y.o. female.  Presents with central chest pain. Onset yesterday, has been getting worse today.  Reports it is constant pain.  Now 8/10.  Feels it is worse with movement/exertion.  Associated shortness of breath.  History of COPD, diabetes, hypertension, thoracic aortic aneurysm. She had chest CT done 6/12 showing stable 4.1 cm ascending aneurysm.  Atherosclerosis of the aorta as well.  Past Medical History:  Diagnosis Date   Ankle edema BILATERAL   takes Lasix daily   Arthritis    BACK, KNEE, HIPS   Asthma    Cancer (HCC)    Cataracts, bilateral    immature   Chronic back pain    d/t injury in 1995   Chronic bronchitis    Chronic pain syndrome    COPD (chronic obstructive pulmonary disease) (HCC) CHRONIC BRONCHITIS --  LAST BOUT  BILATERAL PNEUMONIA 2009   uses QVAR daily and Albuterol daily as needed   Diabetes mellitus without complication (HCC)    takes Glipizide and Metformin daily   Diverticulosis    Fibromyalgia    Frequency of urination    GERD (gastroesophageal reflux disease)    takes Nexium daily    H/O hiatal hernia    Headache    frequently but related to sinus   Hepatitis    hx of.Not sure if A,B,C   History of blood transfusion    no abnormal reaction noted   History of colon polyps    benign   History of gastric ulcer    History of glaucoma STATES TREATED FOR ABOUT 5 YRS  AGO  --- NO ISSUES SINCE   Hyperlipidemia    was on meds but has been off x 2 months   Hypertension    takes Atenlol and Triamtere  daily    Iron deficiency anemia    takes Iron pill daily   Joint pain    Joint swelling    Nocturia    Peripheral neuropathy    takes Gabapentin daily   Pneumonia 2010   hx of   PTSD (post-traumatic stress disorder)    hx of-no meds   Seasonal  allergies    takes Zyrtec daily and uses Flonase daily as needed   Stress incontinence, female    Urgency of urination    Vitamin D deficiency    takes Vit D daily    Patient Active Problem List   Diagnosis Date Noted   Claudication in peripheral vascular disease ([place]) 11/10/2020   Bilateral lower extremity edema 11/10/2020   Thoracic aortic aneurysm without rupture ([place]) 11/10/2020   Aortic atherosclerosis ([place]) 09/07/2020   Chest pain 01/18/2014   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    History of Bell's palsy    Pelvic pain in female    Stress incontinence, female    Nocturia    History of glaucoma    Uterine fibroid    Hyperlipidemia    COPD (chronic obstructive pulmonary disease) (HCC)    Iron deficiency anemia    H/O hiatal hernia    Fibromyalgia    Arthritis    Chronic renal insufficiency    Diabetes mellitus with coincident hypertension ([place])     Past Surgical History:  Procedure Laterality Date   ABDOMINAL  HYSTERECTOMY     July 2013   BILATERAL BENIGN BREAST TUMOR REMOVED  YRS AGO   BREAST SURGERY     2 cyst removed from right breast and tumor removed from left breast   CARDIAC CATHETERIZATION  09-09-2008   NORMAL CORONARY ARTERIES/ NORMAL LVSF/ CHRONIC RENAL INSUFF.   COLONOSCOPY     CYSTO WITH HYDRODISTENSION  01/07/2012   Procedure: CYSTOSCOPY/HYDRODISTENSION;  Surgeon: Reece Packer, MD;  Location: [facility];  Service: Urology;  Laterality: N/A;   OF BLADDER WITH INSTILLATION OF MARCAINE AND PRYDIUM   MUSCLE BIOPSY Left 10/04/2015   Procedure: LEFT Vastus Lateralis Muscle Biopsy;  Surgeon: Ashok Pall, MD;  Location: [facility];  Service: Neurosurgery;  Laterality: Left;  LEFT vastus lateralis muscle biopsy   neck cyst     TUBAL LIGATION  YRS AGO    OB History     Gravida  5   Para  5   Term  5   Preterm      AB      Living  5      SAB      IAB      Ectopic      Multiple      Live Births                Home Medications    Prior to Admission medications   Medication Sig Start Date End Date Taking? Authorizing Provider  albuterol (PROVENTIL HFA;VENTOLIN HFA) 108 (90 BASE) MCG/ACT inhaler Inhale 2 puffs into the lungs every 6 (six) hours as needed for wheezing or shortness of breath. Shortness of breath    [provider]  aspirin-acetaminophen-caffeine (EXCEDRIN MIGRAINE) [phone] MG tablet Take 2 tablets by mouth every 6 (six) hours as needed for pain.    [provider]  atenolol (TENORMIN) 100 MG tablet Take 100 mg by mouth daily.    [provider]  beclomethasone (QVAR) 80 MCG/ACT inhaler Inhale into the lungs as needed.    [provider]  cetirizine (ZYRTEC) 10 MG tablet Take 10 mg by mouth daily.    [provider]  Cholecalciferol (VITAMIN D) 2000 UNITS CAPS Take 2,000 Units by mouth daily.    [provider]  cyclobenzaprine (FEXMID) 7.5 MG tablet Take 7.5 mg by mouth 3 (three) times daily. 04/19/22   [provider]  diclofenac (CATAFLAM) 50 MG tablet Take by mouth in the morning and at bedtime. 04/19/22   [provider]  diclofenac Sodium (VOLTAREN) 1 % GEL Apply topically as needed. 04/19/22   [provider]  diphenhydrAMINE (BENADRYL) 25 mg capsule Take 25 mg by mouth every 6 (six) hours as needed.    [provider]  esomeprazole (NEXIUM) 40 MG capsule Take 40 mg by mouth daily before breakfast.    [provider]  estradiol (ESTRACE) 0.1 MG/GM vaginal cream Place vaginally at bedtime. 01/07/22   [provider]  ezetimibe (ZETIA) 10 MG tablet Take 1 tablet (10 mg total) by mouth daily. 05/23/22   Chandrasekhar, Terisa Starr, MD  Ferrous Gluconate (IRON) 240 (27 FE) MG TABS Take 240 mg by mouth daily.     [provider]  fluticasone (FLONASE) 50 MCG/ACT nasal spray Place 2 sprays into the nose as needed for allergies. congestion    [provider]   gabapentin (NEURONTIN) 600 MG tablet Take 600 mg by mouth 3 (three) times daily.    [provider]  glipiZIDE (GLUCOTROL XL)  10 MG 24 hr tablet Take by mouth. Take 1 tablet ('10mg'$ ) by mouth when your blood sugar is over 200    [provider]  glucose blood (ACCU-CHEK AVIVA PLUS) test strip  08/30/16   [provider]  ibuprofen (ADVIL,MOTRIN) 600 MG tablet Take 600 mg by mouth every 6 (six) hours as needed for headache.    [provider]  JANUVIA 100 MG tablet Take 100 mg by mouth daily. 04/19/22   [provider]  lidocaine (XYLOCAINE) 2 % solution Use as directed 15 mLs in the mouth or throat as needed for mouth pain. 09/21/17   Glyn Ade, PA-C  methocarbamol (ROBAXIN) 750 MG tablet Take 750 mg by mouth as needed. 06/13/20   [provider]  metoprolol tartrate (LOPRESSOR) 100 MG tablet Take 2 hours prior to cardiac CT 05/09/22   Werner Lean, MD  Multiple Vitamin (MULTIVITAMIN WITH MINERALS) TABS tablet Take 1 tablet by mouth daily.    [provider]  Oxycodone HCl 20 MG TABS Take 20 mg by mouth every 4 (four) hours. 09/12/17   [provider]  triamterene-hydrochlorothiazide (MAXZIDE-25) 37.5-25 MG per tablet Take 1 tablet by mouth daily.  01/13/14   [provider]  SYMBICORT 160-4.5 MCG/ACT inhaler  04/04/20 11/21/20  [provider]    Family History Family History  Problem Relation Age of Onset   Cancer Mother    Diabetes Mother    Hypertension Mother    Rheum arthritis Father    Cancer Sister    Hypertension Sister    Hypertension Son    Heart Problems Maternal Grandfather     Social History Social History   Tobacco Use   Smoking status: Former    Packs/day: 1.00    Years: 27.00    Total pack years: 27.00    Types: Cigarettes   Smokeless tobacco: Never   Tobacco comments:    quit smoking in 1993  Substance Use Topics   Alcohol use: No    Comment: quit drinking  25+yrs ago   Drug use: No    Comment: nothing in 25+yrs      Allergies   Avapro [irbesartan], Lisinopril, Statins, and Latex   Review of Systems Review of Systems  Cardiovascular:  Positive for chest pain.   Per HPI  Physical Exam Triage Vital Signs ED Triage Vitals  Enc Vitals Group     BP 07/19/22 1135 (!) 158/79     Pulse Rate 07/19/22 1135 70     Resp 07/19/22 1135 19     Temp 07/19/22 1135 98.5 F (36.9 C)     Temp Source 07/19/22 1135 Oral     SpO2 07/19/22 1135 97 %     Weight --      Height --      Head Circumference --      Peak Flow --      Pain Score 07/19/22 1134 8     Pain Loc --      Pain Edu? --      Excl. in [place]? --    No data found.  Updated Vital Signs BP (!) 158/79 (BP Location: Right Arm)   Pulse 70   Temp 98.5 F (36.9 C) (Oral)   Resp 19   SpO2 97%     Physical Exam Vitals and nursing note reviewed.  Constitutional:      Appearance: She is well-developed.  Cardiovascular:     Rate and Rhythm: Normal rate and  regular rhythm.     Heart sounds: Normal heart sounds.  Pulmonary:     Effort: Pulmonary effort is normal.  Chest:     Chest wall: No tenderness.  Skin:    General: Skin is warm and dry.  Neurological:     General: No focal deficit present.     Mental Status: She is alert and oriented to person, place, and time.     UC Treatments / Results  Labs (all labs ordered are listed, but only abnormal results are displayed) Labs Reviewed - No data to display  EKG  Radiology No results found.  Procedures Procedures   Medications Ordered in UC Medications - No data to display  Initial Impression / Assessment and Plan / UC Course  I have reviewed the triage vital signs and the nursing notes.  Pertinent labs & imaging results that were available during my care of the patient were reviewed by me and considered in my medical decision making (see chart for details).  EKG sinus rhythm, ventricular rate 66 bpm. Occasional  premature beats which are new compared with previous EKG 02/2022. No ischemic changes.  With patient history and worsening chest pain, I recommend she be evaluated in the emergency department. Needs further workup and higher level of care. We did discuss history of her aneurysm which patient reports no one has told her about. Discussed etiology and how presence of this is concerning with her new onset of chest pain. Patient agrees to be seen in ED, declines EMS transport. D/C to ED in stable condition given resources at this urgent care.  Final Clinical Impressions(s) / UC Diagnoses   Final diagnoses:  Chest pain, unspecified type  Aneurysm of ascending aorta without rupture [facility])     Discharge Instructions      D/C to ED     ED Prescriptions   None    PDMP not reviewed this encounter.   Deltha Bernales, [place], [place] 07/19/22 1213

## 2022-07-19 NOTE — Discharge Instructions (Signed)
D/C to ED 

## 2022-07-19 NOTE — ED Notes (Signed)
Patient is being discharged from the Urgent Care and sent to the Emergency Department via POV . Per [employer], PA, patient is in need of higher level of care due to chest pain and hx aortic aneurysm. Patient is aware and verbalizes understanding of plan of care.  Vitals:   07/19/22 1135  BP: (!) 158/79  Pulse: 70  Resp: 19  Temp: 98.5 F (36.9 C)  SpO2: 97%

## 2022-07-19 NOTE — ED Triage Notes (Signed)
Pt reports central chest pains that started yesterday after getting off work. Reports pain has gotten worse and constant.pain is worse with movement  Hx asthma.

## 2022-07-24 ENCOUNTER — Ambulatory Visit: Payer: Medicare Other | Admitting: Internal Medicine

## 2022-08-05 ENCOUNTER — Emergency Department (HOSPITAL_COMMUNITY)
Admission: EM | Admit: 2022-08-05 | Discharge: 2022-08-06 | Disposition: A | Discharge status: Home / Self Care | Payer: Medicare HMO | Attending: Emergency Medicine | Admitting: Emergency Medicine

## 2022-08-05 ENCOUNTER — Other Ambulatory Visit: Payer: Self-pay

## 2022-08-05 ENCOUNTER — Encounter (HOSPITAL_COMMUNITY): Payer: Self-pay | Admitting: *Deleted

## 2022-08-05 DIAGNOSIS — Z7951 Long term (current) use of inhaled steroids: Secondary | ICD-10-CM | POA: Insufficient documentation

## 2022-08-05 DIAGNOSIS — Z91040 Latex allergy status: Secondary | ICD-10-CM | POA: Diagnosis not present

## 2022-08-05 DIAGNOSIS — Z79899 Other long term (current) drug therapy: Secondary | ICD-10-CM | POA: Diagnosis not present

## 2022-08-05 DIAGNOSIS — J449 Chronic obstructive pulmonary disease, unspecified: Secondary | ICD-10-CM | POA: Insufficient documentation

## 2022-08-05 DIAGNOSIS — Z7982 Long term (current) use of aspirin: Secondary | ICD-10-CM | POA: Insufficient documentation

## 2022-08-05 DIAGNOSIS — E119 Type 2 diabetes mellitus without complications: Secondary | ICD-10-CM | POA: Diagnosis not present

## 2022-08-05 DIAGNOSIS — Z7984 Long term (current) use of oral hypoglycemic drugs: Secondary | ICD-10-CM | POA: Diagnosis not present

## 2022-08-05 DIAGNOSIS — M7918 Myalgia, other site: Secondary | ICD-10-CM | POA: Insufficient documentation

## 2022-08-05 DIAGNOSIS — M791 Myalgia, unspecified site: Secondary | ICD-10-CM | POA: Diagnosis present

## 2022-08-05 DIAGNOSIS — Z20822 Contact with and (suspected) exposure to covid-19: Secondary | ICD-10-CM | POA: Insufficient documentation

## 2022-08-05 DIAGNOSIS — M79661 Pain in right lower leg: Secondary | ICD-10-CM | POA: Diagnosis not present

## 2022-08-05 LAB — CBC WITH DIFFERENTIAL/PLATELET
Abs Immature Granulocytes: 0.01 10*3/uL (ref 0.00–0.07)
Basophils Absolute: 0.1 10*3/uL (ref 0.0–0.1)
Basophils Relative: 1 %
Eosinophils Absolute: 0.1 10*3/uL (ref 0.0–0.5)
Eosinophils Relative: 2 %
HCT: 33.3 % — ABNORMAL LOW (ref 36.0–46.0)
Hemoglobin: 10.9 g/dL — ABNORMAL LOW (ref 12.0–15.0)
Immature Granulocytes: 0 %
Lymphocytes Relative: 43 %
Lymphs Abs: 2.3 10*3/uL (ref 0.7–4.0)
MCH: 28.9 pg (ref 26.0–34.0)
MCHC: 32.7 g/dL (ref 30.0–36.0)
MCV: 88.3 fL (ref 80.0–100.0)
Monocytes Absolute: 0.5 10*3/uL (ref 0.1–1.0)
Monocytes Relative: 9 %
Neutro Abs: 2.4 10*3/uL (ref 1.7–7.7)
Neutrophils Relative %: 45 %
Platelets: 182 10*3/uL (ref 150–400)
RBC: 3.77 MIL/uL — ABNORMAL LOW (ref 3.87–5.11)
RDW: 14.2 % (ref 11.5–15.5)
WBC: 5.3 10*3/uL (ref 4.0–10.5)
nRBC: 0 % (ref 0.0–0.2)

## 2022-08-05 LAB — BASIC METABOLIC PANEL
Anion gap: 7 (ref 5–15)
BUN: 16 mg/dL (ref 8–23)
CO2: 28 mmol/L (ref 22–32)
Calcium: 9.4 mg/dL (ref 8.9–10.3)
Chloride: 103 mmol/L (ref 98–111)
Creatinine, Ser: 0.89 mg/dL (ref 0.44–1.00)
GFR, Estimated: >60 mL/min (ref >60)
Glucose, Bld: 101 mg/dL — ABNORMAL HIGH (ref 70–99)
Potassium: 3.6 mmol/L (ref 3.5–5.1)
Sodium: 138 mmol/L (ref 135–145)

## 2022-08-05 LAB — RESP PANEL BY RT-PCR (FLU A&B, COVID) ARPGX2
Influenza A by PCR: NEGATIVE
Influenza B by PCR: NEGATIVE
SARS Coronavirus 2 by RT PCR: NEGATIVE

## 2022-08-05 NOTE — ED Provider Triage Note (Signed)
Emergency Medicine Provider Triage Evaluation Note  [name] , a 71 y.o. female  was evaluated in triage.  Pt complains of generalized body pain.  Patient reports that she has a history of chronic pain but states that this is "more intense."  Patient reports that pain has been present over the last 3 to 4 days.  Patient reports that she has been increasing her hydration over the last 3 to 4 days.  When asked about rhinorrhea and nasal congestion patient states that she has chronic sinusitis and is unable to tell if she has had a change.  Review of Systems  Positive: Generalized myalgia Negative: Fever, chills, cough  Physical Exam  BP (!) 174/88 (BP Location: Right Arm)   Pulse 73   Temp 99.1 F (37.3 C) (Oral)   Resp 16   SpO2 97%  Gen:   Awake, no distress   Resp:  Normal effort, to auscultation bilaterally MSK:   Moves extremities without difficulty  Other:    Medical Decision Making  Medically screening exam initiated at 9:14 PM.  Appropriate orders placed.  JAYLIN ROUNDY was informed that the remainder of the evaluation will be completed by another provider, this initial triage assessment does not replace that evaluation, and the importance of remaining in the ED until their evaluation is complete.     Loni Beckwith, [place] 08/05/22 2115

## 2022-08-05 NOTE — ED Triage Notes (Signed)
Pt says she has been hurting all over for about 4 days, reports she has chronic pain, but this "is more intense'.

## 2022-08-06 ENCOUNTER — Emergency Department (HOSPITAL_BASED_OUTPATIENT_CLINIC_OR_DEPARTMENT_OTHER): Payer: Medicare HMO

## 2022-08-06 ENCOUNTER — Emergency Department (HOSPITAL_COMMUNITY): Payer: Medicare HMO

## 2022-08-06 DIAGNOSIS — M79609 Pain in unspecified limb: Secondary | ICD-10-CM | POA: Diagnosis not present

## 2022-08-06 LAB — CK: Total CK: 328 U/L — ABNORMAL HIGH (ref 38–234)

## 2022-08-06 MED ORDER — OXYCODONE-ACETAMINOPHEN 5-325 MG PO TABS
1.0000 | ORAL_TABLET | Freq: Once | ORAL | Status: AC
  Administered 2022-08-06: 1 via ORAL
  Filled 2022-08-06: qty 1

## 2022-08-06 NOTE — ED Notes (Signed)
Called pt for vitals no answer 

## 2022-08-06 NOTE — ED Provider Notes (Signed)
[facility] Provider Note   CSN: [id number] Arrival date & time: 08/05/22  2053     History  Chief Complaint  [name] presents with   Generalized Body Aches    [name] is a 71 y.o. female with a past medical history significant for sarcoidosis, hyperlipidemia, COPD, fibromyalgia, and diabetes who presents to the ED due to "pain all over".  [name] states [name] has been having significant pain throughout her entire body for the past 4 days.  [name] states it feels like when [name] last had COVID.  No known injury.  Denies chest pain and shortness of breath.  [name] states her right lower extremity directly behind her right knee has significant pain.  No history of blood clots.  No known injury.  Admits to chronic bilateral edema around her ankles.  [name] is on chronic pain medication.   History obtained from [name] and past medical records. No interpreter used during encounter.       Home Medications Prior to Admission medications   Medication Sig Start Date End Date Taking? Authorizing Provider  albuterol (PROVENTIL HFA;VENTOLIN HFA) 108 (90 BASE) MCG/ACT inhaler Inhale 2 puffs into the lungs every 6 (six) hours as needed for wheezing or shortness of breath. Shortness of breath    [provider]  aspirin-acetaminophen-caffeine (EXCEDRIN MIGRAINE) [id number] MG tablet Take 2 tablets by mouth every 6 (six) hours as needed for pain.    [provider]  atenolol (TENORMIN) 100 MG tablet Take 100 mg by mouth daily.    [provider]  beclomethasone (QVAR) 80 MCG/ACT inhaler Inhale into the lungs as needed.    [provider]  cetirizine (ZYRTEC) 10 MG tablet Take 10 mg by mouth daily.    [provider]  Cholecalciferol (VITAMIN D) 2000 UNITS CAPS Take 2,000 Units by mouth daily.    [provider]  cyclobenzaprine (FEXMID) 7.5 MG tablet Take 7.5 mg by mouth 3 (three) times daily. 04/19/22    [provider]  diclofenac (CATAFLAM) 50 MG tablet Take by mouth in the morning and at bedtime. 04/19/22   [provider]  diclofenac Sodium (VOLTAREN) 1 % GEL Apply topically as needed. 04/19/22   [provider]  diphenhydrAMINE (BENADRYL) 25 mg capsule Take 25 mg by mouth every 6 (six) hours as needed.    [provider]  esomeprazole (NEXIUM) 40 MG capsule Take 40 mg by mouth daily before breakfast.    [provider]  estradiol (ESTRACE) 0.1 MG/GM vaginal cream Place vaginally at bedtime. 01/07/22   [provider]  ezetimibe (ZETIA) 10 MG tablet Take 1 tablet (10 mg total) by mouth daily. 05/23/22   Chandrasekhar, Terisa Starr, MD  Ferrous Gluconate (IRON) 240 (27 FE) MG TABS Take 240 mg by mouth daily.     [provider]  fluticasone (FLONASE) 50 MCG/ACT nasal spray Place 2 sprays into the nose as needed for allergies. congestion    [provider]  gabapentin (NEURONTIN) 600 MG tablet Take 600 mg by mouth 3 (three) times daily.    [provider]  glipiZIDE (GLUCOTROL XL) 10 MG 24 hr tablet Take by mouth. Take 1 tablet ('10mg'$ ) by mouth when your blood sugar is over 200    [provider]  glucose blood (ACCU-CHEK AVIVA PLUS) test strip  08/30/16   [provider]  ibuprofen (ADVIL,MOTRIN) 600 MG tablet Take 600 mg by mouth every 6 (six) hours as needed for headache.  [provider]  JANUVIA 100 MG tablet Take 100 mg by mouth daily. 04/19/22   [provider]  lidocaine (XYLOCAINE) 2 % solution Use as directed 15 mLs in the mouth or throat as needed for mouth pain. 09/21/17   Glyn Ade, PA-C  methocarbamol (ROBAXIN) 750 MG tablet Take 750 mg by mouth as needed. 06/13/20   [provider]  metoprolol tartrate (LOPRESSOR) 100 MG tablet Take 2 hours prior to cardiac CT 05/09/22   Werner Lean, MD  Multiple Vitamin (MULTIVITAMIN WITH MINERALS) TABS tablet Take 1  tablet by mouth daily.    [provider]  Oxycodone HCl 20 MG TABS Take 20 mg by mouth every 4 (four) hours. 09/12/17   [provider]  triamterene-hydrochlorothiazide (MAXZIDE-25) 37.5-25 MG per tablet Take 1 tablet by mouth daily.  01/13/14   [provider]  SYMBICORT 160-4.5 MCG/ACT inhaler  04/04/20 11/21/20  [provider]      Allergies    Avapro [irbesartan], Lisinopril, Statins, and Latex    Review of Systems   Review of Systems  Constitutional:  Negative for chills and fever.  Respiratory:  Negative for cough and shortness of breath.   Cardiovascular:  Negative for chest pain.  Musculoskeletal:  Positive for arthralgias and myalgias.  Neurological:  Negative for weakness.    Physical Exam Updated Vital Signs BP 124/84   Pulse 60   Temp 98 F (36.7 C)   Resp 16   SpO2 100%  Physical Exam Vitals and nursing note reviewed.  Constitutional:      General: [name] is not in acute distress.    Appearance: [name] is not ill-appearing.  HENT:     Head: Normocephalic.  Eyes:     Pupils: Pupils are equal, round, and reactive to light.  Cardiovascular:     Rate and Rhythm: Normal rate and regular rhythm.     Pulses: Normal pulses.     Heart sounds: Normal heart sounds. No murmur heard.    No friction rub. No gallop.  Pulmonary:     Effort: Pulmonary effort is normal.     Breath sounds: Normal breath sounds.  Abdominal:     General: Abdomen is flat. There is no distension.     Palpations: Abdomen is soft.     Tenderness: There is no abdominal tenderness. There is no guarding or rebound.  Musculoskeletal:        General: Normal range of motion.     Cervical back: Neck supple.     Comments: Trace edema to bilateral ankles.  Tenderness to posterior right knee.  Full range of motion of right knee.  No erythema or edema to right knee.  Skin:    General: Skin is warm and dry.  Neurological:     General: No focal deficit present.     Mental  Status: [name] is alert.  Psychiatric:        Mood and Affect: Mood normal.        Behavior: Behavior normal.     ED Results / Procedures / Treatments   Labs (all labs ordered are listed, but only abnormal results are displayed) Labs Reviewed  BASIC METABOLIC PANEL - Abnormal; Notable for the following components:      Result Value   Glucose, Bld 101 (*)    All other components within normal limits  CBC WITH DIFFERENTIAL/PLATELET - Abnormal; Notable for the following components:   RBC 3.77 (*)    Hemoglobin 10.9 (*)  HCT 33.3 (*)    All other components within normal limits  CK - Abnormal; Notable for the following components:   Total CK 328 (*)    All other components within normal limits  RESP PANEL BY RT-PCR (FLU A&B, COVID) ARPGX2    EKG None  Radiology VAS [place] LOWER EXTREMITY VENOUS (DVT) (7a-7p)  Result Date: 08/06/2022  Lower Venous DVT Study [name] Name:  KAYSE PUCCINI Lukins  Date of Exam:   08/06/2022 Medical Rec #: [id number]       Accession #:    [id number] Date of Birth: [birth year]        [name] Gender: F [name] Age:   57 years Exam Location:  [facility] Procedure:      VAS [place] LOWER EXTREMITY VENOUS (DVT) Referring Phys: Charmaine Downs --------------------------------------------------------------------------------  Indications: Left leg pain- [name] with chronic pain; however more intense pain since 3-4 days.  Comparison Study: 11-30-2020 Prior ABI study showed normal lower extremity                   arterial flow bilaterally. Performing Technologist: Darlin Coco RDMS, RVT  Examination Guidelines: A complete evaluation includes B-mode imaging, spectral Doppler, color Doppler, and power Doppler as needed of all accessible portions of each vessel. Bilateral testing is considered an integral part of a complete examination. Limited examinations for reoccurring indications may be performed as noted. The reflux portion of the exam is performed with the [name] in reverse  Trendelenburg.  +-----+---------------+---------+-----------+----------+--------------+ RIGHTCompressibilityPhasicitySpontaneityPropertiesThrombus Aging +-----+---------------+---------+-----------+----------+--------------+ CFV  Full           Yes      Yes                                 +-----+---------------+---------+-----------+----------+--------------+   +---------+---------------+---------+-----------+----------+--------------+ LEFT     CompressibilityPhasicitySpontaneityPropertiesThrombus Aging +---------+---------------+---------+-----------+----------+--------------+ CFV      Full           Yes      Yes                                 +---------+---------------+---------+-----------+----------+--------------+ SFJ      Full                                                        +---------+---------------+---------+-----------+----------+--------------+ FV Prox  Full                                                        +---------+---------------+---------+-----------+----------+--------------+ FV Mid   Full                                                        +---------+---------------+---------+-----------+----------+--------------+ FV DistalFull                                                        +---------+---------------+---------+-----------+----------+--------------+  PFV      Full                                                        +---------+---------------+---------+-----------+----------+--------------+ POP      Full           Yes      Yes                                 +---------+---------------+---------+-----------+----------+--------------+ PTV      Full                                                        +---------+---------------+---------+-----------+----------+--------------+ PERO     Full                                                         +---------+---------------+---------+-----------+----------+--------------+     Summary: RIGHT: - No evidence of common femoral vein obstruction.  LEFT: - There is no evidence of deep vein thrombosis in the lower extremity.  - No cystic structure found in the popliteal fossa.  *See table(s) above for measurements and observations.    Preliminary    DG Knee Complete 4 Views Right  Result Date: 08/06/2022 CLINICAL DATA:  Chronic pain EXAM: RIGHT KNEE - COMPLETE 4+ VIEW COMPARISON:  None Available. FINDINGS: No fracture or dislocation of the right knee. 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 No knee joint effusion. Soft tissue edema anteriorly. IMPRESSION: 1.  No fracture or dislocation of the right knee. 2. 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 3.  Soft tissue edema anteriorly. Electronically Signed   By: Delanna Ahmadi M.D.   On: 08/06/2022 13:36    Procedures Procedures    Medications Ordered in ED Medications  oxyCODONE-acetaminophen (PERCOCET/ROXICET) 5-325 MG per tablet 1 tablet (1 tablet Oral Given 08/06/22 1519)    ED Course/ Medical Decision Making/ A&P Clinical Course as of 08/06/22 1526  Tue Aug 06, 2022  1516 CK Total(!): 328 [VB]    Clinical Course User Index [VB] Elgie Congo, MD                           Medical Decision Making Amount and/or Complexity of Data Reviewed External Data Reviewed: notes. Labs: ordered. Decision-making details documented in ED Course. Radiology: ordered and independent interpretation performed. Decision-making details documented in ED Course.  Risk OTC drugs. Prescription drug management.   This [name] presents to the ED for concern of myalgias this involves an extensive number of treatment options, and is a complaint that carries with it a high risk of complications and morbidity.  The differential diagnosis includes COVID, electrolyte abnormalities, DVT,  MSK etiology, rhabdomyolysis, etc  71 year old female presents to the ED due to full body myalgias for the past 4 days.  No fever or chills.  Denies cough.  Denies chest  pain and shortness of breath.  History of chronic pain on chronic pain medication. [name] admits to significant pain in right lower extremity directly behind right knee.  No known injury.  No history of DVT.  Upon arrival, stable vitals.  No acute distress.  Mild tenderness throughout posterior aspect of right knee.  Trace edema to bilateral ankles.  Labs ordered at triage to rule out electrolyte abnormalities.  Added CK to rule out rhabdomyolysis, [place] to rule out DVT, x-ray to rule out bony fracture. Percocet given.  [name] denies chest pain and shortness of breath.  Low suspicion for ACS.  COVID/flu to rule out infection.  COVID/influenza negative.  CBC significant for anemia with hemoglobin at 10.9.  No leukocytosis.  BMP reassuring.  Normal renal function no major electrolyte derangements. CK mildly elevated at 328. Appears to have been elevated in the past. Advised [name] to follow-up with PCP within 1 week.  Suspect pain related to chronic pain syndrome.  No evidence of DVT on ultrasound.  X-ray normal.  Reassessed [name] after Percocet who notes mild improvement in symptoms.  Suspect symptoms related to chronic pain syndrome.  Advised [name] to follow-up with pain management and take pain medication as prescribed by doctor. Strict ED precautions discussed with [name]. [name] states understanding and agrees to plan. [name] discharged home in no acute distress and stable vitals  Discussed with Dr. Nechama Guard who evaluated [name] at bedside and agrees with assessment and plan. ]  Co morbidities that complicate the [name] evaluation  Chronic pain syndrome Fibromyalgia HL DM   Critical Interventions:  Oxycodone for pain  Reevaluation:  After the interventions noted above, I reevaluated the [name] and found that they  have :improved         Final Clinical Impression(s) / ED Diagnoses Final diagnoses:  Myalgia    Rx / DC Orders ED Discharge Orders     None         Karie Kirks 08/06/22 1541    Elgie Congo, MD 08/06/22 1806

## 2022-08-06 NOTE — Discharge Instructions (Addendum)
It was a pleasure taking care of you today.  As discussed, all of your labs were reassuring.  Please follow-up with PCP within the next week for further evaluation.  Continue taking your pain medication as previously prescribed.  Ultrasound did not show any blood clots.  X-ray was normal.  Return to the ER for new or worsening symptoms.

## 2022-08-06 NOTE — Progress Notes (Signed)
Lower extremity venous left study completed.  Preliminary results relayed to Aberman, PA.  See CV Proc for preliminary results report.   Khaliah Barnick, RDMS, RVT  

## 2022-08-06 NOTE — ED Notes (Signed)
Pt not responding for vitals 2x  

## 2022-08-27 ENCOUNTER — Other Ambulatory Visit: Payer: Self-pay | Admitting: Family Medicine

## 2022-08-27 DIAGNOSIS — N3281 Overactive bladder: Secondary | ICD-10-CM

## 2022-08-29 ENCOUNTER — Ambulatory Visit
Admission: RE | Admit: 2022-08-29 | Discharge: 2022-08-29 | Disposition: A | Discharge status: Home / Self Care | Payer: Medicare HMO | Source: Ambulatory Visit | Attending: Family Medicine | Admitting: Family Medicine

## 2022-08-29 DIAGNOSIS — N3281 Overactive bladder: Secondary | ICD-10-CM

## 2022-09-09 ENCOUNTER — Other Ambulatory Visit: Payer: Medicare Other

## 2022-10-02 ENCOUNTER — Emergency Department (HOSPITAL_COMMUNITY)
Admission: EM | Admit: 2022-10-02 | Discharge: 2022-10-03 | Disposition: A | Discharge status: Home / Self Care | Payer: Medicare HMO | Attending: Emergency Medicine | Admitting: Emergency Medicine

## 2022-10-02 ENCOUNTER — Other Ambulatory Visit: Payer: Self-pay

## 2022-10-02 DIAGNOSIS — M79661 Pain in right lower leg: Secondary | ICD-10-CM | POA: Diagnosis present

## 2022-10-02 DIAGNOSIS — M79662 Pain in left lower leg: Secondary | ICD-10-CM | POA: Insufficient documentation

## 2022-10-02 DIAGNOSIS — G8929 Other chronic pain: Secondary | ICD-10-CM | POA: Diagnosis not present

## 2022-10-02 NOTE — ED Triage Notes (Signed)
Pt c/o chronic bilateral leg pain x 2 months.

## 2022-10-03 ENCOUNTER — Encounter (HOSPITAL_COMMUNITY): Payer: Self-pay | Admitting: Emergency Medicine

## 2022-10-03 NOTE — ED Provider Notes (Signed)
[facility] Emergency Department Provider Note MRN:  [id number]  Arrival date & time: 10/03/22     Chief Complaint   Leg Pain   History of Present Illness   [name] is a 71 y.o. year-old female presents to the ED with chief complaint of bilateral lower extremity pain for the past 2 months.  She has history of bad arthritis.  She states that she is in pain management.  She states that she does not like to take the pain medicine because it "dopes her up."  She states that she cannot find any other solution to her pain.  She reports having been seen by orthopedics, who recommended surgery, but her regular doctor advised against this.  She states she is uncertain how to proceed.  History provided by patient.   Review of Systems  Pertinent positive and negative review of systems noted in HPI.    Physical Exam   Vitals:   10/02/22 2359  BP: (!) 156/97  Pulse: 75  Resp: 18  Temp: 98.3 F (36.8 C)  SpO2: 97%    CONSTITUTIONAL:  well-appearing, NAD NEURO:  Alert and oriented x 3, CN 3-12 grossly intact EYES:  eyes equal and reactive ENT/NECK:  Supple, no stridor  CARDIO:  appears well-perfused  PULM:  No respiratory distress,  GI/GU:  non-distended,  MSK/SPINE:  No gross deformities, no edema, moves all extremities, ambulates with cane without difficulty SKIN:  no rash, atraumatic   *Additional and/or pertinent findings included in MDM below  Diagnostic and Interventional Summary     Labs Reviewed - No data to display  No orders to display    Medications - No data to display   Procedures  /  Critical Care Procedures  ED Course and Medical Decision Making  I have reviewed the triage vital signs, the nursing notes, and pertinent available records from the EMR.  Social Determinants Affecting Complexity of Care: Patient has no clinically significant social determinants affecting this chief complaint..   ED Course:    Medical  Decision Making Patient here with acute on chronic pain.  Has been having symptoms for several months.  I reviewed her prior x-ray of her knee which shows tricompartmental arthritis.  I advised her to follow-up with her doctor, pain management doctor, as well as orthopedics.  At this time, I do not know that any additional work-up is beneficial from an emergency standpoint.     Consultants: No consultations were needed in caring for this patient.   Treatment and Plan: Emergency department workup does not suggest an emergent condition requiring admission or immediate intervention beyond  what has been performed at this time. The patient is safe for discharge and has  been instructed to return immediately for worsening symptoms, change in  symptoms or any other concerns    Final Clinical Impressions(s) / ED Diagnoses     ICD-10-CM   1. Chronic pain of both lower extremities  M79.604    M79.605    G89.29       ED Discharge Orders     None         Discharge Instructions Discussed with and Provided to Patient:   Discharge Instructions      Please follow-up with your pain management doctor as well as with your orthopedic doctor.  Continue taking medications as prescribed.  You might consider a second opinion from an orthopedic.      Montine Circle, PA-C 10/03/22 0232    [place],  Barbette Hair, MD 10/03/22 [phone]

## 2022-10-03 NOTE — Discharge Instructions (Signed)
Please follow-up with your pain management doctor as well as with your orthopedic doctor.  Continue taking medications as prescribed.  You might consider a second opinion from an orthopedic.

## 2022-11-15 ENCOUNTER — Encounter (HOSPITAL_COMMUNITY): Payer: Self-pay | Admitting: Family Medicine

## 2022-11-18 ENCOUNTER — Other Ambulatory Visit: Payer: Self-pay | Admitting: Family Medicine

## 2022-11-18 DIAGNOSIS — Z1231 Encounter for screening mammogram for malignant neoplasm of breast: Secondary | ICD-10-CM

## 2022-11-18 DIAGNOSIS — M25561 Pain in right knee: Secondary | ICD-10-CM

## 2022-11-18 DIAGNOSIS — N3941 Urge incontinence: Secondary | ICD-10-CM

## 2022-12-05 ENCOUNTER — Other Ambulatory Visit: Payer: Self-pay | Admitting: Family Medicine

## 2022-12-05 ENCOUNTER — Ambulatory Visit
Admission: RE | Admit: 2022-12-05 | Discharge: 2022-12-05 | Disposition: A | Discharge status: Home / Self Care | Payer: Medicare HMO | Source: Ambulatory Visit | Attending: Family Medicine | Admitting: Family Medicine

## 2022-12-05 DIAGNOSIS — N3941 Urge incontinence: Secondary | ICD-10-CM

## 2022-12-05 DIAGNOSIS — M25561 Pain in right knee: Secondary | ICD-10-CM

## 2023-06-09 ENCOUNTER — Encounter (HOSPITAL_COMMUNITY): Payer: Self-pay

## 2023-06-09 ENCOUNTER — Ambulatory Visit (HOSPITAL_COMMUNITY)
Admission: EM | Admit: 2023-06-09 | Discharge: 2023-06-09 | Disposition: A | Discharge status: Home / Self Care | Payer: Medicare HMO | Attending: Emergency Medicine | Admitting: Emergency Medicine

## 2023-06-09 ENCOUNTER — Telehealth (HOSPITAL_COMMUNITY): Payer: Self-pay

## 2023-06-09 DIAGNOSIS — N3090 Cystitis, unspecified without hematuria: Secondary | ICD-10-CM | POA: Diagnosis present

## 2023-06-09 LAB — POCT URINALYSIS DIP (MANUAL ENTRY)
Glucose, UA: NEGATIVE mg/dL
Nitrite, UA: NEGATIVE
Spec Grav, UA: 1.02 (ref 1.010–1.025)
Urobilinogen, UA: 1 E.U./dL
pH, UA: 6 (ref 5.0–8.0)

## 2023-06-09 MED ORDER — SULFAMETHOXAZOLE-TRIMETHOPRIM 800-160 MG PO TABS
1.0000 | ORAL_TABLET | Freq: Two times a day (BID) | ORAL | 0 refills | Status: AC
Start: 2023-06-09 — End: 2023-06-12

## 2023-06-09 MED ORDER — SULFAMETHOXAZOLE-TRIMETHOPRIM 800-160 MG PO TABS: 1.0000 | ORAL_TABLET | Freq: Two times a day (BID) | ORAL | 0 refills | Status: DC

## 2023-06-09 NOTE — ED Provider Notes (Signed)
[facility]    CSN: [id number] Arrival date & time: 06/09/23  1902    HISTORY   Chief Complaint  Patient presents with   Joint Pain    multiple joints   HPI [name] is a pleasant, 72 y.o. female who presents to urgent care today. Patient reports a history of fibromyalgia and multiple joint pain.  Patient states she currently takes oxycodone, Robaxin and Voltaren gel which usually helps but is not working at this time.  Patient states she is here to see whether or not she may have a UTI which is causing her to feel worse than normal.  The history is provided by the patient.   Past Medical History:  Diagnosis Date   Ankle edema BILATERAL   takes Lasix daily   Arthritis    BACK, KNEE, HIPS   Asthma    Cancer (HCC)    Cataracts, bilateral    immature   Chronic back pain    d/t injury in 1995   Chronic bronchitis    Chronic pain syndrome    COPD (chronic obstructive pulmonary disease) (HCC) CHRONIC BRONCHITIS --  LAST BOUT  BILATERAL PNEUMONIA 2009   uses QVAR daily and Albuterol daily as needed   Diabetes mellitus without complication (HCC)    takes Glipizide and Metformin daily   Diverticulosis    Fibromyalgia    Frequency of urination    GERD (gastroesophageal reflux disease)    takes Nexium daily    H/O hiatal hernia    Headache    frequently but related to sinus   Hepatitis    hx of.Not sure if A,B,C   History of blood transfusion    no abnormal reaction noted   History of colon polyps    benign   History of gastric ulcer    History of glaucoma STATES TREATED FOR ABOUT 5 YRS  AGO  --- NO ISSUES SINCE   Hyperlipidemia    was on meds but has been off x 2 months   Hypertension    takes Atenlol and Triamtere  daily    Iron deficiency anemia    takes Iron pill daily   Joint pain    Joint swelling    Nocturia    Peripheral neuropathy    takes Gabapentin daily   Pneumonia 2010   hx of   PTSD (post-traumatic stress disorder)    hx of-no  meds   Seasonal allergies    takes Zyrtec daily and uses Flonase daily as needed   Stress incontinence, female    Urgency of urination    Vitamin D deficiency    takes Vit D daily   Patient Active Problem List   Diagnosis Date Noted   Claudication in peripheral vascular disease (HCC) 11/10/2020   Bilateral lower extremity edema 11/10/2020   Thoracic aortic aneurysm without rupture (HCC) 11/10/2020   Aortic atherosclerosis (HCC) 09/07/2020   Chest pain 01/18/2014   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    History of Bell's palsy    Pelvic pain in female    Stress incontinence, female    Nocturia    History of glaucoma    Uterine fibroid    Hyperlipidemia    COPD (chronic obstructive pulmonary disease) (HCC)    Iron deficiency anemia    H/O hiatal hernia    Fibromyalgia    Arthritis    Chronic renal insufficiency    Diabetes mellitus with coincident hypertension (HCC)    Past  Surgical History:  Procedure Laterality Date   ABDOMINAL HYSTERECTOMY     July 2013   BILATERAL BENIGN BREAST TUMOR REMOVED  YRS AGO   BREAST SURGERY     2 cyst removed from right breast and tumor removed from left breast   CARDIAC CATHETERIZATION  09-09-2008   NORMAL CORONARY ARTERIES/ NORMAL LVSF/ CHRONIC RENAL INSUFF.   COLONOSCOPY     CYSTO WITH HYDRODISTENSION  01/07/2012   Procedure: CYSTOSCOPY/HYDRODISTENSION;  Surgeon: Martina Sinner, MD;  Location: [facility];  Service: Urology;  Laterality: N/A;   OF BLADDER WITH INSTILLATION OF MARCAINE AND PRYDIUM   MUSCLE BIOPSY Left 10/04/2015   Procedure: LEFT Vastus Lateralis Muscle Biopsy;  Surgeon: Coletta Memos, MD;  Location: [facility];  Service: Neurosurgery;  Laterality: Left;  LEFT vastus lateralis muscle biopsy   neck cyst     TUBAL LIGATION  YRS AGO   OB History     Gravida  5   Para  5   Term  5   Preterm      AB      Living  5      SAB      IAB      Ectopic      Multiple      Live Births              Home Medications    Prior to Admission medications   Medication Sig Start Date End Date Taking? Authorizing Provider  albuterol (PROVENTIL HFA;VENTOLIN HFA) 108 (90 BASE) MCG/ACT inhaler Inhale 2 puffs into the lungs every 6 (six) hours as needed for wheezing or shortness of breath. Shortness of breath   Yes [provider]  aspirin-acetaminophen-caffeine (EXCEDRIN MIGRAINE) [phone] MG tablet Take 2 tablets by mouth every 6 (six) hours as needed for pain.   Yes [provider]  atenolol (TENORMIN) 100 MG tablet Take 100 mg by mouth daily.   Yes [provider]  cetirizine (ZYRTEC) 10 MG tablet Take 10 mg by mouth daily.   Yes [provider]  Cholecalciferol (VITAMIN D) 2000 UNITS CAPS Take 2,000 Units by mouth daily.   Yes [provider]  diclofenac Sodium (VOLTAREN) 1 % GEL Apply topically as needed. 04/19/22  Yes [provider]  diphenhydrAMINE (BENADRYL) 25 mg capsule Take 25 mg by mouth every 6 (six) hours as needed.   Yes [provider]  esomeprazole (NEXIUM) 40 MG capsule Take 40 mg by mouth daily before breakfast.   Yes [provider]  Ferrous Gluconate (IRON) 240 (27 FE) MG TABS Take 240 mg by mouth daily.    Yes [provider]  fluticasone (FLONASE) 50 MCG/ACT nasal spray Place 2 sprays into the nose as needed for allergies. congestion   Yes [provider]  gabapentin (NEURONTIN) 600 MG tablet Take 600 mg by mouth 3 (three) times daily.   Yes [provider]  ibuprofen (ADVIL,MOTRIN) 600 MG tablet Take 600 mg by mouth every 6 (six) hours as needed for headache.   Yes [provider]  JANUVIA 100 MG tablet Take 100 mg by mouth daily. 04/19/22  Yes [provider]  methocarbamol (ROBAXIN) 750 MG tablet Take 750 mg by mouth as needed. 06/13/20  Yes [provider]  Multiple Vitamin (MULTIVITAMIN WITH MINERALS) TABS tablet Take 1 tablet by mouth daily.    Yes [provider]  Oxycodone HCl 20 MG TABS Take 20 mg by mouth every 4 (four) hours. 09/12/17  Yes [provider]  triamterene-hydrochlorothiazide (MAXZIDE-25) 37.5-25 MG per tablet Take 1 tablet by mouth daily.  01/13/14  Yes [provider]  beclomethasone (QVAR) 80 MCG/ACT inhaler Inhale into the lungs as needed.    [provider]  cyclobenzaprine (FEXMID) 7.5 MG tablet Take 7.5 mg by mouth 3 (three) times daily. 04/19/22   [provider]  diclofenac (CATAFLAM) 50 MG tablet Take by mouth in the morning and at bedtime. 04/19/22   [provider]  estradiol (ESTRACE) 0.1 MG/GM vaginal cream Place vaginally at bedtime. 01/07/22   [provider]  glipiZIDE (GLUCOTROL XL) 10 MG 24 hr tablet Take by mouth. Take 1 tablet (10mg ) by mouth when your blood sugar is over 200    [provider]  glucose blood (ACCU-CHEK AVIVA PLUS) test strip  08/30/16   [provider]  lidocaine (XYLOCAINE) 2 % solution Use as directed 15 mLs in the mouth or throat as needed for mouth pain. 09/21/17   Kellie Shropshire, PA-C  SYMBICORT 160-4.5 MCG/ACT inhaler  04/04/20 11/21/20  [provider]    Family History Family History  Problem Relation Age of Onset   Cancer Mother    Diabetes Mother    Hypertension Mother    Rheum arthritis Father    Cancer Sister    Hypertension Sister    Hypertension Son    Heart Problems Maternal Grandfather    Social History Social History   Tobacco Use   Smoking status: Former    Packs/day: 1.00    Years: 27.00    Additional pack years: 0.00    Total pack years: 27.00    Types: Cigarettes   Smokeless tobacco: Never   Tobacco comments:    quit smoking in 1993  Substance Use Topics   Alcohol use: No    Comment: quit drinking 25+yrs ago   Drug use: No    Comment: nothing in 25+yrs    Allergies   Avapro [irbesartan], Codeine, Lisinopril, Statins, and Latex  Review of  Systems Review of Systems Pertinent findings revealed after performing a 14 point review of systems has been noted in the history of present illness.  Physical Exam Vital Signs BP (!) 163/92 (BP Location: Right Arm)   Pulse 71   Temp 98.1 F (36.7 C) (Oral)   Resp 16   Ht 5\' 10"  (1.778 m)   Wt 245 lb (111.1 kg)   SpO2 99%   BMI 35.15 kg/m   No data found.  Physical Exam Vitals and nursing note reviewed.  Constitutional:      General: She is not in acute distress.    Appearance: Normal appearance. She is not ill-appearing.  HENT:     Head: Normocephalic and atraumatic.  Eyes:     General: Lids are normal.        Right eye: No discharge.        Left eye: No discharge.     Extraocular Movements: Extraocular movements intact.     Conjunctiva/sclera: Conjunctivae normal.     Right eye: Right conjunctiva is not injected.     Left eye: Left conjunctiva is not injected.     Pupils: Pupils are equal, round, and reactive to light.  Neck:     Trachea: Trachea and phonation normal.  Cardiovascular:     Rate and Rhythm: Normal rate and regular rhythm.     Pulses: Normal pulses.     Heart sounds: Normal heart sounds. No murmur heard.  No friction rub. No gallop.  Pulmonary:     Effort: Pulmonary effort is normal. No accessory muscle usage, prolonged expiration or respiratory distress.     Breath sounds: Normal breath sounds. No stridor, decreased air movement or transmitted upper airway sounds. No decreased breath sounds, wheezing, rhonchi or rales.  Chest:     Chest wall: No tenderness.  Abdominal:     General: Abdomen is flat. Bowel sounds are normal.     Palpations: Abdomen is soft.  Musculoskeletal:        General: Normal range of motion.     Cervical back: Normal range of motion and neck supple. Normal range of motion.  Lymphadenopathy:     Cervical: No cervical adenopathy.  Skin:    General: Skin is warm and dry.     Findings: No erythema or rash.  Neurological:      General: No focal deficit present.     Mental Status: She is alert and oriented to person, place, and time. Mental status is at baseline.  Psychiatric:        Mood and Affect: Mood normal.        Behavior: Behavior normal.        Thought Content: Thought content normal.        Judgment: Judgment normal.     Visual Acuity Right Eye Distance:   Left Eye Distance:   Bilateral Distance:    Right Eye Near:   Left Eye Near:    Bilateral Near:     UC Couse / Diagnostics / Procedures:     Radiology No results found.  Procedures Procedures (including critical care time) EKG  Pending results:  Labs Reviewed  POCT URINALYSIS DIP (MANUAL ENTRY) - Abnormal; Notable for the following components:      Result Value   Bilirubin, UA small (*)    Ketones, POC UA trace (5) (*)    Blood, UA trace-intact (*)    Protein Ur, POC trace (*)    Leukocytes, UA Small (1+) (*)    All other components within normal limits  URINE CULTURE    Medications Ordered in UC: Medications - No data to display  UC Diagnoses / Final Clinical Impressions(s)   I have reviewed the triage vital signs and the nursing notes.  Pertinent labs & imaging results that were available during my care of the patient were reviewed by me and considered in my medical decision making (see chart for details).    Final diagnoses:  Cystitis   {LMSTDP:27058}  {LMUTIP:27060}  Please see discharge instructions below for details of plan of care as provided to patient. ED Prescriptions     Medication Sig Dispense Auth. Provider   sulfamethoxazole-trimethoprim (BACTRIM DS) 800-160 MG tablet Take 1 tablet by mouth 2 (two) times daily for 3 days. 6 tablet Theadora Rama Scales, PA-C      PDMP not reviewed this encounter.  Disposition Upon Discharge:  Condition: stable for discharge home  Patient presented with concern for an acute illness with associated systemic symptoms and significant discomfort requiring urgent  management. In my opinion, this is a condition that a prudent lay person (someone who possesses an average knowledge of health and medicine) may potentially expect to result in complications if not addressed urgently such as respiratory distress, impairment of bodily function or dysfunction of bodily organs.   As such, the patient has been evaluated and assessed, work-up was performed and treatment was provided in alignment with urgent care protocols and evidence  based medicine.  Patient/parent/caregiver has been advised that the patient may require follow up for further testing and/or treatment if the symptoms continue in spite of treatment, as clinically indicated and appropriate.  Routine symptom specific, illness specific and/or disease specific instructions were discussed with the patient and/or caregiver at length.  Prevention strategies for avoiding STD exposure were also discussed.  The patient will follow up with their current PCP if and as advised. If the patient does not currently have a PCP we will assist them in obtaining one.   The patient may need specialty follow up if the symptoms continue, in spite of conservative treatment and management, for further workup, evaluation, consultation and treatment as clinically indicated and appropriate.  Patient/parent/caregiver verbalized understanding and agreement of plan as discussed.  All questions were addressed during visit.  Please see discharge instructions below for further details of plan.  Discharge Instructions:   Discharge Instructions      Common causes of urinary tract infections include but are not limited to holding your urine longer than you should, squatting instead of sitting down when urinating, sitting around in wet clothing such as a wet swimsuit or gym clothes too long, not emptying your bladder after having sexual intercourse, wiping from back to front instead of front to back after having a bowel movement.     Less  common causes of urinary tract infections include but are not limited to anatomical shifts in the location of your bladder or uterus causing obstruction of passage of urine from your bladder to your urethra where your urine comes out or prolapse of your rectum into your vaginal wall.  These less common causes can be evaluated by gynecologist, a urologist or subspecialist called a uro-gynecologist   The urinalysis that we performed in the clinic today was abnormal.  Urine culture will be performed per our protocol.  The result of the urine culture will be available in the next 3 to 5 days and will be posted to your MyChart account.  If there is an abnormal finding, you will be contacted by phone and advised of further treatment recommendations, if any.   You were advised to begin antibiotics today because your urinalysis is abnormal and you are having active symptoms of an acute lower urinary tract infection also known as cystitis.     Please pick up and begin taking your prescription for Bactrim DS (trimethoprim sulfamethoxazole) as soon as possible.  Please take all doses exactly as prescribed.  You can take this medication with or without food.  This medication is safe to take with your other medications.   If you receive a phone call advising you that your urine culture is negative but you begin to feel better after taking antibiotics for 24 hours, please feel free to complete the full course of antibiotics as they were likely needed and the urine culture result was false.  If your culture is negative and you do not feel any better, please return for repeat evaluation of other possible causes of your symptoms.   If you have not had complete resolution of your symptoms after completing treatment as prescribed, please return to urgent care for repeat evaluation or follow-up with your primary care provider.   Thank you for visiting urgent care today.  I appreciate the opportunity to participate in your  care.       This office note has been dictated using Teaching laboratory technician.  Unfortunately, this method of dictation can sometimes lead  to typographical or grammatical errors.  I apologize for your inconvenience in advance if this occurs.  Please do not hesitate to reach out to me if clarification is needed.

## 2023-06-09 NOTE — ED Triage Notes (Signed)
Patient has h/o fibromyalgia and has been having increased multiple joint pain over the last 3 weeks. She has fallen twice in the last couple of weeks due to weakness in her legs from the pain. Patient takes Oxycodone, Robaxin, and Voltaren gel with no relief. She has numbness in her hands and has had surgeries on both her hands. She has a h/o endometriosis.

## 2023-06-09 NOTE — Discharge Instructions (Signed)
Common causes of urinary tract infections include but are not limited to holding your urine longer than you should, squatting instead of sitting down when urinating, sitting around in wet clothing such as a wet swimsuit or gym clothes too long, not emptying your bladder after having sexual intercourse, wiping from back to front instead of front to back after having a bowel movement.     Less common causes of urinary tract infections include but are not limited to anatomical shifts in the location of your bladder or uterus causing obstruction of passage of urine from your bladder to your urethra where your urine comes out or prolapse of your rectum into your vaginal wall.  These less common causes can be evaluated by gynecologist, a urologist or subspecialist called a uro-gynecologist   The urinalysis that we performed in the clinic today was abnormal.  Urine culture will be performed per our protocol.  The result of the urine culture will be available in the next 3 to 5 days and will be posted to your MyChart account.  If there is an abnormal finding, you will be contacted by phone and advised of further treatment recommendations, if any.   You were advised to begin antibiotics today because your urinalysis is abnormal and you are having active symptoms of an acute lower urinary tract infection also known as cystitis.     Please pick up and begin taking your prescription for Bactrim DS (trimethoprim sulfamethoxazole) as soon as possible.  Please take all doses exactly as prescribed.  You can take this medication with or without food.  This medication is safe to take with your other medications.   If you receive a phone call advising you that your urine culture is negative but you begin to feel better after taking antibiotics for 24 hours, please feel free to complete the full course of antibiotics as they were likely needed and the urine culture result was false.  If your culture is negative and you do not  feel any better, please return for repeat evaluation of other possible causes of your symptoms.   If you have not had complete resolution of your symptoms after completing treatment as prescribed, please return to urgent care for repeat evaluation or follow-up with your primary care provider.   Thank you for visiting urgent care today.  I appreciate the opportunity to participate in your care.  

## 2023-06-10 LAB — URINE CULTURE: Culture: <10000 — AB

## 2023-09-21 ENCOUNTER — Encounter (HOSPITAL_COMMUNITY): Payer: Self-pay

## 2023-09-21 ENCOUNTER — Emergency Department (HOSPITAL_COMMUNITY)
Admission: EM | Admit: 2023-09-21 | Discharge: 2023-09-21 | Disposition: A | Discharge status: Home / Self Care | Payer: Medicare HMO | Attending: Emergency Medicine | Admitting: Emergency Medicine

## 2023-09-21 ENCOUNTER — Other Ambulatory Visit: Payer: Self-pay

## 2023-09-21 DIAGNOSIS — Z7984 Long term (current) use of oral hypoglycemic drugs: Secondary | ICD-10-CM | POA: Insufficient documentation

## 2023-09-21 DIAGNOSIS — E119 Type 2 diabetes mellitus without complications: Secondary | ICD-10-CM | POA: Diagnosis not present

## 2023-09-21 DIAGNOSIS — R109 Unspecified abdominal pain: Secondary | ICD-10-CM | POA: Diagnosis present

## 2023-09-21 DIAGNOSIS — R10819 Abdominal tenderness, unspecified site: Secondary | ICD-10-CM | POA: Diagnosis not present

## 2023-09-21 DIAGNOSIS — J449 Chronic obstructive pulmonary disease, unspecified: Secondary | ICD-10-CM | POA: Insufficient documentation

## 2023-09-21 DIAGNOSIS — Z91040 Latex allergy status: Secondary | ICD-10-CM | POA: Insufficient documentation

## 2023-09-21 DIAGNOSIS — J45909 Unspecified asthma, uncomplicated: Secondary | ICD-10-CM | POA: Insufficient documentation

## 2023-09-21 DIAGNOSIS — I10 Essential (primary) hypertension: Secondary | ICD-10-CM | POA: Diagnosis not present

## 2023-09-21 LAB — CBC WITH DIFFERENTIAL/PLATELET
Abs Immature Granulocytes: 0.01 10*3/uL (ref 0.00–0.07)
Basophils Absolute: 0 10*3/uL (ref 0.0–0.1)
Basophils Relative: 1 %
Eosinophils Absolute: 0.1 10*3/uL (ref 0.0–0.5)
Eosinophils Relative: 2 %
HCT: 35.6 % — ABNORMAL LOW (ref 36.0–46.0)
Hemoglobin: 11.3 g/dL — ABNORMAL LOW (ref 12.0–15.0)
Immature Granulocytes: 0 %
Lymphocytes Relative: 41 %
Lymphs Abs: 1.9 10*3/uL (ref 0.7–4.0)
MCH: 29 pg (ref 26.0–34.0)
MCHC: 31.7 g/dL (ref 30.0–36.0)
MCV: 91.3 fL (ref 80.0–100.0)
Monocytes Absolute: 0.5 10*3/uL (ref 0.1–1.0)
Monocytes Relative: 10 %
Neutro Abs: 2.2 10*3/uL (ref 1.7–7.7)
Neutrophils Relative %: 46 %
Platelets: 177 10*3/uL (ref 150–400)
RBC: 3.9 MIL/uL (ref 3.87–5.11)
RDW: 14.5 % (ref 11.5–15.5)
WBC: 4.8 10*3/uL (ref 4.0–10.5)
nRBC: 0 % (ref 0.0–0.2)

## 2023-09-21 LAB — COMPREHENSIVE METABOLIC PANEL
ALT: 18 U/L (ref 0–44)
AST: 24 U/L (ref 15–41)
Albumin: 3.9 g/dL (ref 3.5–5.0)
Alkaline Phosphatase: 71 U/L (ref 38–126)
Anion gap: 8 (ref 5–15)
BUN: 23 mg/dL (ref 8–23)
CO2: 30 mmol/L (ref 22–32)
Calcium: 9.1 mg/dL (ref 8.9–10.3)
Chloride: 101 mmol/L (ref 98–111)
Creatinine, Ser: 0.79 mg/dL (ref 0.44–1.00)
GFR, Estimated: >60 mL/min (ref >60)
Glucose, Bld: 104 mg/dL — ABNORMAL HIGH (ref 70–99)
Potassium: 3.8 mmol/L (ref 3.5–5.1)
Sodium: 139 mmol/L (ref 135–145)
Total Bilirubin: 0.4 mg/dL (ref 0.3–1.2)
Total Protein: 7.7 g/dL (ref 6.5–8.1)

## 2023-09-21 LAB — URINALYSIS, W/ REFLEX TO CULTURE (INFECTION SUSPECTED)
Bacteria, UA: NONE SEEN
Bilirubin Urine: NEGATIVE
Glucose, UA: NEGATIVE mg/dL
Hgb urine dipstick: NEGATIVE
Ketones, ur: NEGATIVE mg/dL
Leukocytes,Ua: NEGATIVE
Nitrite: NEGATIVE
Protein, ur: NEGATIVE mg/dL
Specific Gravity, Urine: 1.018 (ref 1.005–1.030)
pH: 5 (ref 5.0–8.0)

## 2023-09-21 LAB — LIPASE, BLOOD: Lipase: 19 U/L (ref 11–51)

## 2023-09-21 MED ORDER — LIDOCAINE VISCOUS HCL 2 % MT SOLN: 15.0000 mL | Freq: Once | OROMUCOSAL | Status: DC

## 2023-09-21 MED ORDER — ALUM & MAG HYDROXIDE-SIMETH 200-200-20 MG/5ML PO SUSP
30.0000 mL | Freq: Once | ORAL | Status: AC
  Administered 2023-09-21: 30 mL via ORAL
  Filled 2023-09-21: qty 30

## 2023-09-21 MED ORDER — DICYCLOMINE HCL 20 MG PO TABS: 20.0000 mg | ORAL_TABLET | Freq: Two times a day (BID) | ORAL | 0 refills | Status: AC

## 2023-09-21 MED ORDER — HYOSCYAMINE SULFATE 0.125 MG SL SUBL
0.1250 mg | SUBLINGUAL_TABLET | Freq: Once | SUBLINGUAL | Status: AC
  Administered 2023-09-21: 0.125 mg via ORAL
  Filled 2023-09-21: qty 1

## 2023-09-21 MED ORDER — KETOROLAC TROMETHAMINE 15 MG/ML IJ SOLN
15.0000 mg | Freq: Once | INTRAMUSCULAR | Status: AC
  Administered 2023-09-21: 15 mg via INTRAVENOUS
  Filled 2023-09-21: qty 1

## 2023-09-21 MED ORDER — FENTANYL CITRATE PF 50 MCG/ML IJ SOSY
50.0000 ug | PREFILLED_SYRINGE | Freq: Once | INTRAMUSCULAR | Status: AC
  Administered 2023-09-21: 50 ug via INTRAVENOUS
  Filled 2023-09-21: qty 1

## 2023-09-21 MED ORDER — SODIUM CHLORIDE 0.9 % IV BOLUS
1000.0000 mL | Freq: Once | INTRAVENOUS | Status: AC
  Administered 2023-09-21: 1000 mL via INTRAVENOUS

## 2023-09-21 MED ORDER — HYOSCYAMINE SULFATE 0.125 MG PO TABS
0.1250 mg | ORAL_TABLET | Freq: Once | ORAL | Status: DC
Start: 2023-09-21 — End: 2023-09-21

## 2023-09-21 MED ORDER — PROCHLORPERAZINE EDISYLATE 10 MG/2ML IJ SOLN
10.0000 mg | Freq: Once | INTRAMUSCULAR | Status: AC
  Administered 2023-09-21: 10 mg via INTRAVENOUS
  Filled 2023-09-21: qty 2

## 2023-09-21 NOTE — ED Triage Notes (Signed)
BIB EMS/ chronic pain, sees pain clinic, this pain is different and worse than normal/ denies N/V/D/ pt states she cannot get in with pain clinic until 10/03/23/

## 2023-09-21 NOTE — ED Provider Notes (Signed)
[facility] Provider Note  CSN: [id number] Arrival date & time: 09/21/23 0204  Chief Complaint(s) Abdominal Pain  HPI [name] is a 72 y.o. female with a past medical history listed below who presents to the emergency department with several weeks of intermittent abdominal discomfort mostly in the right abdomen that became worse this evening.  No fevers or chills.  No nausea or vomiting.  Pain not controlled with her home narcotic medicine.  Patient has a history of overactive bladder and started on Gemtesa.  By gynecology last month.  States at that time she was told she had a urinary tract infection and given 3 days of antibiotics.    The history is provided by the patient.    Past Medical History Past Medical History:  Diagnosis Date   Ankle edema BILATERAL   takes Lasix daily   Arthritis    BACK, KNEE, HIPS   Asthma    Cancer (HCC)    Cataracts, bilateral    immature   Chronic back pain    d/t injury in 1995   Chronic bronchitis    Chronic pain syndrome    COPD (chronic obstructive pulmonary disease) (HCC) CHRONIC BRONCHITIS --  LAST BOUT  BILATERAL PNEUMONIA 2009   uses QVAR daily and Albuterol daily as needed   Diabetes mellitus without complication (HCC)    takes Glipizide and Metformin daily   Diverticulosis    Fibromyalgia    Frequency of urination    GERD (gastroesophageal reflux disease)    takes Nexium daily    H/O hiatal hernia    Headache    frequently but related to sinus   Hepatitis    hx of.Not sure if A,B,C   History of blood transfusion    no abnormal reaction noted   History of colon polyps    benign   History of gastric ulcer    History of glaucoma STATES TREATED FOR ABOUT 5 YRS  AGO  --- NO ISSUES SINCE   Hyperlipidemia    was on meds but has been off x 2 months   Hypertension    takes Atenlol and Triamtere  daily    Iron deficiency anemia    takes Iron pill daily   Joint pain    Joint  swelling    Nocturia    Peripheral neuropathy    takes Gabapentin daily   Pneumonia 2010   hx of   PTSD (post-traumatic stress disorder)    hx of-no meds   Seasonal allergies    takes Zyrtec daily and uses Flonase daily as needed   Stress incontinence, female    Urgency of urination    Vitamin D deficiency    takes Vit D daily   Patient Active Problem List   Diagnosis Date Noted   Claudication in peripheral vascular disease (HCC) 11/10/2020   Bilateral lower extremity edema 11/10/2020   Thoracic aortic aneurysm without rupture (HCC) 11/10/2020   Aortic atherosclerosis (HCC) 09/07/2020   Chest pain 01/18/2014   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 Authorizing Provider  dicyclomine (BENTYL) 20 MG tablet Take 1 tablet (20 mg total) by mouth 2 (two) times daily. 09/21/23  Yes Calianne Larue, Amadeo Garnet, MD  albuterol (PROVENTIL HFA;VENTOLIN HFA) 108 (90 BASE) MCG/ACT inhaler Inhale 2 puffs into the lungs every 6 (six) hours as needed for wheezing or shortness of breath. Shortness of breath    [provider]  aspirin-acetaminophen-caffeine (EXCEDRIN MIGRAINE) [phone] MG tablet Take 2 tablets by mouth every 6 (six) hours as needed for pain.    [provider]  atenolol (TENORMIN) 100 MG tablet Take 100 mg by mouth daily.    [provider]  beclomethasone (QVAR) 80 MCG/ACT inhaler Inhale into the lungs as needed.    [provider]  cetirizine (ZYRTEC) 10 MG tablet Take 10 mg by mouth  daily.    [provider]  Cholecalciferol (VITAMIN D) 2000 UNITS CAPS Take 2,000 Units by mouth daily.    [provider]  cyclobenzaprine (FEXMID) 7.5 MG tablet Take 7.5 mg by mouth 3 (three) times daily. 04/19/22   [provider]  diclofenac (CATAFLAM) 50 MG tablet Take by mouth in the morning and at bedtime. 04/19/22   [provider]  diclofenac Sodium (VOLTAREN) 1 % GEL Apply topically as needed. 04/19/22   [provider]  diphenhydrAMINE (BENADRYL) 25 mg capsule Take 25 mg by mouth every 6 (six) hours as needed.    [provider]  esomeprazole (NEXIUM) 40 MG capsule Take 40 mg by mouth daily before breakfast.    [provider]  estradiol (ESTRACE) 0.1 MG/GM vaginal cream Place vaginally at bedtime. 01/07/22   [provider]  Ferrous Gluconate (IRON) 240 (27 FE) MG TABS Take 240 mg by mouth daily.     [provider]  fluticasone (FLONASE) 50 MCG/ACT nasal spray Place 2 sprays into the nose as needed for allergies. congestion    [provider]  gabapentin (NEURONTIN) 600 MG tablet Take 600 mg by mouth 3 (three) times daily.    [provider]  glipiZIDE (GLUCOTROL XL) 10 MG 24 hr tablet Take by mouth. Take 1 tablet (10mg ) by mouth when your blood sugar is over 200    [provider]  glucose blood (ACCU-CHEK AVIVA PLUS) test strip  08/30/16   [provider]  ibuprofen (ADVIL,MOTRIN) 600 MG tablet Take 600 mg by mouth every 6 (six) hours as needed for headache.    [provider]  JANUVIA 100 MG tablet Take 100 mg by mouth daily. 04/19/22   [provider]  lidocaine (XYLOCAINE) 2 % solution Use as directed 15 mLs in the mouth or throat as needed for mouth pain. 09/21/17   Kellie Shropshire, PA-C  methocarbamol (ROBAXIN) 750 MG tablet Take 750 mg by mouth as needed. 06/13/20   [provider]  Multiple Vitamin (MULTIVITAMIN WITH MINERALS) TABS tablet  Take 1 tablet by mouth daily.    [provider]  Oxycodone HCl 20 MG TABS Take 20 mg by mouth every 4 (four) hours. 09/12/17   [provider]  triamterene-hydrochlorothiazide (MAXZIDE-25) 37.5-25 MG per tablet Take 1 tablet by mouth daily.  01/13/14   [provider]  SYMBICORT 160-4.5 MCG/ACT inhaler  04/04/20 11/21/20  [provider]  Allergies Avapro [irbesartan], Codeine, Lisinopril, Statins, and Latex  Review of Systems Review of Systems As noted in HPI  Physical Exam Vital Signs  I have reviewed the triage vital signs BP (!) 163/97 (BP Location: Left Arm)   Pulse 82   Temp (!) 97.5 F (36.4 C) (Oral)   Resp 20   Ht 5\' 10"  (1.778 m)   Wt 111.6 kg   SpO2 96%   BMI 35.30 kg/m   Physical Exam Vitals reviewed.  Constitutional:      General: She is not in acute distress.    Appearance: She is well-developed. She is not diaphoretic.  HENT:     Head: Normocephalic and atraumatic.     Right Ear: External ear normal.     Left Ear: External ear normal.     Nose: Nose normal.  Eyes:     General: No scleral icterus.    Conjunctiva/sclera: Conjunctivae normal.  Neck:     Trachea: Phonation normal.  Cardiovascular:     Rate and Rhythm: Normal rate and regular rhythm.  Pulmonary:     Effort: Pulmonary effort is normal. No respiratory distress.     Breath sounds: No stridor.  Abdominal:     General: There is no distension.     Tenderness: There is abdominal tenderness in the right upper quadrant and right lower quadrant. There is no guarding or rebound.     Hernia: No hernia is present.  Musculoskeletal:        General: Normal range of motion.     Cervical back: Normal range of motion.  Neurological:     Mental Status: She is alert and oriented to person, place, and time.  Psychiatric:        Behavior:  Behavior normal.     ED Results and Treatments Labs (all labs ordered are listed, but only abnormal results are displayed) Labs Reviewed  COMPREHENSIVE METABOLIC PANEL - Abnormal; Notable for the following components:      Result Value   Glucose, Bld 104 (*)    All other components within normal limits  CBC WITH DIFFERENTIAL/PLATELET - Abnormal; Notable for the following components:   Hemoglobin 11.3 (*)    HCT 35.6 (*)    All other components within normal limits  LIPASE, BLOOD  URINALYSIS, W/ REFLEX TO CULTURE (INFECTION SUSPECTED)                                                                                                                         EKG  EKG Interpretation Date/Time:    Ventricular Rate:    PR Interval:    QRS Duration:    QT Interval:    QTC Calculation:   R Axis:      Text Interpretation:         Radiology No results found.  Medications Ordered in ED Medications  alum & mag hydroxide-simeth (MAALOX/MYLANTA) 200-200-20 MG/5ML suspension 30 mL (30 mLs Oral Given 09/21/23 0501)    And  lidocaine (XYLOCAINE) 2 % viscous mouth solution 15 mL (15 mLs Oral Not Given 09/21/23 0501)  sodium chloride 0.9 % bolus 1,000 mL (0 mLs Intravenous Stopped 09/21/23 0526)  fentaNYL (SUBLIMAZE) injection 50 mcg (50 mcg Intravenous Given 09/21/23 0404)  hyoscyamine (LEVSIN SL) SL tablet 0.125 mg (0.125 mg Oral Given 09/21/23 0414)  ketorolac (TORADOL) 15 MG/ML injection 15 mg (15 mg Intravenous Given 09/21/23 0458)  prochlorperazine (COMPAZINE) injection 10 mg (10 mg Intravenous Given 09/21/23 0500)   Procedures Procedures  (including critical care time) Medical Decision Making / ED Course   Medical Decision Making Amount and/or Complexity of Data Reviewed Labs: ordered.  Risk Prescription drug management.    Right-sided abdominal pain  Will assess for intra-abdominal inflammatory/infectious process such as appendicitis, cholecystitis, biliary disease,  pancreatitis, UTI/pyelonephritis versus renal stone.  Also considering functional intestinal colic given the duration of pain.  CBC without leukocytosis.  Mild anemia.  Metabolic panel without significant electrolyte derangements or renal sufficiency.  No evidence of bili obstruction or pancreatitis.  Mild hyperglycemia without evidence of DKA.  UA without evidence of infection.  Given reassuring labs, my suspicion for serious intra-abdominal hematosis infectious process requiring advanced imaging at this time is low.  Patient provided with IV fluids, antiemetics and IV pain medicine.  Tolerating PO.  Recommend PCP/GI follow up    Final Clinical Impression(s) / ED Diagnoses Final diagnoses:  Abdominal discomfort   The patient appears reasonably screened and/or stabilized for discharge and I doubt any other medical condition or other [facility] requiring further screening, evaluation, or treatment in the ED at this time. I have discussed the findings, Dx and Tx plan with the patient/family who expressed understanding and agree(s) with the plan. Discharge instructions discussed at length. The patient/family was given strict return precautions who verbalized understanding of the instructions. No further questions at time of discharge.  Disposition: Discharge  Condition: Good  ED Discharge Orders          Ordered    dicyclomine (BENTYL) 20 MG tablet  2 times daily        09/21/23 1610             Follow Up: Ellyn Hack, MD [address] [place] [address] [phone]  Call  to schedule an appointment for close follow up  Charna Elizabeth, MD [address] [place] [address] [phone]  Call  to schedule an appointment for close follow up    This chart was dictated using voice recognition software.  Despite best efforts to proofread,  errors can occur which can change the documentation meaning.    Nira Conn, MD 09/21/23 [phone]

## 2023-09-21 NOTE — ED Notes (Signed)
Pt is stating that her pain starts in her ABD and radiates to lower back, and down her legs

## 2024-02-05 ENCOUNTER — Ambulatory Visit (INDEPENDENT_AMBULATORY_CARE_PROVIDER_SITE_OTHER): Payer: Medicare HMO

## 2024-02-05 ENCOUNTER — Encounter (HOSPITAL_COMMUNITY): Payer: Self-pay

## 2024-02-05 ENCOUNTER — Ambulatory Visit (HOSPITAL_COMMUNITY)
Admission: EM | Admit: 2024-02-05 | Discharge: 2024-02-05 | Disposition: A | Discharge status: Home / Self Care | Payer: Medicare HMO | Attending: Emergency Medicine | Admitting: Emergency Medicine

## 2024-02-05 DIAGNOSIS — M25561 Pain in right knee: Secondary | ICD-10-CM | POA: Diagnosis not present

## 2024-02-05 DIAGNOSIS — M170 Bilateral primary osteoarthritis of knee: Secondary | ICD-10-CM | POA: Diagnosis not present

## 2024-02-05 DIAGNOSIS — M25562 Pain in left knee: Secondary | ICD-10-CM | POA: Diagnosis not present

## 2024-02-05 MED ORDER — ACETAMINOPHEN 500 MG PO TABS
1000.0000 mg | ORAL_TABLET | Freq: Three times a day (TID) | ORAL | 2 refills | Status: AC
Start: 2024-02-05 — End: 2024-05-05

## 2024-02-05 MED ORDER — DICLOFENAC POTASSIUM 50 MG PO TABS: 50.0000 mg | ORAL_TABLET | Freq: Two times a day (BID) | ORAL | 2 refills | Status: AC

## 2024-02-05 NOTE — ED Provider Notes (Signed)
[facility]    CSN: [id number] Arrival date & time: 02/05/24  1840    HISTORY   Chief Complaint  Patient presents with   Knee Pain   HPI [name] is a pleasant, 73 y.o. female who presents to urgent care today. Taking the oxycodone, has diclofenac prescribed but does not think she is taking it.  Hears crunch crunch every time she walks.  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  The history is provided by the patient.   Past Medical History:  Diagnosis Date   Ankle edema BILATERAL   takes Lasix daily   Arthritis    BACK, KNEE, HIPS   Asthma    Cancer (HCC)    Cataracts, bilateral    immature   Chronic back pain    d/t injury in 1995   Chronic bronchitis    Chronic pain syndrome    COPD (chronic obstructive pulmonary disease) (HCC) CHRONIC BRONCHITIS --  LAST BOUT  BILATERAL PNEUMONIA 2009   uses QVAR daily and Albuterol daily as needed   Diabetes mellitus without complication (HCC)    takes Glipizide and Metformin daily   Diverticulosis    Fibromyalgia    Frequency of urination    GERD (gastroesophageal reflux disease)    takes Nexium daily    H/O hiatal hernia    Headache    frequently but related to sinus   Hepatitis    hx of.Not sure if A,B,C   History of blood transfusion    no abnormal reaction noted   History of colon polyps    benign   History of gastric ulcer    History of glaucoma STATES TREATED FOR ABOUT 5 YRS  AGO  --- NO ISSUES SINCE   Hyperlipidemia    was on meds but has been off x 2 months   Hypertension    takes Atenlol and Triamtere  daily    Iron deficiency anemia    takes Iron pill daily   Joint pain    Joint swelling    Nocturia    Peripheral neuropathy    takes Gabapentin daily   Pneumonia 2010   hx of   PTSD (post-traumatic stress disorder)    hx of-no meds   Seasonal allergies    takes Zyrtec daily and uses Flonase daily as needed   Stress incontinence, female    Urgency of urination     Vitamin D deficiency    takes Vit D daily   Patient Active Problem List   Diagnosis Date Noted   Claudication in peripheral vascular disease (HCC) 11/10/2020   Bilateral lower extremity edema 11/10/2020   Thoracic aortic aneurysm without rupture (HCC) 11/10/2020   Aortic atherosclerosis (HCC) 09/07/2020   Chest pain 01/18/2014   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    History of Bell's palsy    Pelvic pain in female    Stress incontinence, female    Nocturia    History of glaucoma    Uterine fibroid    Hyperlipidemia    COPD (chronic obstructive pulmonary disease) (HCC)    Iron deficiency anemia    H/O hiatal hernia    Fibromyalgia    Arthritis    Chronic renal insufficiency    Diabetes mellitus with coincident hypertension [facility])    Past Surgical History:  Procedure Laterality Date   ABDOMINAL HYSTERECTOMY     July 2013   BILATERAL BENIGN BREAST TUMOR REMOVED  YRS AGO  BREAST SURGERY     2 cyst removed from right breast and tumor removed from left breast   CARDIAC CATHETERIZATION  09-09-2008   NORMAL CORONARY ARTERIES/ NORMAL LVSF/ CHRONIC RENAL INSUFF.   COLONOSCOPY     CYSTO WITH HYDRODISTENSION  01/07/2012   Procedure: CYSTOSCOPY/HYDRODISTENSION;  Surgeon: Martina Sinner, MD;  Location: [facility];  Service: Urology;  Laterality: N/A;   OF BLADDER WITH INSTILLATION OF MARCAINE AND PRYDIUM   MUSCLE BIOPSY Left 10/04/2015   Procedure: LEFT Vastus Lateralis Muscle Biopsy;  Surgeon: Coletta Memos, MD;  Location: [facility];  Service: Neurosurgery;  Laterality: Left;  LEFT vastus lateralis muscle biopsy   neck cyst     TUBAL LIGATION  YRS AGO   OB History     Gravida  5   Para  5   Term  5   Preterm      AB      Living  5      SAB      IAB      Ectopic      Multiple      Live Births             Home Medications    Prior to Admission medications   Medication Sig Start Date End Date Taking? Authorizing Provider   cyclobenzaprine (FEXMID) 7.5 MG tablet Take 7.5 mg by mouth 3 (three) times daily. 04/19/22  Yes [provider]  gabapentin (NEURONTIN) 600 MG tablet Take 600 mg by mouth 3 (three) times daily.   Yes [provider]  methocarbamol (ROBAXIN) 750 MG tablet Take 750 mg by mouth as needed. 06/13/20  Yes [provider]  Oxycodone HCl 20 MG TABS Take 20 mg by mouth every 4 (four) hours. 09/12/17  Yes [provider]  acetaminophen (TYLENOL) 500 MG tablet Take 2 tablets (1,000 mg total) by mouth every 8 (eight) hours. 02/05/24 05/05/24 Yes Theadora Rama Scales, PA-C  albuterol (PROVENTIL HFA;VENTOLIN HFA) 108 (90 BASE) MCG/ACT inhaler Inhale 2 puffs into the lungs every 6 (six) hours as needed for wheezing or shortness of breath. Shortness of breath    [provider]  aspirin-acetaminophen-caffeine (EXCEDRIN MIGRAINE) [phone] MG tablet Take 2 tablets by mouth every 6 (six) hours as needed for pain.    [provider]  atenolol (TENORMIN) 100 MG tablet Take 100 mg by mouth daily.    [provider]  beclomethasone (QVAR) 80 MCG/ACT inhaler Inhale into the lungs as needed.    [provider]  cetirizine (ZYRTEC) 10 MG tablet Take 10 mg by mouth daily.    [provider]  Cholecalciferol (VITAMIN D) 2000 UNITS CAPS Take 2,000 Units by mouth daily.    [provider]  diclofenac (CATAFLAM) 50 MG tablet Take 1 tablet (50 mg total) by mouth 2 (two) times daily. 02/05/24 05/05/24  Theadora Rama Scales, PA-C  diclofenac Sodium (VOLTAREN) 1 % GEL Apply topically as needed. 04/19/22   [provider]  dicyclomine (BENTYL) 20 MG tablet Take 1 tablet (20 mg total) by mouth 2 (two) times daily. 09/21/23   Nira Conn, MD  diphenhydrAMINE (BENADRYL) 25 mg capsule Take 25 mg by mouth every 6 (six) hours as needed.    [provider]  esomeprazole (NEXIUM) 40 MG capsule Take 40 mg by mouth daily before  breakfast.    [provider]  estradiol (ESTRACE) 0.1 MG/GM vaginal cream Place vaginally at bedtime. 01/07/22   [provider]  Ferrous Gluconate (IRON) 240 (27 FE) MG TABS Take 240 mg by mouth daily.     [provider]  fluticasone (FLONASE) 50 MCG/ACT nasal spray Place 2 sprays into the nose as needed for allergies. congestion    [provider]  glipiZIDE (GLUCOTROL XL) 10 MG 24 hr tablet Take by mouth. Take 1 tablet (10mg ) by mouth when your blood sugar is over 200    [provider]  glucose blood (ACCU-CHEK AVIVA PLUS) test strip  08/30/16   [provider]  ibuprofen (ADVIL,MOTRIN) 600 MG tablet Take 600 mg by mouth every 6 (six) hours as needed for headache.    [provider]  JANUVIA 100 MG tablet Take 100 mg by mouth daily. 04/19/22   [provider]  lidocaine (XYLOCAINE) 2 % solution Use as directed 15 mLs in the mouth or throat as needed for mouth pain. 09/21/17   Kellie Shropshire, PA-C  Multiple Vitamin (MULTIVITAMIN WITH MINERALS) TABS tablet Take 1 tablet by mouth daily.    [provider]  triamterene-hydrochlorothiazide (MAXZIDE-25) 37.5-25 MG per tablet Take 1 tablet by mouth daily.  01/13/14   [provider]  SYMBICORT 160-4.5 MCG/ACT inhaler  04/04/20 11/21/20  [provider]    Family History Family History  Problem Relation Age of Onset   Cancer Mother    Diabetes Mother    Hypertension Mother    Rheum arthritis Father    Cancer Sister    Hypertension Sister    Hypertension Son    Heart Problems Maternal Grandfather    Social History Social History   Tobacco Use   Smoking status: Former    Current packs/day: 1.00    Average packs/day: 1 pack/day for 27.0 years (27.0 ttl pk-yrs)    Types: Cigarettes   Smokeless tobacco: Never   Tobacco comments:    quit smoking in 1993  Substance Use Topics   Alcohol use: No    Comment: quit drinking 25+yrs ago   Drug use:  No    Comment: nothing in 25+yrs    Allergies   Avapro [irbesartan], Codeine, Lisinopril, Statins, and Latex  Review of Systems Review of Systems Pertinent findings revealed after performing a 14 point review of systems has been noted in the history of present illness.  Physical Exam Vital Signs BP 125/76 (BP Location: Left Arm)   Pulse 95   Temp 98.4 F (36.9 C) (Oral)   Resp 20   SpO2 97%   No data found.  Physical Exam  [facility] / Diagnostics / Procedures:     Radiology DG Knee Complete 4 Views Right Result Date: 02/05/2024 CLINICAL DATA:  Pain after fall 4 weeks ago. Known history of chronic pain as well EXAM: RIGHT KNEE - COMPLETE 4 VIEW COMPARISON:  Knee x-ray 08/06/2022. FINDINGS: No fracture or dislocation. Moderate joint space loss of lateral compartment. Small osteophytes of all 3 compartments. No joint effusion lateral view. Hyperostosis along the patella. Osteopenia. Joint space loss is progressive from prior examination. IMPRESSION: Moderate degenerative changes. Electronically Signed   By: Karen Kays M.D.   On: 02/05/2024 20:39   DG Knee Complete 4 Views Left Result Date: 02/05/2024 CLINICAL DATA:  Knee pain. Fall 4 weeks ago. History of chronic pain as well EXAM: LEFT KNEE - COMPLETE 4 VIEW COMPARISON:  None Available. FINDINGS: No fracture or dislocation. Moderate joint space loss of the medial compartment with small osteophytes. Osteophytes seen of the patellofemoral joint. No joint effusion lateral view.  Hyperostosis. No fracture or dislocation. Osteopenia IMPRESSION: Tricompartmental degenerative changes greatest of the medial compartment. Electronically Signed   By: Karen Kays M.D.   On: 02/05/2024 20:37    Procedures Procedures (including critical care time) EKG  Pending results:  Labs Reviewed - No data to display  Medications Ordered in UC: Medications - No data to display  UC Diagnoses / Final Clinical Impressions(s)   I have reviewed the  triage vital signs and the nursing notes.  Pertinent labs & imaging results that were available during my care of the patient were reviewed by me and considered in my medical decision making (see chart for details).    Final diagnoses:  Acute pain of left knee  Acute pain of right knee  Primary osteoarthritis of both knees    {LOWERBACKPAINPLAN:26747}  Please see discharge instructions below for details of plan of care as provided to patient. ED Prescriptions     Medication Sig Dispense Auth. Provider   diclofenac (CATAFLAM) 50 MG tablet Take 1 tablet (50 mg total) by mouth 2 (two) times daily. 60 tablet Theadora Rama Scales, PA-C   acetaminophen (TYLENOL) 500 MG tablet Take 2 tablets (1,000 mg total) by mouth every 8 (eight) hours. 180 tablet Theadora Rama Scales, PA-C      PDMP not reviewed this encounter.    Discharge Instructions      Once we receive the official report from the radiologist, we will contact you.  Per my personal read of your x-rays of your knees, you have severe arthritis with almost total loss of the question in between the bones.  This is also known as osteoarthritis.  The treatment for this is medication to relieve pain as well as anti-inflammatory pain medication.  I see that you are already taking oxycodone.  I have refilled your diclofenac.  The only pain reliever that I can add to your current regimen is acetaminophen 1000 mg 3 times daily.  I have sent a prescription to your pharmacy for your convenience, as well.  Please consider reaching out to Dr. Kenna Gilbert for consultation regarding total knee replacement.  He is an Presenter, broadcasting and has performed knee replacement and hip replacements on several people that I know, one of them being my husband.  Replacing your knees will provide permanent knee pain relief.  Thank you for visiting [facility] today.      Disposition Upon Discharge:  Condition: stable for discharge  home Home: take medications as prescribed; routine discharge instructions as discussed; follow up as advised.  Patient presented with an acute illness with associated systemic symptoms and significant discomfort requiring urgent management. In my opinion, this is a condition that a prudent lay person (someone who possesses an average knowledge of health and medicine) may potentially expect to result in complications if not addressed urgently such as respiratory distress, impairment of bodily function or dysfunction of bodily organs.   Routine symptom specific, illness specific and/or disease specific instructions were discussed with the patient and/or caregiver at length.   As such, the patient has been evaluated and assessed, work-up was performed and treatment was provided in alignment with urgent care protocols and evidence based medicine.  Patient/parent/caregiver has been advised that the patient may require follow up for further testing and treatment if the symptoms continue in spite of treatment, as clinically indicated and appropriate.  Patient/parent/caregiver has been advised to report to orthopedic urgent care clinic or return to the [facility] or PCP in 3-5 days  if no better; follow-up with orthopedics, PCP or the Emergency Department if new signs and symptoms develop or if the current signs or symptoms continue to change or worsen for further workup, evaluation and treatment as clinically indicated and appropriate  The patient will follow up with their current PCP if and as advised. If the patient does not currently have a PCP we will have assisted them in obtaining one.   The patient may need specialty follow up if the symptoms continue, in spite of conservative treatment and management, for further workup, evaluation, consultation and treatment as clinically indicated and appropriate.  Patient/parent/caregiver verbalized understanding and agreement of plan as discussed.  All questions were  addressed during visit.  Please see discharge instructions below for further details of plan.  This office note has been dictated using Teaching laboratory technician.  Unfortunately, this method of dictation can sometimes lead to typographical or grammatical errors.  I apologize for your inconvenience in advance if this occurs.  Please do not hesitate to reach out to me if clarification is needed.

## 2024-02-05 NOTE — Discharge Instructions (Addendum)
Once we receive the official report from the radiologist, we will contact you.  Per my personal read of your x-rays of your knees, you have severe arthritis with almost total loss of the question in between the bones.  This is also known as osteoarthritis.  The treatment for this is medication to relieve pain as well as anti-inflammatory pain medication.  I see that you are already taking oxycodone.  I have refilled your diclofenac.  The only pain reliever that I can add to your current regimen is acetaminophen 1000 mg 3 times daily.  I have sent a prescription to your pharmacy for your convenience, as well.  Please consider reaching out to Dr. Kenna Gilbert for consultation regarding total knee replacement.  He is an Presenter, broadcasting and has performed knee replacement and hip replacements on several people that I know, one of them being my husband.  Replacing your knees will provide permanent knee pain relief.  Thank you for visiting [facility] today.

## 2024-02-05 NOTE — ED Triage Notes (Signed)
Pt c/o bilateral knee pain x6 wks. Denies injury. States hx of chronic pain. States took oxycodone 20mg  aat 5:30p.

## 2024-05-10 ENCOUNTER — Ambulatory Visit (HOSPITAL_COMMUNITY)
Admission: EM | Admit: 2024-05-10 | Discharge: 2024-05-10 | Disposition: A | Discharge status: Home / Self Care | Attending: Emergency Medicine | Admitting: Emergency Medicine

## 2024-05-10 ENCOUNTER — Encounter (HOSPITAL_COMMUNITY): Payer: Self-pay

## 2024-05-10 DIAGNOSIS — R350 Frequency of micturition: Secondary | ICD-10-CM

## 2024-05-10 LAB — POCT URINALYSIS DIP (MANUAL ENTRY)
Blood, UA: NEGATIVE
Glucose, UA: NEGATIVE mg/dL
Leukocytes, UA: NEGATIVE
Nitrite, UA: NEGATIVE
Spec Grav, UA: 1.025 (ref 1.010–1.025)
Urobilinogen, UA: 0.2 U/dL
pH, UA: 5.5 (ref 5.0–8.0)

## 2024-05-10 MED ORDER — KETOROLAC TROMETHAMINE 30 MG/ML IJ SOLN
15.0000 mg | Freq: Once | INTRAMUSCULAR | Status: AC
  Administered 2024-05-10: 15 mg via INTRAMUSCULAR

## 2024-05-10 MED ORDER — KETOROLAC TROMETHAMINE 30 MG/ML IJ SOLN
INTRAMUSCULAR | Status: AC
  Filled 2024-05-10: qty 1

## 2024-05-10 NOTE — Discharge Instructions (Signed)
 We are sending your urine off for culture and we will contact you if antibiotics are needed.  Ensure you are drinking at least 64 ounces of water  daily and avoiding sodas, teas and juices.  For the raw area on long your brief lawn please keep the area clean and dry.  You can use baby powder to the area to absorb any moisture.  We have given you a Toradol  injection in clinic to help with pain.  Return to clinic for any new or urgent symptoms.

## 2024-05-10 NOTE — ED Provider Notes (Signed)
 [facility]    CSN: [id number] Arrival date & time: 05/10/24  1853      History   Chief Complaint Chief Complaint  Patient presents with   Urinary Frequency    HPI [name] is a 73 y.o. female.   Patient presents to clinic over concerns of urinary urgency and frequency that started yesterday.  She was initially concerned that she may be took 2 of her diuretic pills.  She does have a history of incontinence and wears a brief and a pad, she is often not able to make it to the bathroom when she feels the urge to urinate.  Has not had any dysuria, abdominal pain, nausea, vomiting or flank pain.  Denies hematuria.  Has generalized aches and pains.  Has a rash that she is concerned about to the groin area.  The history is provided by the patient and medical records.  Urinary Frequency    Past Medical History:  Diagnosis Date   Ankle edema BILATERAL   takes Lasix  daily   Arthritis    BACK, KNEE, HIPS   Asthma    Cancer (HCC)    Cataracts, bilateral    immature   Chronic back pain    d/t injury in 1995   Chronic bronchitis    Chronic pain syndrome    COPD (chronic obstructive pulmonary disease) (HCC) CHRONIC BRONCHITIS --  LAST BOUT  BILATERAL PNEUMONIA 2009   uses QVAR daily and Albuterol  daily as needed   Diabetes mellitus without complication (HCC)    takes Glipizide  and Metformin daily   Diverticulosis    Fibromyalgia    Frequency of urination    GERD (gastroesophageal reflux disease)    takes Nexium daily    H/O hiatal hernia    Headache    frequently but related to sinus   Hepatitis    hx of.Not sure if A,B,C   History of blood transfusion    no abnormal reaction noted   History of colon polyps    benign   History of gastric ulcer    History of glaucoma STATES TREATED FOR ABOUT 5 YRS  AGO  --- NO ISSUES SINCE   Hyperlipidemia    was on meds but has been off x 2 months   Hypertension    takes Atenlol and Triamtere  daily    Iron  deficiency anemia    takes Iron pill daily   Joint pain    Joint swelling    Nocturia    Peripheral neuropathy    takes Gabapentin  daily   Pneumonia 2010   hx of   PTSD (post-traumatic stress disorder)    hx of-no meds   Seasonal allergies    takes Zyrtec daily and uses Flonase  daily as needed   Stress incontinence, female    Urgency of urination    Vitamin D deficiency    takes Vit D daily    Patient Active Problem List   Diagnosis Date Noted   Claudication in peripheral vascular disease (HCC) 11/10/2020   Bilateral lower extremity edema 11/10/2020   Thoracic aortic aneurysm without rupture (HCC) 11/10/2020   Aortic atherosclerosis (HCC) 09/07/2020   Chest pain 01/18/2014   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    History of Bell's palsy    Pelvic pain in female    Stress incontinence, female    Nocturia    History of glaucoma    Uterine fibroid    Hyperlipidemia    COPD (  chronic obstructive pulmonary disease) (HCC)    Iron deficiency anemia    H/O hiatal hernia    Fibromyalgia    Arthritis    Chronic renal insufficiency    Diabetes mellitus with coincident hypertension [facility])     Past Surgical History:  Procedure Laterality Date   ABDOMINAL HYSTERECTOMY     July 2013   BILATERAL BENIGN BREAST TUMOR REMOVED  YRS AGO   BREAST SURGERY     2 cyst removed from right breast and tumor removed from left breast   CARDIAC CATHETERIZATION  09-09-2008   NORMAL CORONARY ARTERIES/ NORMAL LVSF/ CHRONIC RENAL INSUFF.   COLONOSCOPY     CYSTO WITH HYDRODISTENSION  01/07/2012   Procedure: CYSTOSCOPY/HYDRODISTENSION;  Surgeon: Devorah Fonder, MD;  Location: [facility];  Service: Urology;  Laterality: N/A;   OF BLADDER WITH INSTILLATION OF MARCAINE  AND PRYDIUM   MUSCLE BIOPSY Left 10/04/2015   Procedure: LEFT Vastus Lateralis Muscle Biopsy;  Surgeon: Audie Bleacher, MD;  Location: [facility];  Service: Neurosurgery;  Laterality: Left;  LEFT vastus lateralis muscle  biopsy   neck cyst     TUBAL LIGATION  YRS AGO    OB History     Gravida  5   Para  5   Term  5   Preterm      AB      Living  5      SAB      IAB      Ectopic      Multiple      Live Births               Home Medications    Prior to Admission medications   Medication Sig Start Date End Date Taking? Authorizing Provider  albuterol  (PROVENTIL  HFA;VENTOLIN  HFA) 108 (90 BASE) MCG/ACT inhaler Inhale 2 puffs into the lungs every 6 (six) hours as needed for wheezing or shortness of breath. Shortness of breath    [provider]  aspirin -acetaminophen -caffeine (EXCEDRIN MIGRAINE) 250-250-65 MG tablet Take 2 tablets by mouth every 6 (six) hours as needed for pain.    [provider]  atenolol  (TENORMIN ) 100 MG tablet Take 100 mg by mouth daily.    [provider]  beclomethasone (QVAR) 80 MCG/ACT inhaler Inhale into the lungs as needed.    [provider]  cetirizine (ZYRTEC) 10 MG tablet Take 10 mg by mouth daily.    [provider]  Cholecalciferol (VITAMIN D) 2000 UNITS CAPS Take 2,000 Units by mouth daily.    [provider]  cyclobenzaprine (FEXMID) 7.5 MG tablet Take 7.5 mg by mouth 3 (three) times daily. 04/19/22   [provider]  diclofenac  Sodium (VOLTAREN) 1 % GEL Apply topically as needed. 04/19/22   [provider]  dicyclomine  (BENTYL ) 20 MG tablet Take 1 tablet (20 mg total) by mouth 2 (two) times daily. 09/21/23   Lindle Rhea, MD  diphenhydrAMINE  (BENADRYL ) 25 mg capsule Take 25 mg by mouth every 6 (six) hours as needed.    [provider]  esomeprazole (NEXIUM) 40 MG capsule Take 40 mg by mouth daily before breakfast.    [provider]  estradiol (ESTRACE) 0.1 MG/GM vaginal cream Place vaginally at bedtime. 01/07/22   [provider]  Ferrous Gluconate (IRON) 240 (27 FE) MG TABS Take 240 mg by mouth daily.     [provider]  fluticasone   (FLONASE ) 50 MCG/ACT nasal spray Place 2 sprays into the nose  as needed for allergies. congestion    [provider]  gabapentin  (NEURONTIN ) 600 MG tablet Take 600 mg by mouth 3 (three) times daily.    [provider]  glipiZIDE  (GLUCOTROL  XL) 10 MG 24 hr tablet Take by mouth. Take 1 tablet (10mg ) by mouth when your blood sugar is over 200    [provider]  glucose blood (ACCU-CHEK AVIVA PLUS) test strip  08/30/16   [provider]  ibuprofen  (ADVIL ,MOTRIN ) 600 MG tablet Take 600 mg by mouth every 6 (six) hours as needed for headache.    [provider]  JANUVIA 100 MG tablet Take 100 mg by mouth daily. 04/19/22   [provider]  lidocaine  (XYLOCAINE ) 2 % solution Use as directed 15 mLs in the mouth or throat as needed for mouth pain. 09/21/17   Priscella Brooms, PA-C  methocarbamol (ROBAXIN) 750 MG tablet Take 750 mg by mouth as needed. 06/13/20   [provider]  Multiple Vitamin (MULTIVITAMIN WITH MINERALS) TABS tablet Take 1 tablet by mouth daily.    [provider]  Oxycodone  HCl 20 MG TABS Take 20 mg by mouth every 4 (four) hours. 09/12/17   [provider]  triamterene -hydrochlorothiazide  (MAXZIDE -25) 37.5-25 MG per tablet Take 1 tablet by mouth daily.  01/13/14   [provider]  SYMBICORT 160-4.5 MCG/ACT inhaler  04/04/20 11/21/20  [provider]    Family History Family History  Problem Relation Age of Onset   Cancer Mother    Diabetes Mother    Hypertension Mother    Rheum arthritis Father    Cancer Sister    Hypertension Sister    Hypertension Son    Heart Problems Maternal Grandfather     Social History Social History   Tobacco Use   Smoking status: Former    Current packs/day: 1.00    Average packs/day: 1 pack/day for 27.0 years (27.0 ttl pk-yrs)    Types: Cigarettes   Smokeless tobacco: Never   Tobacco comments:    quit smoking in 1993  Vaping Use   Vaping status:  Never Used  Substance Use Topics   Alcohol use: No    Comment: quit drinking 25+yrs ago   Drug use: No    Comment: nothing in 25+yrs      Allergies   Avapro [irbesartan], Codeine, Lisinopril, Statins, and Latex   Review of Systems Review of Systems  Per HPI  Physical Exam Triage Vital Signs ED Triage Vitals [05/10/24 2014]  Encounter Vitals Group     BP (!) 161/84     Systolic BP Percentile      Diastolic BP Percentile      Pulse Rate 69     Resp 16     Temp 98.1 F (36.7 C)     Temp Source Oral     SpO2 96 %     Weight      Height      Head Circumference      Peak Flow      Pain Score 0     Pain Loc      Pain Education      Exclude from Growth Chart    No data found.  Updated Vital Signs BP (!) 161/84 (BP Location: Left Arm)   Pulse 69   Temp 98.1 F (36.7 C) (Oral)   Resp 16   SpO2 96%   Visual Acuity Right Eye Distance:   Left Eye Distance:   Bilateral Distance:  Right Eye Near:   Left Eye Near:    Bilateral Near:     Physical Exam Vitals and nursing note reviewed.  Constitutional:      Appearance: Normal appearance.  HENT:     Head: Normocephalic and atraumatic.     Right Ear: External ear normal.     Left Ear: External ear normal.     Nose: Nose normal.     Mouth/Throat:     Mouth: Mucous membranes are moist.  Eyes:     Conjunctiva/sclera: Conjunctivae normal.  Cardiovascular:     Rate and Rhythm: Normal rate.  Pulmonary:     Effort: Pulmonary effort is normal. No respiratory distress.  Abdominal:     Tenderness: There is no right CVA tenderness or left CVA tenderness.  Musculoskeletal:        General: Normal range of motion.  Skin:    Findings: Rash present.       Neurological:     General: No focal deficit present.     Mental Status: She is alert.  Psychiatric:        Mood and Affect: Mood normal.        Behavior: Behavior is cooperative.      UC Treatments / Results  Labs (all labs ordered are listed, but only  abnormal results are displayed) Labs Reviewed  POCT URINALYSIS DIP (MANUAL ENTRY) - Abnormal; Notable for the following components:      Result Value   Color, UA straw (*)    Clarity, UA hazy (*)    Bilirubin, UA small (*)    Ketones, POC UA trace (5) (*)    Protein Ur, POC trace (*)    All other components within normal limits  URINE CULTURE    EKG   Radiology No results found.  Procedures Procedures (including critical care time)  Medications Ordered in UC Medications  ketorolac  (TORADOL ) 30 MG/ML injection 15 mg (has no administration in time range)    Initial Impression / Assessment and Plan / UC Course  I have reviewed the triage vital signs and the nursing notes.  Pertinent labs & imaging results that were available during my care of the patient were reviewed by me and considered in my medical decision making (see chart for details).  Vitals in triage reviewed, patient is hemodynamically stable.  Urinalysis without red blood cells, nitrates or leukocytes.  Negative for CVA tenderness, afebrile and without tachycardia, low concern for pyelonephritis.  Will send urine for culture to ensure no UTI.  Friction rash to the right groin area, advised to keep the area dry and apply baby powder.  Given 15 mg IM Toradol  in clinic for generalized aches pain as requested.  Plan of care, follow-up care return precautions given, no questions at this time.      Final Clinical Impressions(s) / UC Diagnoses   Final diagnoses:  Urinary frequency     Discharge Instructions      We are sending your urine off for culture and we will contact you if antibiotics are needed.  Ensure you are drinking at least 64 ounces of water  daily and avoiding sodas, teas and juices.  For the raw area on long your brief lawn please keep the area clean and dry.  You can use baby powder to the area to absorb any moisture.  We have given you a Toradol  injection in clinic to help with pain.  Return  to clinic for any new or urgent symptoms.   ED  Prescriptions   None    PDMP not reviewed this encounter.   Harlow Lighter, Srihari Shellhammer  N, FNP 05/10/24 2050

## 2024-05-10 NOTE — ED Triage Notes (Signed)
 Patient reports that she began having urinary frequency since yesterday.

## 2024-05-12 ENCOUNTER — Ambulatory Visit: Payer: Self-pay

## 2024-05-12 LAB — URINE CULTURE: Culture: NO GROWTH

## 2024-07-19 ENCOUNTER — Ambulatory Visit (HOSPITAL_COMMUNITY): Admission: EM | Admit: 2024-07-19 | Discharge: 2024-07-19 | Disposition: A | Discharge status: Home / Self Care

## 2024-07-19 ENCOUNTER — Encounter (HOSPITAL_COMMUNITY): Payer: Self-pay

## 2024-07-19 DIAGNOSIS — M170 Bilateral primary osteoarthritis of knee: Secondary | ICD-10-CM | POA: Diagnosis not present

## 2024-07-19 MED ORDER — ESOMEPRAZOLE MAGNESIUM 40 MG PO CPDR: 40.0000 mg | DELAYED_RELEASE_CAPSULE | Freq: Every day | ORAL | 2 refills | Status: AC

## 2024-07-19 MED ORDER — DICLOFENAC SODIUM 75 MG PO TBEC
75.0000 mg | DELAYED_RELEASE_TABLET | Freq: Two times a day (BID) | ORAL | 2 refills | Status: AC
Start: 2024-07-19 — End: ?

## 2024-07-19 NOTE — Discharge Instructions (Addendum)
  1. Primary osteoarthritis of both knees (Primary) - esomeprazole  (NEXIUM ) 40 MG capsule; Take 1 capsule (40 mg total) by mouth daily before breakfast.  Dispense: 30 capsule; Refill: 2 - diclofenac  (VOLTAREN ) 75 MG EC tablet; Take 1 tablet (75 mg total) by mouth 2 (two) times daily.  Dispense: 30 tablet; Refill: 2 - Follow-up with primary care provider for further evaluation and ongoing management of osteoarthritis of bilateral knees as well as other medical concerns.

## 2024-07-19 NOTE — ED Provider Notes (Signed)
 [facility]  Note:  This document was prepared using Dragon voice recognition software and may include unintentional dictation errors.  MRN: [id number] DOB: Apr 09, 1951  Subjective:   [name] is a 73 y.o. female presenting for bilateral knee pain and bilateral lower extremity swelling.  Patient reports that she has severe bilateral knee arthritis that has been managed by her primary care provider as well as pain management but she is still having significant pain and difficulty moving around as well as new onset lower extremity swelling to her ankles.  Patient has tried keeping her legs elevated when at rest using compression stockings and knows that her blood pressure being elevated can contribute to increased swelling but patient states that nothing seems to be helping.  She denies any shortness of breath, chest pain, weakness, dizziness.  She is requesting refill of diclofenac  as well as esomeprazole  for gastritis secondary to anti-inflammatory medication.  No current facility-administered medications for this encounter.  Current Outpatient Medications:    albuterol  (PROVENTIL  HFA;VENTOLIN  HFA) 108 (90 BASE) MCG/ACT inhaler, Inhale 2 puffs into the lungs every 6 (six) hours as needed for wheezing or shortness of breath. Shortness of breath, Disp: , Rfl:    aspirin -acetaminophen -caffeine (EXCEDRIN MIGRAINE) 250-250-65 MG tablet, Take 2 tablets by mouth every 6 (six) hours as needed for pain., Disp: , Rfl:    atenolol  (TENORMIN ) 100 MG tablet, Take 100 mg by mouth daily., Disp: , Rfl:    cetirizine (ZYRTEC) 10 MG tablet, Take 10 mg by mouth daily., Disp: , Rfl:    Cholecalciferol (VITAMIN D) 2000 UNITS CAPS, Take 2,000 Units by mouth daily., Disp: , Rfl:    diclofenac  (VOLTAREN ) 75 MG EC tablet, Take 1 tablet (75 mg total) by mouth 2 (two) times daily., Disp: 30 tablet, Rfl: 2   dicyclomine  (BENTYL ) 20 MG tablet, Take 1 tablet (20 mg total) by mouth 2 (two) times daily., Disp:  20 tablet, Rfl: 0   diphenhydrAMINE  (BENADRYL ) 25 mg capsule, Take 25 mg by mouth every 6 (six) hours as needed., Disp: , Rfl:    Ferrous Gluconate (IRON) 240 (27 FE) MG TABS, Take 240 mg by mouth daily. , Disp: , Rfl:    fluticasone  (FLONASE ) 50 MCG/ACT nasal spray, Place 2 sprays into the nose as needed for allergies. congestion, Disp: , Rfl:    gabapentin  (NEURONTIN ) 600 MG tablet, Take 600 mg by mouth 3 (three) times daily., Disp: , Rfl:    glipiZIDE  (GLUCOTROL  XL) 10 MG 24 hr tablet, Take by mouth. Take 1 tablet (10mg ) by mouth when your blood sugar is over 200, Disp: , Rfl:    glucose blood (ACCU-CHEK AVIVA PLUS) test strip, , Disp: , Rfl:    ibuprofen  (ADVIL ,MOTRIN ) 600 MG tablet, Take 600 mg by mouth every 6 (six) hours as needed for headache., Disp: , Rfl:    JANUVIA 100 MG tablet, Take 100 mg by mouth daily., Disp: , Rfl:    lidocaine  (XYLOCAINE ) 2 % solution, Use as directed 15 mLs in the mouth or throat as needed for mouth pain., Disp: 200 mL, Rfl: 0   methocarbamol (ROBAXIN) 750 MG tablet, Take 750 mg by mouth as needed., Disp: , Rfl:    Multiple Vitamin (MULTIVITAMIN WITH MINERALS) TABS tablet, Take 1 tablet by mouth daily., Disp: , Rfl:    Oxycodone  HCl 20 MG TABS, Take 20 mg by mouth every 4 (four) hours., Disp: , Rfl: 0   triamterene -hydrochlorothiazide  (MAXZIDE -25) 37.5-25 MG per tablet, Take 1 tablet by  mouth daily. , Disp: , Rfl:    beclomethasone (QVAR) 80 MCG/ACT inhaler, Inhale into the lungs as needed., Disp: , Rfl:    cyclobenzaprine (FEXMID) 7.5 MG tablet, Take 7.5 mg by mouth 3 (three) times daily., Disp: , Rfl:    diclofenac  Sodium (VOLTAREN ) 1 % GEL, Apply topically as needed., Disp: , Rfl:    esomeprazole  (NEXIUM ) 40 MG capsule, Take 1 capsule (40 mg total) by mouth daily before breakfast., Disp: 30 capsule, Rfl: 2   estradiol (ESTRACE) 0.1 MG/GM vaginal cream, Place vaginally at bedtime., Disp: , Rfl:    Allergies  Allergen Reactions   Avapro [Irbesartan] Other  (See Comments)    ANGIOEDEMA   Codeine    Lisinopril Other (See Comments)    ANGIOEDEMA   Statins     Myalgia   Latex Swelling and Rash    Past Medical History:  Diagnosis Date   Ankle edema BILATERAL   takes Lasix  daily   Arthritis    BACK, KNEE, HIPS   Asthma    Cancer (HCC)    Cataracts, bilateral    immature   Chronic back pain    d/t injury in 1995   Chronic bronchitis    Chronic pain syndrome    COPD (chronic obstructive pulmonary disease) (HCC) CHRONIC BRONCHITIS --  LAST BOUT  BILATERAL PNEUMONIA 2009   uses QVAR daily and Albuterol  daily as needed   Diabetes mellitus without complication (HCC)    takes Glipizide  and Metformin daily   Diverticulosis    Fibromyalgia    Frequency of urination    GERD (gastroesophageal reflux disease)    takes Nexium  daily    H/O hiatal hernia    Headache    frequently but related to sinus   Hepatitis    hx of.Not sure if A,B,C   History of blood transfusion    no abnormal reaction noted   History of colon polyps    benign   History of gastric ulcer    History of glaucoma STATES TREATED FOR ABOUT 5 YRS  AGO  --- NO ISSUES SINCE   Hyperlipidemia    was on meds but has been off x 2 months   Hypertension    takes Atenlol and Triamtere  daily    Iron deficiency anemia    takes Iron pill daily   Joint pain    Joint swelling    Nocturia    Peripheral neuropathy    takes Gabapentin  daily   Pneumonia 2010   hx of   PTSD (post-traumatic stress disorder)    hx of-no meds   Seasonal allergies    takes Zyrtec daily and uses Flonase  daily as needed   Stress incontinence, female    Urgency of urination    Vitamin D deficiency    takes Vit D daily     Past Surgical History:  Procedure Laterality Date   ABDOMINAL HYSTERECTOMY     July 2013   BILATERAL BENIGN BREAST TUMOR REMOVED  YRS AGO   BREAST SURGERY     2 cyst removed from right breast and tumor removed from left breast   CARDIAC CATHETERIZATION  09-09-2008    NORMAL CORONARY ARTERIES/ NORMAL LVSF/ CHRONIC RENAL INSUFF.   COLONOSCOPY     CYSTO WITH HYDRODISTENSION  01/07/2012   Procedure: CYSTOSCOPY/HYDRODISTENSION;  Surgeon: Glendia DELENA Elizabeth, MD;  Location: [facility];  Service: Urology;  Laterality: N/A;   OF BLADDER WITH INSTILLATION OF MARCAINE  AND PRYDIUM   MUSCLE BIOPSY  Left 10/04/2015   Procedure: LEFT Vastus Lateralis Muscle Biopsy;  Surgeon: Rockey Peru, MD;  Location: [facility];  Service: Neurosurgery;  Laterality: Left;  LEFT vastus lateralis muscle biopsy   neck cyst     TUBAL LIGATION  YRS AGO    Family History  Problem Relation Age of Onset   Cancer Mother    Diabetes Mother    Hypertension Mother    Rheum arthritis Father    Cancer Sister    Hypertension Sister    Hypertension Son    Heart Problems Maternal Grandfather     Social History   Tobacco Use   Smoking status: Former    Current packs/day: 1.00    Average packs/day: 1 pack/day for 27.0 years (27.0 ttl pk-yrs)    Types: Cigarettes   Smokeless tobacco: Never   Tobacco comments:    quit smoking in 1993  Vaping Use   Vaping status: Never Used  Substance Use Topics   Alcohol use: No    Comment: quit drinking 25+yrs ago   Drug use: No    Comment: nothing in 25+yrs     ROS Refer to HPI for ROS details.  Objective:   Vitals: BP (!) 185/103 (BP Location: Left Arm)   Pulse 76   Temp 99.2 F (37.3 C) (Oral)   Resp 20   SpO2 95%   Physical Exam Vitals and nursing note reviewed.  Constitutional:      General: She is not in acute distress.    Appearance: She is well-developed. She is not ill-appearing or toxic-appearing.  HENT:     Head: Normocephalic and atraumatic.  Cardiovascular:     Rate and Rhythm: Normal rate.  Pulmonary:     Effort: Pulmonary effort is normal. No respiratory distress.  Musculoskeletal:     Right knee: Swelling and bony tenderness present. Decreased range of motion. Tenderness present. Normal pulse.      Left knee: Swelling and bony tenderness present. Decreased range of motion. Tenderness present. Normal pulse.     Right ankle: Swelling present. No tenderness. Decreased range of motion. Normal pulse.     Right Achilles Tendon: Normal.     Left ankle: Swelling present. No tenderness. Decreased range of motion. Normal pulse.     Left Achilles Tendon: Normal.  Skin:    General: Skin is warm and dry.  Neurological:     General: No focal deficit present.     Mental Status: She is alert and oriented to person, place, and time.  Psychiatric:        Mood and Affect: Mood normal.        Behavior: Behavior normal.     Procedures  No results found for this or any previous visit (from the past 24 hours).  No results found.   Assessment and Plan :     Discharge Instructions       1. Primary osteoarthritis of both knees (Primary) - esomeprazole  (NEXIUM ) 40 MG capsule; Take 1 capsule (40 mg total) by mouth daily before breakfast.  Dispense: 30 capsule; Refill: 2 - diclofenac  (VOLTAREN ) 75 MG EC tablet; Take 1 tablet (75 mg total) by mouth 2 (two) times daily.  Dispense: 30 tablet; Refill: 2 - Follow-up with primary care provider for further evaluation and ongoing management of osteoarthritis of bilateral knees as well as other medical concerns.      Teniyah Seivert B Niyati Heinke   Mathius Birkeland, [place] B, [place] 07/19/24 2111

## 2024-07-19 NOTE — ED Triage Notes (Signed)
 Patient is here for bilateral knee pain and swelling for several months. Patient has a PCP appt in September.

## 2024-08-27 ENCOUNTER — Encounter (HOSPITAL_COMMUNITY): Payer: Self-pay

## 2024-08-27 ENCOUNTER — Ambulatory Visit (HOSPITAL_COMMUNITY)
Admission: EM | Admit: 2024-08-27 | Discharge: 2024-08-27 | Disposition: A | Discharge status: Home / Self Care | Attending: Internal Medicine | Admitting: Internal Medicine

## 2024-08-27 DIAGNOSIS — R29898 Other symptoms and signs involving the musculoskeletal system: Secondary | ICD-10-CM | POA: Insufficient documentation

## 2024-08-27 DIAGNOSIS — R42 Dizziness and giddiness: Secondary | ICD-10-CM | POA: Insufficient documentation

## 2024-08-27 DIAGNOSIS — R1031 Right lower quadrant pain: Secondary | ICD-10-CM | POA: Insufficient documentation

## 2024-08-27 DIAGNOSIS — N764 Abscess of vulva: Secondary | ICD-10-CM | POA: Insufficient documentation

## 2024-08-27 DIAGNOSIS — H6123 Impacted cerumen, bilateral: Secondary | ICD-10-CM | POA: Diagnosis present

## 2024-08-27 LAB — POCT URINALYSIS DIP (MANUAL ENTRY)
Bilirubin, UA: NEGATIVE
Blood, UA: NEGATIVE
Glucose, UA: NEGATIVE mg/dL
Ketones, POC UA: NEGATIVE mg/dL
Leukocytes, UA: NEGATIVE
Nitrite, UA: NEGATIVE
Protein Ur, POC: NEGATIVE mg/dL
Spec Grav, UA: 1.025 (ref 1.010–1.025)
Urobilinogen, UA: 0.2 U/dL
pH, UA: 5 (ref 5.0–8.0)

## 2024-08-27 LAB — COMPREHENSIVE METABOLIC PANEL WITH GFR
ALT: 24 U/L (ref 0–44)
AST: 34 U/L (ref 15–41)
Albumin: 3.7 g/dL (ref 3.5–5.0)
Alkaline Phosphatase: 72 U/L (ref 38–126)
Anion gap: 12 (ref 5–15)
BUN: 18 mg/dL (ref 8–23)
CO2: 25 mmol/L (ref 22–32)
Calcium: 9.1 mg/dL (ref 8.9–10.3)
Chloride: 102 mmol/L (ref 98–111)
Creatinine, Ser: 0.85 mg/dL (ref 0.44–1.00)
GFR, Estimated: >60 mL/min (ref >60)
Glucose, Bld: 92 mg/dL (ref 70–99)
Potassium: 3.9 mmol/L (ref 3.5–5.1)
Sodium: 139 mmol/L (ref 135–145)
Total Bilirubin: 0.3 mg/dL (ref 0.0–1.2)
Total Protein: 7 g/dL (ref 6.5–8.1)

## 2024-08-27 LAB — CBC
HCT: 33.3 % — ABNORMAL LOW (ref 36.0–46.0)
Hemoglobin: 10.8 g/dL — ABNORMAL LOW (ref 12.0–15.0)
MCH: 29 pg (ref 26.0–34.0)
MCHC: 32.4 g/dL (ref 30.0–36.0)
MCV: 89.5 fL (ref 80.0–100.0)
Platelets: 176 K/uL (ref 150–400)
RBC: 3.72 MIL/uL — ABNORMAL LOW (ref 3.87–5.11)
RDW: 14.1 % (ref 11.5–15.5)
WBC: 5.2 K/uL (ref 4.0–10.5)
nRBC: 0 % (ref 0.0–0.2)

## 2024-08-27 MED ORDER — DOXYCYCLINE HYCLATE 100 MG PO CAPS
100.0000 mg | ORAL_CAPSULE | Freq: Two times a day (BID) | ORAL | 0 refills | Status: AC
Start: 2024-08-27 — End: 2024-09-03

## 2024-08-27 NOTE — ED Triage Notes (Signed)
 Patient presenting due to a vaginal mass that had burst 3 days ago. States since then having increased lower body pain, leg weakness, and balance issues.

## 2024-08-27 NOTE — ED Provider Notes (Signed)
 [facility]    CSN: [id number] Arrival date & time: 08/27/24  1249      History   Chief Complaint Chief Complaint  Patient presents with   Groin Swelling   Extremity Weakness    HPI [name] is a 73 y.o. female.   73 year old female presents urgent care with several complaints.  She reports that she had abscesses in the labial regions bilateral.  She reports that these ruptured about 3 days ago.  The area on the left side seems to be completely better however they are on the right side it is still tender.  She has only had a little bit of drainage in the last day.  She reports that shortly after this she also developed some lower back pain as well as pain and weakness in the lower extremities.  She has had this in the past.  She has not had her legs completely give out but they do feel more weak than usual.  She has been wearing knee braces to help with this.  She has a history of incontinence but this has not been worse than usual.  She has also noted some dizziness but this has been on and off since the last time she saw her primary care provider.  She had lab work at that time but did not develop the dizziness symptoms until after her visit.  Her lab work at that time is not available for us  to review as it was at an outside facility.  She does have a history of having dizziness symptoms in the past but does not take any medication for it.  She relates that she has had vertigo.  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  She does also relate that she has had some difficulty hearing recently and thinks she may have buildup of earwax.  She has chronic lower extremity edema due to lymphedema.    Extremity Weakness Pertinent negatives include no chest pain, no abdominal pain and no shortness of breath.    Past Medical History:  Diagnosis Date   Ankle edema BILATERAL   takes Lasix  daily   Arthritis     BACK, KNEE, HIPS   Asthma    Cancer (HCC)    Cataracts, bilateral    immature   Chronic back pain    d/t injury in 1995   Chronic bronchitis    Chronic pain syndrome    COPD (chronic obstructive pulmonary disease) (HCC) CHRONIC BRONCHITIS --  LAST BOUT  BILATERAL PNEUMONIA 2009   uses QVAR daily and Albuterol  daily as needed   Diabetes mellitus without complication (HCC)    takes Glipizide  and Metformin daily   Diverticulosis    Fibromyalgia    Frequency of urination    GERD (gastroesophageal reflux disease)    takes Nexium  daily    H/O hiatal hernia    Headache    frequently but related to sinus   Hepatitis    hx of.Not sure if A,B,C   History of blood transfusion    no abnormal reaction noted   History of colon polyps    benign   History of gastric ulcer    History of glaucoma STATES TREATED FOR ABOUT 5 YRS  AGO  --- NO ISSUES SINCE   Hyperlipidemia    was on meds but has been off x 2 months   Hypertension    takes Atenlol and Triamtere  daily  Iron deficiency anemia    takes Iron pill daily   Joint pain    Joint swelling    Nocturia    Peripheral neuropathy    takes Gabapentin  daily   Pneumonia 2010   hx of   PTSD (post-traumatic stress disorder)    hx of-no meds   Seasonal allergies    takes Zyrtec daily and uses Flonase  daily as needed   Stress incontinence, female    Urgency of urination    Vitamin D deficiency    takes Vit D daily    Patient Active Problem List   Diagnosis Date Noted   Claudication in peripheral vascular disease (HCC) 11/10/2020   Bilateral lower extremity edema 11/10/2020   Thoracic aortic aneurysm without rupture (HCC) 11/10/2020   Aortic atherosclerosis (HCC) 09/07/2020   Chest pain 01/18/2014   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    History of Bell's palsy    Pelvic pain in female    Stress incontinence, female    Nocturia    History of glaucoma    Uterine fibroid    Hyperlipidemia    COPD (chronic obstructive pulmonary  disease) (HCC)    Iron deficiency anemia    H/O hiatal hernia    Fibromyalgia    Arthritis    Chronic renal insufficiency    Diabetes mellitus with coincident hypertension [facility])     Past Surgical History:  Procedure Laterality Date   ABDOMINAL HYSTERECTOMY     July 2013   BILATERAL BENIGN BREAST TUMOR REMOVED  YRS AGO   BREAST SURGERY     2 cyst removed from right breast and tumor removed from left breast   CARDIAC CATHETERIZATION  09-09-2008   NORMAL CORONARY ARTERIES/ NORMAL LVSF/ CHRONIC RENAL INSUFF.   COLONOSCOPY     CYSTO WITH HYDRODISTENSION  01/07/2012   Procedure: CYSTOSCOPY/HYDRODISTENSION;  Surgeon: Glendia DELENA Lamiah Marmol, MD;  Location: [facility];  Service: Urology;  Laterality: N/A;   OF BLADDER WITH INSTILLATION OF MARCAINE  AND PRYDIUM   MUSCLE BIOPSY Left 10/04/2015   Procedure: LEFT Vastus Lateralis Muscle Biopsy;  Surgeon: Rockey Peru, MD;  Location: [facility];  Service: Neurosurgery;  Laterality: Left;  LEFT vastus lateralis muscle biopsy   neck cyst     TUBAL LIGATION  YRS AGO    OB History     Gravida  5   Para  5   Term  5   Preterm      AB      Living  5      SAB      IAB      Ectopic      Multiple      Live Births               Home Medications    Prior to Admission medications   Medication Sig Start Date End Date Taking? Authorizing Provider  albuterol  (PROVENTIL  HFA;VENTOLIN  HFA) 108 (90 BASE) MCG/ACT inhaler Inhale 2 puffs into the lungs every 6 (six) hours as needed for wheezing or shortness of breath. Shortness of breath   Yes [provider]  aspirin -acetaminophen -caffeine (EXCEDRIN MIGRAINE) 250-250-65 MG tablet Take 2 tablets by mouth every 6 (six) hours as needed for pain.   Yes [provider]  atenolol  (TENORMIN ) 100 MG tablet Take 100 mg by mouth daily.   Yes [provider]  beclomethasone (QVAR) 80 MCG/ACT inhaler Inhale into the lungs as needed.   Yes [provider]  cetirizine (ZYRTEC) 10 MG tablet Take 10 mg by mouth daily.   Yes [provider]  Cholecalciferol (VITAMIN D) 2000 UNITS CAPS Take 2,000 Units by mouth daily.   Yes [provider]  cyclobenzaprine (FEXMID) 7.5 MG tablet Take 7.5 mg by mouth 3 (three) times daily. 04/19/22  Yes [provider]  diclofenac  (VOLTAREN ) 75 MG EC tablet Take 1 tablet (75 mg total) by mouth 2 (two) times daily. 07/19/24  Yes Reddick, Johnathan B, NP  diclofenac  Sodium (VOLTAREN ) 1 % GEL Apply topically as needed. 04/19/22  Yes [provider]  dicyclomine  (BENTYL ) 20 MG tablet Take 1 tablet (20 mg total) by mouth 2 (two) times daily. 09/21/23  Yes Cardama, Raynell Moder, MD  diphenhydrAMINE  (BENADRYL ) 25 mg capsule Take 25 mg by mouth every 6 (six) hours as needed.   Yes [provider]  doxycycline  (VIBRAMYCIN ) 100 MG capsule Take 1 capsule (100 mg total) by mouth 2 (two) times daily for 7 days. 08/27/24 09/03/24 Yes Amare Bail A, PA-C  esomeprazole  (NEXIUM ) 40 MG capsule Take 1 capsule (40 mg total) by mouth daily before breakfast. 07/19/24  Yes Reddick, Johnathan B, NP  estradiol (ESTRACE) 0.1 MG/GM vaginal cream Place vaginally at bedtime. 01/07/22  Yes [provider]  Ferrous Gluconate (IRON) 240 (27 FE) MG TABS Take 240 mg by mouth daily.    Yes [provider]  fluticasone  (FLONASE ) 50 MCG/ACT nasal spray Place 2 sprays into the nose as needed for allergies. congestion   Yes [provider]  gabapentin  (NEURONTIN ) 600 MG tablet Take 600 mg by mouth 3 (three) times daily.   Yes [provider]  glipiZIDE  (GLUCOTROL  XL) 10 MG 24 hr tablet Take by mouth. Take 1 tablet (10mg ) by mouth when your blood sugar is over 200   Yes [provider]  glucose blood (ACCU-CHEK AVIVA PLUS) test strip  08/30/16  Yes [provider]  ibuprofen  (ADVIL ,MOTRIN ) 600 MG tablet Take 600 mg by mouth every 6 (six) hours as needed  for headache.   Yes [provider]  JANUVIA 100 MG tablet Take 100 mg by mouth daily. 04/19/22  Yes [provider]  lidocaine  (XYLOCAINE ) 2 % solution Use as directed 15 mLs in the mouth or throat as needed for mouth pain. 09/21/17  Yes Delwyn Damien PARAS, PA-C  methocarbamol (ROBAXIN) 750 MG tablet Take 750 mg by mouth as needed. 06/13/20  Yes [provider]  Multiple Vitamin (MULTIVITAMIN WITH MINERALS) TABS tablet Take 1 tablet by mouth daily.   Yes [provider]  Oxycodone  HCl 20 MG TABS Take 20 mg by mouth every 4 (four) hours. 09/12/17  Yes [provider]  triamterene -hydrochlorothiazide  (MAXZIDE -25) 37.5-25 MG per tablet Take 1 tablet by mouth daily.  01/13/14  Yes [provider]  SYMBICORT 160-4.5 MCG/ACT inhaler  04/04/20 11/21/20  [provider]    Family History Family History  Problem Relation Age of Onset   Cancer Mother    Diabetes Mother    Hypertension Mother    Rheum arthritis Father    Cancer Sister    Hypertension Sister    Hypertension Son    Heart Problems Maternal Grandfather     Social History Social History   Tobacco Use   Smoking status: Former    Current packs/day: 1.00    Average packs/day: 1 pack/day for 27.0 years (27.0 ttl pk-yrs)    Types: Cigarettes   Smokeless tobacco: Never   Tobacco comments:  quit smoking in 1993  Vaping Use   Vaping status: Never Used  Substance Use Topics   Alcohol use: No    Comment: quit drinking 25+yrs ago   Drug use: No    Comment: nothing in 25+yrs      Allergies   Avapro [irbesartan], Codeine, Lisinopril, Statins, and Latex   Review of Systems Review of Systems  Constitutional:  Negative for chills and fever.  HENT:  Positive for hearing loss. Negative for ear pain and sore throat.   Eyes:  Negative for pain and visual disturbance.  Respiratory:  Negative for cough and shortness of breath.   Cardiovascular:  Positive for leg swelling  (chronic). Negative for chest pain and palpitations.  Gastrointestinal:  Negative for abdominal pain and vomiting.  Genitourinary:  Negative for dysuria and hematuria.  Musculoskeletal:  Positive for extremity weakness. Negative for arthralgias and back pain.  Skin:  Positive for wound. Negative for color change and rash.  Neurological:  Positive for dizziness and weakness (lower extremities). Negative for seizures and syncope.  All other systems reviewed and are negative.    Physical Exam Triage Vital Signs ED Triage Vitals  Encounter Vitals Group     BP 08/27/24 1428 (!) 168/84     Girls Systolic BP Percentile --      Girls Diastolic BP Percentile --      Boys Systolic BP Percentile --      Boys Diastolic BP Percentile --      Pulse Rate 08/27/24 1428 64     Resp 08/27/24 1428 16     Temp 08/27/24 1428 98.3 F (36.8 C)     Temp Source 08/27/24 1428 Oral     SpO2 08/27/24 1428 96 %     Weight 08/27/24 1428 252 lb (114.3 kg)     Height 08/27/24 1428 5' 10 (1.778 m)     Head Circumference --      Peak Flow --      Pain Score 08/27/24 1426 8     Pain Loc --      Pain Education --      Exclude from Growth Chart --    No data found.  Updated Vital Signs BP (!) 168/84 (BP Location: Left Arm)   Pulse 64   Temp 98.3 F (36.8 C) (Oral)   Resp 16   Ht 5' 10 (1.778 m)   Wt 252 lb (114.3 kg)   SpO2 96%   BMI 36.16 kg/m   Visual Acuity Right Eye Distance:   Left Eye Distance:   Bilateral Distance:    Right Eye Near:   Left Eye Near:    Bilateral Near:     Physical Exam Vitals and nursing note reviewed.  Constitutional:      General: She is not in acute distress.    Appearance: She is well-developed.  HENT:     Head: Normocephalic and atraumatic.     Right Ear: There is impacted cerumen.     Left Ear: There is impacted cerumen.     Nose: Nose normal.  Eyes:     Conjunctiva/sclera: Conjunctivae normal.     Pupils: Pupils are equal, round, and reactive to  light.  Cardiovascular:     Rate and Rhythm: Normal rate and regular rhythm.     Pulses:          Posterior tibial pulses are 2+ on the right side and 2+ on the left side.     Heart sounds: Normal  heart sounds. No murmur heard. Pulmonary:     Effort: Pulmonary effort is normal. No tachypnea or respiratory distress.     Breath sounds: Normal breath sounds. No decreased breath sounds or wheezing.  Abdominal:     Palpations: Abdomen is soft.     Tenderness: There is no abdominal tenderness.  Musculoskeletal:        General: No swelling.     Cervical back: Neck supple.     Right lower leg: 3+ Edema present.     Left lower leg: 3+ Edema present.  Skin:    General: Skin is warm and dry.     Capillary Refill: Capillary refill takes less than 2 seconds.  Neurological:     General: No focal deficit present.     Mental Status: She is alert and oriented to person, place, and time.     Motor: No weakness.     Gait: Gait normal.  Psychiatric:        Mood and Affect: Mood normal.        Behavior: Behavior normal.        Thought Content: Thought content normal.        Judgment: Judgment normal.      UC Treatments / Results  Labs (all labs ordered are listed, but only abnormal results are displayed) Labs Reviewed  COMPREHENSIVE METABOLIC PANEL WITH GFR  CBC  POCT URINALYSIS DIP (MANUAL ENTRY)    EKG   Radiology No results found.  Procedures Procedures (including critical care time)  Medications Ordered in UC Medications - No data to display  Initial Impression / Assessment and Plan / UC Course  I have reviewed the triage vital signs and the nursing notes.  Pertinent labs & imaging results that were available during my care of the patient were reviewed by me and considered in my medical decision making (see chart for details).     Right inguinal pain - Plan: POC urinalysis dipstick, POC urinalysis dipstick  Dizziness  Weakness of both lower extremities  Abscess of  right genital labia  Bilateral impacted cerumen   Symptoms in the labial region is consistent with a ruptured abscess.  Since there is still residual swelling in the area and pain we will treat with antibiotics by mouth.  We will treat with the following: Doxycycline  100 mg twice daily for 7 days. Take this with food.  This is an antibiotic  Urinalysis done today.  This does not show any signs of infectious process  Blood work done today including complete metabolic panel and complete blood count.  This will check for kidney function, liver function, electrolytes and blood counts to see if there is an acute abnormality causing the intermittent dizziness and lower extremity weakness.  These results will take 24 to 48 hours.  If there are any abnormalities that require further treatment we will contact you.  Recommend following up with your primary care provider due to the recurrent dizziness and lower extremity weakness symptoms.  If the symptoms worsen then recommend going to the emergency room for further evaluation  Irrigation done of both the ears today.  There is some residual wax still present but there is improvement in hearing.  Recommend picking up over-the-counter Debrox from the pharmacy which is a solution to help break up earwax.  Follow the package directions.  Final Clinical Impressions(s) / UC Diagnoses   Final diagnoses:  Right inguinal pain  Dizziness  Weakness of both lower extremities  Abscess of right genital  labia  Bilateral impacted cerumen     Discharge Instructions      Symptoms in the labial region is consistent with a ruptured abscess.  Since there is still residual swelling in the area and pain we will treat with antibiotics by mouth.  We will treat with the following: Doxycycline  100 mg twice daily for 7 days. Take this with food.  This is an antibiotic  Urinalysis done today.  This does not show any signs of infectious process  Blood work done today  including complete metabolic panel and complete blood count.  This will check for kidney function, liver function, electrolytes and blood counts to see if there is an acute abnormality causing the intermittent dizziness and lower extremity weakness.  These results will take 24 to 48 hours.  If there are any abnormalities that require further treatment we will contact you.  Recommend following up with your primary care provider due to the recurrent dizziness and lower extremity weakness symptoms.  If the symptoms worsen then recommend going to the emergency room for further evaluation  Irrigation done of both the ears today.  There is some residual wax still present but there is improvement in hearing.  Recommend picking up over-the-counter Debrox from the pharmacy which is a solution to help break up earwax.  Follow the package directions.     ED Prescriptions     Medication Sig Dispense Auth. Provider   doxycycline  (VIBRAMYCIN ) 100 MG capsule Take 1 capsule (100 mg total) by mouth 2 (two) times daily for 7 days. 14 capsule Teresa Almarie LABOR, [place]      PDMP not reviewed this encounter.   Teresa Almarie LABOR, [place] 08/27/24 1554

## 2024-08-27 NOTE — Discharge Instructions (Addendum)
 Symptoms in the labial region is consistent with a ruptured abscess.  Since there is still residual swelling in the area and pain we will treat with antibiotics by mouth.  We will treat with the following: Doxycycline  100 mg twice daily for 7 days. Take this with food.  This is an antibiotic  Urinalysis done today.  This does not show any signs of infectious process  Blood work done today including complete metabolic panel and complete blood count.  This will check for kidney function, liver function, electrolytes and blood counts to see if there is an acute abnormality causing the intermittent dizziness and lower extremity weakness.  These results will take 24 to 48 hours.  If there are any abnormalities that require further treatment we will contact you.  Recommend following up with your primary care provider due to the recurrent dizziness and lower extremity weakness symptoms.  If the symptoms worsen then recommend going to the emergency room for further evaluation  Irrigation done of both the ears today.  There is some residual wax still present but there is improvement in hearing.  Recommend picking up over-the-counter Debrox from the pharmacy which is a solution to help break up earwax.  Follow the package directions.

## 2024-08-30 ENCOUNTER — Ambulatory Visit: Payer: Self-pay

## 2025-01-26 ENCOUNTER — Other Ambulatory Visit (HOSPITAL_BASED_OUTPATIENT_CLINIC_OR_DEPARTMENT_OTHER): Payer: Self-pay | Admitting: Adult Health Nurse Practitioner

## 2025-01-26 DIAGNOSIS — M25551 Pain in right hip: Secondary | ICD-10-CM
